# Patient Record
Sex: Female | Born: 1985
Health system: Southern US, Community
[De-identification: ages and names within clinical notes are randomized; demographics above are authoritative.]

## PROBLEM LIST (undated history)

## (undated) DIAGNOSIS — C801 Malignant (primary) neoplasm, unspecified: Secondary | ICD-10-CM

## (undated) DIAGNOSIS — M879 Osteonecrosis, unspecified: Secondary | ICD-10-CM

## (undated) DIAGNOSIS — I639 Cerebral infarction, unspecified: Secondary | ICD-10-CM

## (undated) DIAGNOSIS — I10 Essential (primary) hypertension: Secondary | ICD-10-CM

## (undated) DIAGNOSIS — D649 Anemia, unspecified: Secondary | ICD-10-CM

## (undated) DIAGNOSIS — R519 Headache, unspecified: Secondary | ICD-10-CM

## (undated) DIAGNOSIS — D573 Sickle-cell trait: Secondary | ICD-10-CM

## (undated) HISTORY — DX: Sickle-cell trait: D57.3

## (undated) HISTORY — DX: Cerebral infarction, unspecified: I63.9

## (undated) HISTORY — DX: Malignant (primary) neoplasm, unspecified: C80.1

## (undated) HISTORY — PX: NO PAST SURGERIES: SHX2092

---

## 2015-08-14 DIAGNOSIS — I639 Cerebral infarction, unspecified: Secondary | ICD-10-CM

## 2015-08-14 DIAGNOSIS — C58 Malignant neoplasm of placenta: Secondary | ICD-10-CM

## 2015-08-14 HISTORY — PX: LIVER BIOPSY: SHX301

## 2015-08-14 HISTORY — PX: PORTA CATH INSERTION: CATH118285

## 2015-08-14 HISTORY — DX: Malignant neoplasm of placenta: C58

## 2015-08-14 HISTORY — PX: LUNG BIOPSY: SHX232

## 2015-08-14 HISTORY — DX: Cerebral infarction, unspecified: I63.9

## 2016-08-13 HISTORY — PX: PORT-A-CATH REMOVAL: SHX5289

## 2019-08-14 NOTE — L&D Delivery Note (Signed)
OB/GYN Faculty Practice Delivery Note  Janice Stewart is a 34 y.o. G3P1011 s/p vaginal delivery at [redacted]w[redacted]d. She was admitted for post-dates IOL.   ROM: 27h 29m with clear fluid with terminal mec GBS Status: negative Maximum Maternal Temperature: 101.44F (s/p amp + gent prior to delivery)  Labor Progress: Pt was admitted for post-dates IOL. Cytotec was administered and pt was then transitioned to pitocin. AROM for clear fluid and pt was noted to have complete cervical dilation with uncomplicated delivery as noted below. Pt received ampicillin and gentamycin prior to delivery given clinically diagnosis of chorioamnionitis prior to delivery given maternal fever to 101.44F.  Delivery Date/Time: 2205 Delivery: Called to room and patient was complete and pushing. Head delivered LOA. No nuchal cord present. Shoulder and body delivered in usual fashion. Infant with spontaneous cry, placed on mother's abdomen, dried and stimulated. Cord clamped x 2 after 1-minute delay, and cut by FOB under my direct supervision. Cord blood drawn. Placenta delivered spontaneously with gentle cord traction. Fundus firm with massage and Pitocin. Labia, perineum, vagina, and cervix were inspected, notable for first degree laceration x1.   Placenta: intact, sent to path given pt's h/o choriocarcinoma Complications: none Lacerations: 1st degree perineal laceration s/p repair in standard fashion with 3-0 vicryl EBL: 75 ml Analgesia: epidural  Infant: female  APGARs 8 & 9  weight per medical record  Guillermina [place], MD OB/GYN Fellow, Faculty Practice

## 2020-01-25 ENCOUNTER — Encounter: Payer: Self-pay | Admitting: *Deleted

## 2020-01-25 ENCOUNTER — Other Ambulatory Visit (HOSPITAL_COMMUNITY)
Admission: RE | Admit: 2020-01-25 | Discharge: 2020-01-25 | Disposition: A | Discharge status: Home / Self Care | Payer: Self-pay | Source: Ambulatory Visit | Attending: Obstetrics and Gynecology | Admitting: Obstetrics and Gynecology

## 2020-01-25 ENCOUNTER — Ambulatory Visit (INDEPENDENT_AMBULATORY_CARE_PROVIDER_SITE_OTHER): Payer: Self-pay | Admitting: Obstetrics and Gynecology

## 2020-01-25 ENCOUNTER — Other Ambulatory Visit: Payer: Self-pay

## 2020-01-25 ENCOUNTER — Encounter: Payer: Self-pay | Admitting: Obstetrics and Gynecology

## 2020-01-25 DIAGNOSIS — Z3A16 16 weeks gestation of pregnancy: Secondary | ICD-10-CM

## 2020-01-25 DIAGNOSIS — O0289 Other abnormal products of conception: Secondary | ICD-10-CM | POA: Insufficient documentation

## 2020-01-25 DIAGNOSIS — O0990 Supervision of high risk pregnancy, unspecified, unspecified trimester: Secondary | ICD-10-CM | POA: Insufficient documentation

## 2020-01-25 DIAGNOSIS — Z8759 Personal history of other complications of pregnancy, childbirth and the puerperium: Secondary | ICD-10-CM

## 2020-01-25 DIAGNOSIS — C58 Malignant neoplasm of placenta: Secondary | ICD-10-CM | POA: Insufficient documentation

## 2020-01-25 DIAGNOSIS — Z8673 Personal history of transient ischemic attack (TIA), and cerebral infarction without residual deficits: Secondary | ICD-10-CM

## 2020-01-25 MED ORDER — PRENATAL PLUS 27-1 MG PO TABS: 1.0000 | ORAL_TABLET | Freq: Every day | ORAL | 11 refills | Status: DC

## 2020-01-25 MED ORDER — PRENATAL PLUS 27-1 MG PO TABS: 1.0000 | ORAL_TABLET | Freq: Every day | ORAL | 6 refills | Status: DC

## 2020-01-25 MED ORDER — ASPIRIN EC 81 MG PO TBEC: 81.0000 mg | DELAYED_RELEASE_TABLET | Freq: Every day | ORAL | 2 refills | Status: DC

## 2020-01-25 NOTE — Progress Notes (Signed)
Subjective:  Janice Stewart is a 34 y.o. G4P1011 at 107w1dbeing seen today for first OB visit. EDD by second trimester U/S. [place] TSVD in the past without problems.H/O choriocarcinoma and CVA both in 2017. See notation in problem list. She is currently monitored for the following issues for this high-risk pregnancy and has Supervision of high risk pregnancy, antepartum; History of molar pregnancy; and History of CVA (cerebrovascular accident) on their problem list.  Patient reports no complaints.  Contractions: Not present. Vag. Bleeding: None.  Movement: Present. Denies leaking of fluid.   The following portions of the patient's history were reviewed and updated as appropriate: allergies, current medications, past family history, past medical history, past social history, past surgical history and problem list. Problem list updated.  Objective:   Vitals:   01/25/20 1406 01/25/20 1415  BP: 110/71   Pulse: 85   Weight: 196 lb 9.6 oz (89.2 kg)   Height:  '5\' 9"'  (1.753 m)    Fetal Status: Fetal Heart Rate (bpm): 150   Movement: Present     General:  Alert, oriented and cooperative. Patient is in no acute distress.  Skin: Skin is warm and dry. No rash noted.   Cardiovascular: Normal heart rate noted  Respiratory: Normal respiratory effort, no problems with respiration noted  Abdomen: Soft, gravid, appropriate for gestational age. Pain/Pressure: Present     Pelvic:  Cervical exam performed        Extremities: Normal range of motion.  Edema: None  Mental Status: Normal mood and affect. Normal behavior. Normal judgment and thought content.   Urinalysis:      Assessment and Plan:  Pregnancy: G4P1011 at 144w1d1. Supervision of high risk pregnancy, antepartum Prenatal care. Labs and genetic testing reviewed with pt. - [facility] AMB BABYSCRIPTS SCHEDULE OPTIMIZATION - Culture, OB Urine - CBC/D/Plt+RPR+Rh+ABO+Rub Ab... - [place] OB DETAIL +14 WK; Future - Genetic  Screening - Cytology - PAP( [facility]) - prenatal vitamin w/FE, FA (PRENATAL 1 + 1) 27-1 MG TABS tablet; Take 1 tablet by mouth daily at 12 noon.  Dispense: 30 tablet; Refill: 11  2. History of molar pregnancy See notes in problem list - Comp Met (CMET) - TSH - Protein / creatinine ratio, urine  3. History of CVA (cerebrovascular accident)  - Comp Met (CMET) - TSH - aspirin EC 81 MG tablet; Take 1 tablet (81 mg total) by mouth daily. Take after 12 weeks for prevention of preeclampsia later in pregnancy  Dispense: 300 tablet; Refill: 2 - Protein / creatinine ratio, urine  Preterm labor symptoms and general obstetric precautions including but not limited to vaginal bleeding, contractions, leaking of fluid and fetal movement were reviewed in detail with the patient. Please refer to After Visit Summary for other counseling recommendations.  Return in about 4 weeks (around 02/22/2020) for face to face, MD only, OB visit.   ErChancy MilroyMD

## 2020-01-25 NOTE — Patient Instructions (Addendum)
AREA PEDIATRIC/FAMILY PRACTICE PHYSICIANS  [place]outheast [place] ([address]) . [facility] o Chambliss, MD; Eniola, MD; Hale, MD; Hensel, MD; McDiarmid, MD; McIntyer, MD; Para Cossey, MD; Walden, MD o [address] [place], [place] [address] o [phone] o Mon-Fri 8:30-12:30, 1:30-5:00 o Providers come to see babies at [facility] o Accepting Medicaid . [facility] o Limited providers who accept newborns: Koirala, MD; Morrow, MD; Wolters, MD o [address], [place], [place] [address] o [phone] o Mon-Fri 8:00-5:30 o Babies seen by providers at [facility] o Does NOT accept Medicaid o Please call early in hospitalization for appointment (limited availability)  . [facility] o Mulberry, MD o [address] [place], [place] [address] o [phone] o Mon, Tue, Thur, Fri 8:30-5:00, Wed 10:00-7:00 (closed 1-2pm) o Babies seen by [facility] providers o Accepting Medicaid . [facility] - Pediatrician o Rubin, MD o [address], [place], [place] [address] o [phone] o Mon-Fri 8:30-5:00, Sat 8:30-12:00 o Provider comes to see babies at [facility] o Accepting Medicaid o Must have been referred from current patients or contacted office prior to delivery . [facility] ([facility]) o Brown, MD; Chandler, MD; Ettefagh, MD; Grant, MD; Lester, MD; McCormick, MD; McQueen, MD; Prose, MD; Simha, MD; Stanley, MD; Stryffeler, NP; Tebben, NP o [address], [place], [place] [address] o [phone] o Mon, Tue, Thur, Fri 8:30-5:30, Wed 9:30-5:30, Sat 8:30-12:30 o Babies seen by [facility] providers o Accepting Medicaid o Only accepting infants of first-time parents or siblings of current patients o Hospital discharge coordinator will make follow-up appointment . Jack Amos o [address],  [place], [place]  [address] o [phone]   Fax - [phone] . [facility] o [address], [place], [place]  [address] o Phone - [phone]   Fax - [phone] . Shilpa Gosrani o [address], [place], [place]  [address] o [phone]  [place] ([address]) . [facility] o Bates, MD; [place], MD; Cooper, Cox, MD; MD; Davis, MD; Dovico, MD; Ettefaugh, MD; Little, MD; Lowe, MD; Keiffer, MD; Melvin, MD; Sumner, MD; Williams, MD o [address], [place], [place] [address] o [phone] o Mon-Fri 8:30-5:00 (extended evenings Mon-Thur as needed), Sat-Sun 10:00-1:00 o Providers come to see babies at [facility] o Accepting Medicaid for families of first-time babies and families with all children in the household age 3 and under. Must register with office prior to making appointment (M-F only). . [facility] o Henson, NP; Knapp, MD; Lalonde, MD; Tysinger, PA o [address] [place], [place] [address] o [phone] o Mon-Fri 8:00-5:00 o Babies seen by providers at [facility] o Does NOT accept Medicaid/Commercial Insurance Only . [facility] ([facility])  o Artis, MD; Barnes, MD; Bratton, MD; Coccaro, MD; Lockett Gardner, MD; Kramer, MD; Marshall, MD; Netherton, MD; Poleto, MD; Skinner, MD o [address] [place], [place] [address] o [phone] o Mon-Fri 8:30-5:30, Sat (Oct.-Mar.) 9:00-1:00 o Babies seen by providers at [facility] o Accepting Medicaid  [place] ([address]) . [facility] o Reid, MD; Warner, MD o [address], [place], [place] [address] o [phone] o Mon-Fri 8:30-5:00, Sat 8:30-12:00 o Providers come to see babies at [facility] o Does NOT accept Medicaid . [facility] o Becker, PA; Hagler, MD; Scifres, PA; Sun, MD; Swayne, MD o [address],  [place], [place] [address] o [phone] o Mon-Fri 8:00-5:00 o Babies seen by providers at [facility] o Does NOT accept Medicaid o Only accepting babies of parents who are patients o Please call early in hospitalization for appointment (limited availability) . [facility] o Clark, MD; Frye, MD; Kelleher, MD; Mack, NP; Miller, MD; O'Keller, MD; Patterson, NP; Pudlo, MD; Puzio, MD; Thomas, MD; Tucker, MD; Twiselton, MD o [address], [place], [place] [address] o [phone] o Mon-Fri 8:00-5:00, Sat 9:00-12:00 o Providers come to see babies at [facility] o Does NOT accept Medicaid  [place] ([address]) . [facility] o Limited providers accepting new patients: Brake, NP; Wharton, PA o [address], [place], [place] [address] o [phone] o Mon-Fri 8:00-5:00 o Babies seen by providers at [facility] o Does NOT accept Medicaid o Only accepting babies of parents who are patients o Please call early in hospitalization for appointment (limited availability) . [facility] o Gay, MD; Quinlan, MD o [address] [place], [place] [address] o [phone] (press 1 to schedule appointment) o Mon-Fri 8:00-5:00 o Providers come to see babies at [facility] o Does NOT accept Medicaid . [facility] o Mazer, MD o [address] [place], [place] [address] o [phone] o Mon-Fri 8:30-5:00 (lunch 12:30-1:00), extended hours by appointment only Wed 5:00-6:30 o Babies seen by [facility] providers o Accepting Medicaid . [facility] o Banks, MD; Jordan, MD; Koberlein, MD o [address], [place], [place] [address] o [phone] o Mon-Fri 8:00-5:00 o Babies seen by [facility] providers o Does NOT accept Medicaid .  [facility] o Parker, MD; Hunter, MD; Wallace, DO o [address] [place], [place]  [address] o [phone] o Mon-Fri 8:00-5:00 o Babies seen by [facility] providers o Does NOT accept Medicaid . [facility] o Brandon, PA; Brecken, PA; Christy, NP; Dees, MD; DeClaire, MD; DeWeese, MD; Hansen, NP; Mills, NP; Parrish, NP; Smoot, NP; Summer, MD; Vapne, MD o [address] [place], [place] [address] o [phone] o Mon-Fri 8:30-5:00, Sat 10:00-1:00 o Providers come to see babies at [facility] o Does NOT accept Medicaid o Free prenatal information session Tuesdays at 4:45pm . [facility] o Bouska, MD; Gordon, PA; Jeffery, PA; Weber, PA o [address] [place] [address] o [phone] o Mon-Fri 7:30-5:30 o Babies seen by [facility] providers . [facility] o [address], [place], [place]  [address] o [phone]   Fax - [phone]  [place] ([address] & [address]) . [facility] o Reese, MD o [address] [place], [place] [address] o [phone] o Mon-Thur 8:00-6:00 o Providers come to see babies at [facility] o Accepting Medicaid . [facility] o Anderson, NP; Badger, MD; Beal, PA; Spencer, PA o [address] [place], [place] [address] o [phone] o Mon-Thur 7:30-7:30, Fri 7:30-4:30 o Babies seen by [facility] providers o Accepting Medicaid . [facility] o Agbuya, MD; Klett, NP; Romgoolam, MD o [address], [place],  [address] o [phone] o Mon-Fri 8:30-5:00, Sat 8:30-12:00 o Providers come to see babies at [facility] o Accepting Medicaid o Must have "Meet & Greet" appointment at office prior to delivery . [facility] ) o McCord,   MD; Juleen China, MD; Clydene Laming, [place] [address], [place], [place] [address] o [phone] o Mon-Wed 8:00-6:00, Thur-Fri 8:00-5:00, Sat 9:00-12:00 o Providers come to  see babies at [facility] o Does NOT accept Medicaid o Only accepting siblings of current patients . [facility] [place], [place], [place], [place]  [address] o [phone]   Fax [phone] . [facility] [place] [address], [place], [place]  [address] o [phone]   Fax - Morton Gorman [phone] & [phone]) . Therapist, music at [place], DO; [place], [address] [place], [place] [address] o [phone] o Mon-Fri 7:00-5:00 o Babies seen by [facility] providers o Does NOT accept Medicaid . [place], MD; [place], [place]; [place], [place], [place], [place] [address] o [phone] o Mon-Fri 8:00-5:00 o Babies seen by [facility] providers o Accepting Medicaid . Lamont, MD; [place], [place]; [place], NP; [place], [place], [place], [place] [address] o [phone] o Mon-Fri 8:00-5:00 o Babies seen by providers at [facility] [place] [phone]) . [facility], [place] o [address] [place], [place] [address] o [phone] o Mon-Fri 8:00-5:00 o Babies seen by [facility] providers o Does NOT accept Medicaid o Limited availability, please call early in hospitalization to schedule follow-up . [facility] Leilani Merl, PA; Maisie Fus, MD; [place], MD; [place], [place]; Jeannine Kitten, MD; [place], [place] [facility] [address], [place], [place] [address] o [phone] o Mon-Fri 8:30-5:00, Sat 9:00-12:00 o Babies seen by providers at [facility] o Accepting Medicaid o Please register online then schedule online or call office o [email or web address] . [place] ([facility]  [place]) Kristian Covey, NP; Dwyane Dee, MD; Leonidas Romberg, PA o [address] [place], [place], [place] [address] o [phone] o Mon-Fri 8:00-5:00 o Babies seen by providers at [facility] o Accepting Medicaid . [place] ([facility]) Dairl Ponder, MD; Rayvon Char, NP; Melina [place], MD o [address] [place], [place], [place] [address] o [phone] o Mon-Fri 8:00-5:30, Sat&Sun by appointment (phones open at 8:30) o Babies seen by [facility] providers o Accepting Medicaid o Must be a first-time baby or sibling of current patient . [place], [address], [place], [place]  [address] o [phone]   Fax - [phone]  [place] [phone] & [phone]) . [place], [place]; [place], [place]; Benjamine Mola, MD; [place], [place]; [place] Lark, MD o [address] [place], [place] [address] o [phone] o Mon-Thur 8:00-7:00, Fri 8:00-5:00, Sat 8:00-12:00, Sun 9:00-12:00 o Babies seen by [facility] providers o Accepting Medicaid . [facility], MD; Ruthann Cancer, MD; [facility], MD o [address], [place], [place] [address] o [phone] o Mon-Thur 8:00-5:00 o Babies seen by providers at [facility] o Accepting Medicaid . [facility] [place], MD; Coe[place]Goins, MD; Amedeo Plenty, MD; Bobby Rumpf, MD; List, MD; Lavonia Drafts, MD; Ruthann Cancer, MD; Selinda Eon, MD; Audie Box, MD; Jim Like, MD; Christie Nottingham, MD; Hubbard Hartshorn, MD; [place] Nunnery, MD o [address] [place], [place]  [address] o [phone] o Mon-Fri 8:00-5:30, Sat (Oct.-Mar.) 9:00-1:00 o Babies seen by providers at [facility] o Accepting Medicaid o Must fill out new patient packet, available online at [email or web address] . [place] ([facility]) Barnabas Lister, NP; [place] Kingfisher, NP; Claiborne Billings, NP; Rolla Plate, MD;  [place], [place]; Carola Rhine, MD; Tyron Russell, MD; Delia Chimes, NP o [address], [place], [place] [address] o [phone] o Mon-Thur 8:00-5:30, Fri 8:00-5:00 o Babies seen by providers at [place] [phone]) . [place], [place]; [place], MD; Dennard Schaumann, MD; [place], [place] o [address] [place], [place] [address] o [phone] o Mon-Fri 8:00-5:00 o Babies seen by providers at [place] [phone]) . [place] [facility] [place], [place]; Olen Pel, MD; [place], [place], [place], [place] [address] o [phone] o Mon-Fri 8:00-5:00 o Babies seen by providers at [facility] o Does NOT accept Medicaid o Limited appointment availability, please call early in hospitalization  . Therapist, music at [place], [place]; [place], [address], [place], [place] [address] o [phone] o Mon-Fri 8:00-5:00 o Babies seen by [facility] providers o Does NOT accept Medicaid . [facility] Su Grand, MD; Guy Sandifer, MD; [place], [place]; [place], [place] [address], [place], [place] [address] o [phone] o Mon-Fri 8:00-5:00 o After hours clinic [facility] [place], [place] [address]) [phone] Mon-Fri 5:00-8:00, Sat 12:00-6:00, Sun 10:00-4:00 o Babies seen by [facility] providers o Accepting Medicaid . [place] [facility] o [address], [place], [place]  [address] o [phone]   Fax - [phone]  [place] [phone]) . Therapist, music at [facility], MD o [address] [place], [place], [place] [address] o [phone] o Mon-Fri 8:00-5:00 o Babies seen by [facility] providers o Does NOT accept Medicaid . [place] ([facility]) Bing Neighbors, MD o [address] [place], [place], [place]  [address] o [phone] o Mon-Thur 8:00-7:00, Fri 8:00-5:00, Sat 8:00-12:00 o Babies seen by providers at [facility] o Accepting Medicaid - but does not have vaccinations in office (must be received elsewhere) o Limited availability, please call early in hospitalization  [place] ([address]) . [place], MD o [address], [place] [address] o [phone]  Fax [phone]  Second Trimester of Pregnancy The second trimester is from week 14 through week 27 (months 4 through 6). The second trimester is often a time when you feel your best. Your body has adjusted to being pregnant, and you begin to feel better physically. Usually, morning sickness has lessened or quit completely, you may have more energy, and you may have an increase in appetite. The second trimester is also a time when the fetus is growing rapidly. At the end of the sixth month, the fetus is about 9 inches long and weighs about 1 pounds. You will likely begin to feel the baby move (quickening) between 16 and 20 weeks of pregnancy. Body changes during your second trimester Your body continues to go through many changes during your second trimester. The changes vary from woman to woman. Your weight will continue to increase. You will notice your lower abdomen bulging out. You may begin to get stretch marks on your  hips, abdomen, and breasts. You may develop headaches that can be relieved by medicines. The medicines should be approved by your health care provider. You may urinate more often because the fetus is pressing on your bladder. You may develop or continue to have heartburn as a result of your pregnancy. You may develop constipation because certain hormones are causing the muscles that push waste through your intestines to slow down. You may develop hemorrhoids or swollen, bulging veins (varicose veins). You may have back pain. This is caused by: Weight gain. Pregnancy  hormones that are relaxing the joints in your pelvis. A shift in weight and the muscles that support your balance. Your breasts will continue to grow and they will continue to become tender. Your gums may bleed and may be sensitive to brushing and flossing. Dark spots or blotches (chloasma, mask of pregnancy) may develop on your face. This will likely fade after the baby is born. A dark line from your belly button to the pubic area (linea nigra) may appear. This will likely fade after the baby is born. You may have changes in your hair. These can include thickening of your hair, rapid growth, and changes in texture. Some women also have hair loss during or after pregnancy, or hair that feels dry or thin. Your hair will most likely return to normal after your baby is born. What to expect at prenatal visits During a routine prenatal visit: You will be weighed to make sure you and the fetus are growing normally. Your blood pressure will be taken. Your abdomen will be measured to track your baby's growth. The fetal heartbeat will be listened to. Any test results from the previous visit will be discussed. Your health care provider may ask you: How you are feeling. If you are feeling the baby move. If you have had any abnormal symptoms, such as leaking fluid, bleeding, severe headaches, or abdominal cramping. If you are using any tobacco products, including cigarettes, chewing tobacco, and electronic cigarettes. If you have any questions. Other tests that may be performed during your second trimester include: Blood tests that check for: Low iron levels (anemia). High blood sugar that affects pregnant women (gestational diabetes) between 61 and 28 weeks. Rh antibodies. This is to check for a protein on red blood cells (Rh factor). Urine tests to check for infections, diabetes, or protein in the urine. An ultrasound to confirm the proper growth and development of the baby. An amniocentesis to  check for possible genetic problems. Fetal screens for spina bifida and Down syndrome. HIV (human immunodeficiency virus) testing. Routine prenatal testing includes screening for HIV, unless you choose not to have this test. Follow these instructions at home: Medicines Follow your health care provider's instructions regarding medicine use. Specific medicines may be either safe or unsafe to take during pregnancy. Take a prenatal vitamin that contains at least 600 micrograms (mcg) of folic acid. If you develop constipation, try taking a stool softener if your health care provider approves. Eating and drinking  Eat a balanced diet that includes fresh fruits and vegetables, whole grains, good sources of protein such as meat, eggs, or tofu, and low-fat dairy. Your health care provider will help you determine the amount of weight gain that is right for you. Avoid raw meat and uncooked cheese. These carry germs that can cause birth defects in the baby. If you have low calcium intake from food, talk to your health care provider about whether you should take a daily calcium supplement.  Limit foods that are high in fat and processed sugars, such as fried and sweet foods. To prevent constipation: Drink enough fluid to keep your urine clear or pale yellow. Eat foods that are high in fiber, such as fresh fruits and vegetables, whole grains, and beans. Activity Exercise only as directed by your health care provider. Most women can continue their usual exercise routine during pregnancy. Try to exercise for 30 minutes at least 5 days a week. Stop exercising if you experience uterine contractions. Avoid heavy lifting, wear low heel shoes, and practice good posture. A sexual relationship may be continued unless your health care provider directs you otherwise. Relieving pain and discomfort Wear a good support bra to prevent discomfort from breast tenderness. Take warm sitz baths to soothe any pain or discomfort  caused by hemorrhoids. Use hemorrhoid cream if your health care provider approves. Rest with your legs elevated if you have leg cramps or low back pain. If you develop varicose veins, wear support hose. Elevate your feet for 15 minutes, 3-4 times a day. Limit salt in your diet. Prenatal Care Write down your questions. Take them to your prenatal visits. Keep all your prenatal visits as told by your health care provider. This is important. Safety Wear your seat belt at all times when driving. Make a list of emergency phone numbers, including numbers for family, friends, the hospital, and police and fire departments. General instructions Ask your health care provider for a referral to a local prenatal education class. Begin classes no later than the beginning of month 6 of your pregnancy. Ask for help if you have counseling or nutritional needs during pregnancy. Your health care provider can offer advice or refer you to specialists for help with various needs. Do not use hot tubs, steam rooms, or saunas. Do not douche or use tampons or scented sanitary pads. Do not cross your legs for long periods of time. Avoid cat litter boxes and soil used by cats. These carry germs that can cause birth defects in the baby and possibly loss of the fetus by miscarriage or stillbirth. Avoid all smoking, herbs, alcohol, and unprescribed drugs. Chemicals in these products can affect the formation and growth of the baby. Do not use any products that contain nicotine or tobacco, such as cigarettes and e-cigarettes. If you need help quitting, ask your health care provider. Visit your dentist if you have not gone yet during your pregnancy. Use a soft toothbrush to brush your teeth and be gentle when you floss. Contact a health care provider if: You have dizziness. You have mild pelvic cramps, pelvic pressure, or nagging pain in the abdominal area. You have persistent nausea, vomiting, or diarrhea. You have a bad  smelling vaginal discharge. You have pain when you urinate. Get help right away if: You have a fever. You are leaking fluid from your vagina. You have spotting or bleeding from your vagina. You have severe abdominal cramping or pain. You have rapid weight gain or weight loss. You have shortness of breath with chest pain. You notice sudden or extreme swelling of your face, hands, ankles, feet, or legs. You have not felt your baby move in over an hour. You have severe headaches that do not go away when you take medicine. You have vision changes. Summary The second trimester is from week 14 through week 27 (months 4 through 6). It is also a time when the fetus is growing rapidly. Your body goes through many changes during pregnancy. The changes  vary from woman to woman. Avoid all smoking, herbs, alcohol, and unprescribed drugs. These chemicals affect the formation and growth your baby. Do not use any tobacco products, such as cigarettes, chewing tobacco, and e-cigarettes. If you need help quitting, ask your health care provider. Contact your health care provider if you have any questions. Keep all prenatal visits as told by your health care provider. This is important. This information is not intended to replace advice given to you by your health care provider. Make sure you discuss any questions you have with your health care provider. Document Revised: 11/21/2018 Document Reviewed: 09/04/2016 [employer] Patient Education  [employer].

## 2020-01-26 LAB — HCV INTERPRETATION

## 2020-01-26 LAB — CBC/D/PLT+RPR+RH+ABO+RUB AB...
Antibody Screen: NEGATIVE
Basophils Absolute: 0 10*3/uL (ref 0.0–0.2)
Basos: 0 %
EOS (ABSOLUTE): 0.1 10*3/uL (ref 0.0–0.4)
Eos: 1 %
HCV Ab: <0.1 s/co ratio (ref 0.0–0.9)
HIV Screen 4th Generation wRfx: NONREACTIVE
Hematocrit: 30.3 % — ABNORMAL LOW (ref 34.0–46.6)
Hemoglobin: 10.1 g/dL — ABNORMAL LOW (ref 11.1–15.9)
Hepatitis B Surface Ag: NEGATIVE
Immature Grans (Abs): 0.1 10*3/uL (ref 0.0–0.1)
Immature Granulocytes: 1 %
Lymphocytes Absolute: 1.5 10*3/uL (ref 0.7–3.1)
Lymphs: 18 %
MCH: 28.1 pg (ref 26.6–33.0)
MCHC: 33.3 g/dL (ref 31.5–35.7)
MCV: 84 fL (ref 79–97)
Monocytes Absolute: 0.5 10*3/uL (ref 0.1–0.9)
Monocytes: 7 %
Neutrophils Absolute: 6 10*3/uL (ref 1.4–7.0)
Neutrophils: 73 %
Platelets: 252 10*3/uL (ref 150–450)
RBC: 3.6 x10E6/uL — ABNORMAL LOW (ref 3.77–5.28)
RDW: 13.2 % (ref 11.7–15.4)
RPR Ser Ql: NONREACTIVE
Rh Factor: POSITIVE
Rubella Antibodies, IGG: 4.85 index (ref >0.99)
WBC: 8.2 10*3/uL (ref 3.4–10.8)

## 2020-01-26 LAB — COMPREHENSIVE METABOLIC PANEL
ALT: 10 IU/L (ref 0–32)
AST: 15 IU/L (ref 0–40)
Albumin/Globulin Ratio: 1.4 (ref 1.2–2.2)
Albumin: 4 g/dL (ref 3.8–4.8)
Alkaline Phosphatase: 58 IU/L (ref 48–121)
BUN/Creatinine Ratio: 7 — ABNORMAL LOW (ref 9–23)
BUN: 5 mg/dL — ABNORMAL LOW (ref 6–20)
Bilirubin Total: <0.2 mg/dL (ref 0.0–1.2)
CO2: 18 mmol/L — ABNORMAL LOW (ref 20–29)
Calcium: 9.2 mg/dL (ref 8.7–10.2)
Chloride: 103 mmol/L (ref 96–106)
Creatinine, Ser: 0.73 mg/dL (ref 0.57–1.00)
GFR calc Af Amer: 124 mL/min/{1.73_m2} (ref >59)
GFR calc non Af Amer: 108 mL/min/{1.73_m2} (ref >59)
Globulin, Total: 2.9 g/dL (ref 1.5–4.5)
Glucose: 81 mg/dL (ref 65–99)
Potassium: 4.2 mmol/L (ref 3.5–5.2)
Sodium: 136 mmol/L (ref 134–144)
Total Protein: 6.9 g/dL (ref 6.0–8.5)

## 2020-01-26 LAB — CYTOLOGY - PAP
Chlamydia: NEGATIVE
Comment: NEGATIVE
Comment: NEGATIVE
Comment: NORMAL
Diagnosis: NEGATIVE
High risk HPV: NEGATIVE
Neisseria Gonorrhea: NEGATIVE

## 2020-01-26 LAB — PROTEIN / CREATININE RATIO, URINE
Creatinine, Urine: 92.2 mg/dL
Protein, Ur: 8.8 mg/dL
Protein/Creat Ratio: 95 mg/g creat (ref 0–200)

## 2020-01-26 LAB — TSH: TSH: 0.452 u[IU]/mL (ref 0.450–4.500)

## 2020-01-27 LAB — CULTURE, OB URINE

## 2020-01-27 LAB — URINE CULTURE, OB REFLEX: Organism ID, Bacteria: NO GROWTH

## 2020-02-11 ENCOUNTER — Telehealth (INDEPENDENT_AMBULATORY_CARE_PROVIDER_SITE_OTHER): Payer: Self-pay | Admitting: Lactation Services

## 2020-02-11 ENCOUNTER — Encounter: Payer: Self-pay | Admitting: General Practice

## 2020-02-11 DIAGNOSIS — D573 Sickle-cell trait: Secondary | ICD-10-CM

## 2020-02-11 DIAGNOSIS — O99891 Other specified diseases and conditions complicating pregnancy: Secondary | ICD-10-CM

## 2020-02-11 DIAGNOSIS — O0990 Supervision of high risk pregnancy, unspecified, unspecified trimester: Secondary | ICD-10-CM

## 2020-02-11 NOTE — Telephone Encounter (Signed)
Called patient to let her know that her [facility] Results show that she is a carrier for Sickle Cell Disease. Patient reports she was aware and that her husband and daughter were tested in [place]. Mom is not aware of dad's results but reports her daughter is negative.   Gave mom the number to call [facility] at [phone] to set up a Genetic Counseling Session to discuss results. Patient voiced understanding.

## 2020-02-16 ENCOUNTER — Encounter: Payer: Self-pay | Admitting: General Practice

## 2020-02-22 ENCOUNTER — Encounter: Payer: Self-pay | Admitting: Obstetrics and Gynecology

## 2020-02-22 ENCOUNTER — Other Ambulatory Visit: Payer: Self-pay

## 2020-02-22 ENCOUNTER — Ambulatory Visit (INDEPENDENT_AMBULATORY_CARE_PROVIDER_SITE_OTHER): Payer: Self-pay | Admitting: Obstetrics and Gynecology

## 2020-02-22 VITALS — BP 115/66 | HR 72 | Wt 198.8 lb

## 2020-02-22 DIAGNOSIS — Z8673 Personal history of transient ischemic attack (TIA), and cerebral infarction without residual deficits: Secondary | ICD-10-CM

## 2020-02-22 DIAGNOSIS — O0992 Supervision of high risk pregnancy, unspecified, second trimester: Secondary | ICD-10-CM

## 2020-02-22 DIAGNOSIS — O0990 Supervision of high risk pregnancy, unspecified, unspecified trimester: Secondary | ICD-10-CM

## 2020-02-22 DIAGNOSIS — D573 Sickle-cell trait: Secondary | ICD-10-CM

## 2020-02-22 DIAGNOSIS — Z3A20 20 weeks gestation of pregnancy: Secondary | ICD-10-CM

## 2020-02-22 DIAGNOSIS — Z8759 Personal history of other complications of pregnancy, childbirth and the puerperium: Secondary | ICD-10-CM

## 2020-02-22 NOTE — Patient Instructions (Signed)

## 2020-02-22 NOTE — Progress Notes (Signed)
   PRENATAL VISIT NOTE  Subjective:  Janice Stewart is a 34 y.o. G4P1011 at [redacted]w[redacted]d being seen today for ongoing prenatal care.  She is currently monitored for the following issues for this high-risk pregnancy and has Supervision of high risk pregnancy, antepartum; History of molar pregnancy; History of CVA (cerebrovascular accident); and Sickle cell trait ([place]) on their problem list.  Patient doing well with no acute concerns today. She reports backache.  Contractions: Not present. Vag. Bleeding: None.  Movement: Present. Denies leaking of fluid.   The following portions of the patient's history were reviewed and updated as appropriate: allergies, current medications, past family history, past medical history, past social history, past surgical history and problem list. Problem list updated.  Objective:   Vitals:   02/22/20 0859  BP: 115/66  Pulse: 72  Weight: 198 lb 12.8 oz (90.2 kg)    Fetal Status: Fetal Heart Rate (bpm): 132   Movement: Present     General:  Alert, oriented and cooperative. Patient is in no acute distress.  Skin: Skin is warm and dry. No rash noted.   Cardiovascular: Normal heart rate noted  Respiratory: Normal respiratory effort, no problems with respiration noted  Abdomen: Soft, gravid, appropriate for gestational age.  Pain/Pressure: Present     Pelvic: Cervical exam deferred        Extremities: Normal range of motion.  Edema: None  Mental Status:  Normal mood and affect. Normal behavior. Normal judgment and thought content.   Assessment and Plan:  Pregnancy: G4P1011 at [redacted]w[redacted]d  1. Sickle cell trait ([place]) Per pt she has genetic consult pending  2. Supervision of high risk pregnancy, antepartum  - AMB referral to maternal fetal medicine  3. History of CVA (cerebrovascular accident) Pt is poor historian regarding  Her CVA.  She states it was before her chemotherapy.  She is not sure if it was related to clotting or hypertension.  Pt cannot remember  her treatment regimen as well. - AMB referral to maternal fetal medicine  4. History of molar pregnancy   Preterm labor symptoms and general obstetric precautions including but not limited to vaginal bleeding, contractions, leaking of fluid and fetal movement were reviewed in detail with the patient.  Please refer to After Visit Summary for other counseling recommendations.   Return in about 4 weeks (around 03/21/2020) for [facility], in person.   Lynnda Shields, MD

## 2020-02-26 ENCOUNTER — Ambulatory Visit: Payer: Self-pay | Attending: Obstetrics and Gynecology

## 2020-02-26 ENCOUNTER — Other Ambulatory Visit: Payer: Self-pay

## 2020-02-26 ENCOUNTER — Ambulatory Visit: Payer: Self-pay | Admitting: *Deleted

## 2020-02-26 VITALS — BP 126/67 | HR 87

## 2020-02-26 DIAGNOSIS — Z363 Encounter for antenatal screening for malformations: Secondary | ICD-10-CM

## 2020-02-26 DIAGNOSIS — Z862 Personal history of diseases of the blood and blood-forming organs and certain disorders involving the immune mechanism: Secondary | ICD-10-CM

## 2020-02-26 DIAGNOSIS — O0990 Supervision of high risk pregnancy, unspecified, unspecified trimester: Secondary | ICD-10-CM | POA: Insufficient documentation

## 2020-02-26 DIAGNOSIS — Z8759 Personal history of other complications of pregnancy, childbirth and the puerperium: Secondary | ICD-10-CM

## 2020-02-26 DIAGNOSIS — O09292 Supervision of pregnancy with other poor reproductive or obstetric history, second trimester: Secondary | ICD-10-CM

## 2020-02-26 DIAGNOSIS — Z3A20 20 weeks gestation of pregnancy: Secondary | ICD-10-CM

## 2020-03-24 ENCOUNTER — Ambulatory Visit (INDEPENDENT_AMBULATORY_CARE_PROVIDER_SITE_OTHER): Payer: Self-pay | Admitting: Family Medicine

## 2020-03-24 ENCOUNTER — Other Ambulatory Visit: Payer: Self-pay

## 2020-03-24 ENCOUNTER — Encounter: Payer: Self-pay | Admitting: Family Medicine

## 2020-03-24 VITALS — BP 124/66 | HR 84 | Wt 203.6 lb

## 2020-03-24 DIAGNOSIS — O0990 Supervision of high risk pregnancy, unspecified, unspecified trimester: Secondary | ICD-10-CM

## 2020-03-24 DIAGNOSIS — Z8673 Personal history of transient ischemic attack (TIA), and cerebral infarction without residual deficits: Secondary | ICD-10-CM

## 2020-03-24 NOTE — Patient Instructions (Signed)

## 2020-03-24 NOTE — Progress Notes (Signed)
   PRENATAL VISIT NOTE  Subjective:  Janice Stewart is a 34 y.o. G3P1011 at [redacted]w[redacted]d being seen today for ongoing prenatal care.  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  Patient reports no complaints.  Contractions: Not present. Vag. Bleeding: None.  Movement: Present. Denies leaking of fluid.   The following portions of the patient's history were reviewed and updated as appropriate: allergies, current medications, past family history, past medical history, past social history, past surgical history and problem list.   Objective:   Vitals:   03/24/20 0826  BP: 124/66  Pulse: 84  Weight: 203 lb 9.6 oz (92.4 kg)    Fetal Status: Fetal Heart Rate (bpm): 144 Fundal Height: 23 cm Movement: Present     General:  Alert, oriented and cooperative. Patient is in no acute distress.  Skin: Skin is warm and dry. No rash noted.   Cardiovascular: Normal heart rate noted  Respiratory: Normal respiratory effort, no problems with respiration noted  Abdomen: Soft, gravid, appropriate for gestational age.  Pain/Pressure: Present     Pelvic: Cervical exam deferred        Extremities: Normal range of motion.  Edema: None  Mental Status: Normal mood and affect. Normal behavior. Normal judgment and thought content.   Assessment and Plan:  Pregnancy: G3P1011 at [redacted]w[redacted]d 1. Supervision of high risk pregnancy, antepartum Continue prenatal care.   2. History of CVA (cerebrovascular accident) See problem list, treated with TPA Do not think she needs anti-coagulation as likely related to cancer diagnosis  Preterm labor symptoms and general obstetric precautions including but not limited to vaginal bleeding, contractions, leaking of fluid and fetal movement were reviewed in detail  with the patient. Please refer to After Visit Summary for other counseling recommendations.   Return in 4 weeks (on 04/21/2020) for [facility], in person, 28 wk labs.  No future appointments.  Donnamae Jude, MD

## 2020-04-12 ENCOUNTER — Encounter: Payer: Self-pay | Admitting: *Deleted

## 2020-04-21 ENCOUNTER — Other Ambulatory Visit: Payer: Self-pay

## 2020-04-21 ENCOUNTER — Ambulatory Visit (INDEPENDENT_AMBULATORY_CARE_PROVIDER_SITE_OTHER): Payer: Self-pay | Admitting: Obstetrics and Gynecology

## 2020-04-21 VITALS — BP 110/66 | HR 84 | Wt 214.0 lb

## 2020-04-21 DIAGNOSIS — Z8673 Personal history of transient ischemic attack (TIA), and cerebral infarction without residual deficits: Secondary | ICD-10-CM

## 2020-04-21 DIAGNOSIS — O0289 Other abnormal products of conception: Secondary | ICD-10-CM

## 2020-04-21 DIAGNOSIS — D573 Sickle-cell trait: Secondary | ICD-10-CM

## 2020-04-21 DIAGNOSIS — C58 Malignant neoplasm of placenta: Secondary | ICD-10-CM

## 2020-04-21 DIAGNOSIS — O0990 Supervision of high risk pregnancy, unspecified, unspecified trimester: Secondary | ICD-10-CM

## 2020-04-21 DIAGNOSIS — Z23 Encounter for immunization: Secondary | ICD-10-CM

## 2020-04-21 NOTE — Progress Notes (Signed)
   PRENATAL VISIT NOTE  Subjective:  Janice Stewart is a 34 y.o. G3P1011 at [redacted]w[redacted]d being seen today for ongoing prenatal care.  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  Patient doing well with no acute concerns today. She reports occasional leg cramps.  Contractions: Not present. Vag. Bleeding: None.  Movement: Present. Denies leaking of fluid.   The following portions of the patient's history were reviewed and updated as appropriate: allergies, current medications, past family history, past medical history, past social history, past surgical history and problem list. Problem list updated.  Objective:   Vitals:   04/21/20 0840  BP: 110/66  Pulse: 84  Weight: 214 lb (97.1 kg)    Fetal Status: Fetal Heart Rate (bpm): 140 Fundal Height: 28 cm Movement: Present     General:  Alert, oriented and cooperative. Patient is in no acute distress.  Skin: Skin is warm and dry. No rash noted.   Cardiovascular: Normal heart rate noted  Respiratory: Normal respiratory effort, no problems with respiration noted  Abdomen: Soft, gravid, appropriate for gestational age.  Pain/Pressure: Present     Pelvic: Cervical exam deferred        Extremities: Normal range of motion.  Edema: Trace  Mental Status:  Normal mood and affect. Normal behavior. Normal judgment and thought content.   Assessment and Plan:  Pregnancy: G3P1011 at [redacted]w[redacted]d  1. Supervision of high risk pregnancy, antepartum Pt received flu shot today  2. History of CVA (cerebrovascular accident) No anticoagulation per previous notes  3. Sickle cell trait ([place])   4. Gestational choriocarcinoma [facility]) Check hcg after delivery  Preterm labor symptoms and general obstetric precautions including but not limited to  vaginal bleeding, contractions, leaking of fluid and fetal movement were reviewed in detail with the patient.  Please refer to After Visit Summary for other counseling recommendations.   Return in about 2 weeks (around 05/05/2020) for virtual.   Lynnda Shields, MD

## 2020-04-21 NOTE — Addendum Note (Signed)
Addended by: Michel Harrow on: 04/21/2020 09:30 AM   Modules accepted: Orders

## 2020-04-21 NOTE — Patient Instructions (Signed)

## 2020-04-22 LAB — GLUCOSE TOLERANCE, 2 HOURS W/ 1HR
Glucose, 1 hour: 97 mg/dL (ref 65–179)
Glucose, 2 hour: 82 mg/dL (ref 65–152)
Glucose, Fasting: 73 mg/dL (ref 65–91)

## 2020-04-22 LAB — CBC
Hematocrit: 29.8 % — ABNORMAL LOW (ref 34.0–46.6)
Hemoglobin: 9.8 g/dL — ABNORMAL LOW (ref 11.1–15.9)
MCH: 28.9 pg (ref 26.6–33.0)
MCHC: 32.9 g/dL (ref 31.5–35.7)
MCV: 88 fL (ref 79–97)
Platelets: 223 x10E3/uL (ref 150–450)
RBC: 3.39 x10E6/uL — ABNORMAL LOW (ref 3.77–5.28)
RDW: 13.6 % (ref 11.7–15.4)
WBC: 11.3 x10E3/uL — ABNORMAL HIGH (ref 3.4–10.8)

## 2020-04-22 LAB — HIV ANTIBODY (ROUTINE TESTING W REFLEX): HIV Screen 4th Generation wRfx: NONREACTIVE

## 2020-04-22 LAB — RPR: RPR Ser Ql: NONREACTIVE

## 2020-04-25 ENCOUNTER — Telehealth: Payer: Self-pay | Admitting: *Deleted

## 2020-04-25 DIAGNOSIS — O26899 Other specified pregnancy related conditions, unspecified trimester: Secondary | ICD-10-CM

## 2020-04-25 DIAGNOSIS — O99019 Anemia complicating pregnancy, unspecified trimester: Secondary | ICD-10-CM

## 2020-04-25 MED ORDER — FERROUS SULFATE 325 (65 FE) MG PO TABS: 325.0000 mg | ORAL_TABLET | Freq: Every day | ORAL | 3 refills | Status: DC

## 2020-04-25 MED ORDER — FAMOTIDINE 20 MG PO TABS: 20.0000 mg | ORAL_TABLET | Freq: Two times a day (BID) | ORAL | 3 refills | Status: DC

## 2020-04-25 NOTE — Telephone Encounter (Addendum)
-----   Message from Griffin Basil, MD sent at 04/24/2020  8:26 PM EDT ----- 28 weeks labs WNL, start oral iron   9/13  1203  Called pt and informed her of low iron and need for daily iron supplement. Rx e-prescribed. Pt stated that she has been experiencing heartburn. I recommended TUMS and also will send Rx for Pepcid. She voiced understanding.

## 2020-04-26 ENCOUNTER — Encounter: Payer: Self-pay | Admitting: *Deleted

## 2020-05-05 ENCOUNTER — Other Ambulatory Visit: Payer: Self-pay

## 2020-05-05 ENCOUNTER — Telehealth: Payer: Self-pay | Admitting: *Deleted

## 2020-05-05 ENCOUNTER — Telehealth (INDEPENDENT_AMBULATORY_CARE_PROVIDER_SITE_OTHER): Payer: Self-pay | Admitting: Obstetrics and Gynecology

## 2020-05-05 VITALS — BP 118/61 | HR 75

## 2020-05-05 DIAGNOSIS — O0990 Supervision of high risk pregnancy, unspecified, unspecified trimester: Secondary | ICD-10-CM

## 2020-05-05 DIAGNOSIS — D573 Sickle-cell trait: Secondary | ICD-10-CM

## 2020-05-05 DIAGNOSIS — Z8673 Personal history of transient ischemic attack (TIA), and cerebral infarction without residual deficits: Secondary | ICD-10-CM

## 2020-05-05 NOTE — Telephone Encounter (Signed)
Pt left VM message stating that she had forgotten her video appt today. She would like to reschedule.

## 2020-05-05 NOTE — Progress Notes (Signed)
Called [phone]; VM left stating I am calling to check pt in for virtual appt and will call again. Callback number given.   Pt joined MyChart and provider was able to see pt via MyChart.

## 2020-05-05 NOTE — Patient Instructions (Signed)

## 2020-05-05 NOTE — Progress Notes (Signed)
   OBSTETRICS PRENATAL VIRTUAL VISIT ENCOUNTER NOTE  Provider location: [facility] [place] [facility]   I connected with [name] on 05/05/20 at  8:55 AM EDT by MyChart Video Encounter at home and verified that I am speaking with the correct person using two identifiers.   I discussed the limitations, risks, security and privacy concerns of performing an evaluation and management service virtually and the availability of in person appointments. I also discussed with the patient that there may be a patient responsible charge related to this service. The patient expressed understanding and agreed to proceed. Subjective:  Janice Stewart is a 34 y.o. G3P1011 at [redacted]w[redacted]d being seen today for ongoing prenatal care.  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  Patient reports mild pelvic pressure.  Contractions: Not present. Vag. Bleeding: None.  Movement: Present. Denies any leaking of fluid.   The following portions of the patient's history were reviewed and updated as appropriate: allergies, current medications, past family history, past medical history, past social history, past surgical history and problem list.   Objective:   Vitals:   05/05/20 0922  BP: 118/61  Pulse: 75    Fetal Status:     Movement: Present     General:  Alert, oriented and cooperative. Patient is in no acute distress.  Respiratory: Normal respiratory effort, no problems with respiration noted  Mental Status: Normal mood and affect. Normal behavior. Normal judgment and thought content.  Rest of physical exam deferred due to type of encounter  Imaging: No results found.  Assessment and Plan:  Pregnancy: G3P1011 at [redacted]w[redacted]d 1. Supervision of high risk  pregnancy, antepartum   2. History of CVA (cerebrovascular accident)   3. Sickle cell trait ([place])   Preterm labor symptoms and general obstetric precautions including but not limited to vaginal bleeding, contractions, leaking of fluid and fetal movement were reviewed in detail with the patient. I discussed the assessment and treatment plan with the patient. The patient was provided an opportunity to ask questions and all were answered. The patient agreed with the plan and demonstrated an understanding of the instructions. The patient was advised to call back or seek an in-person office evaluation/go to [facility] at [facility] for any urgent or concerning symptoms. Please refer to After Visit Summary for other counseling recommendations.   I provided 10 minutes of face-to-face time during this encounter.  Return in about 2 weeks (around 05/19/2020) for [facility], in person.  No future appointments.  Griffin Basil, MD Center for [facility], [place]

## 2020-05-20 ENCOUNTER — Ambulatory Visit (INDEPENDENT_AMBULATORY_CARE_PROVIDER_SITE_OTHER): Payer: Self-pay | Admitting: Obstetrics and Gynecology

## 2020-05-20 ENCOUNTER — Other Ambulatory Visit: Payer: Self-pay

## 2020-05-20 ENCOUNTER — Encounter: Payer: Self-pay | Admitting: Obstetrics and Gynecology

## 2020-05-20 VITALS — BP 123/77 | HR 80 | Wt 222.0 lb

## 2020-05-20 DIAGNOSIS — O0289 Other abnormal products of conception: Secondary | ICD-10-CM

## 2020-05-20 DIAGNOSIS — D573 Sickle-cell trait: Secondary | ICD-10-CM

## 2020-05-20 DIAGNOSIS — C58 Malignant neoplasm of placenta: Secondary | ICD-10-CM

## 2020-05-20 DIAGNOSIS — Z8673 Personal history of transient ischemic attack (TIA), and cerebral infarction without residual deficits: Secondary | ICD-10-CM

## 2020-05-20 DIAGNOSIS — O0990 Supervision of high risk pregnancy, unspecified, unspecified trimester: Secondary | ICD-10-CM

## 2020-05-20 NOTE — Progress Notes (Signed)
Subjective:  Janice Stewart is a 33 y.o. G3P1011 at [redacted]w[redacted]d being seen today for ongoing prenatal care.  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  Patient reports no complaints.  Contractions: Irritability. Vag. Bleeding: None.  Movement: Present. Denies leaking of fluid.   The following portions of the patient's history were reviewed and updated as appropriate: allergies, current medications, past family history, past medical history, past social history, past surgical history and problem list. Problem list updated.  Objective:   Vitals:   05/20/20 1015  BP: 123/77  Pulse: 80  Weight: 222 lb (100.7 kg)    Fetal Status: Fetal Heart Rate (bpm): 146   Movement: Present     General:  Alert, oriented and cooperative. Patient is in no acute distress.  Skin: Skin is warm and dry. No rash noted.   Cardiovascular: Normal heart rate noted  Respiratory: Normal respiratory effort, no problems with respiration noted  Abdomen: Soft, gravid, appropriate for gestational age. Pain/Pressure: Present     Pelvic:  Cervical exam deferred        Extremities: Normal range of motion.  Edema: None  Mental Status: Normal mood and affect. Normal behavior. Normal judgment and thought content.   Urinalysis:      Assessment and Plan:  Pregnancy: G3P1011 at [redacted]w[redacted]d  1. Supervision of high risk pregnancy, antepartum Stable   2. Gestational choriocarcinoma ([facility]) Stable BHCG PP  3. Sickle cell trait ([facility]) Stable  4. History of CVA (cerebrovascular accident) Secondary to # 2  Preterm labor symptoms and general obstetric precautions including but not limited to vaginal bleeding, contractions, leaking of fluid and fetal movement were reviewed in detail with the patient. Please  refer to After Visit Summary for other counseling recommendations.  Return in about 2 weeks (around 06/03/2020) for OB visit, virtual.   Chancy Milroy, MD

## 2020-05-20 NOTE — Patient Instructions (Signed)
Third Trimester of Pregnancy The third trimester is from week 28 through week 40 (months 7 through 9). The third trimester is a time when the unborn baby (fetus) is growing rapidly. At the end of the ninth month, the fetus is about 20 inches in length and weighs 6-10 pounds. Body changes during your third trimester Your body will continue to go through many changes during pregnancy. The changes vary from woman to woman. During the third trimester:  Your weight will continue to increase. You can expect to gain 25-35 pounds (11-16 kg) by the end of the pregnancy.  You may begin to get stretch marks on your hips, abdomen, and breasts.  You may urinate more often because the fetus is moving lower into your pelvis and pressing on your bladder.  You may develop or continue to have heartburn. This is caused by increased hormones that slow down muscles in the digestive tract.  You may develop or continue to have constipation because increased hormones slow digestion and cause the muscles that push waste through your intestines to relax.  You may develop hemorrhoids. These are swollen veins (varicose veins) in the rectum that can itch or be painful.  You may develop swollen, bulging veins (varicose veins) in your legs.  You may have increased body aches in the pelvis, back, or thighs. This is due to weight gain and increased hormones that are relaxing your joints.  You may have changes in your hair. These can include thickening of your hair, rapid growth, and changes in texture. Some women also have hair loss during or after pregnancy, or hair that feels dry or thin. Your hair will most likely return to normal after your baby is born.  Your breasts will continue to grow and they will continue to become tender. A yellow fluid (colostrum) may leak from your breasts. This is the first milk you are producing for your baby.  Your belly button may stick out.  You may notice more swelling in your hands,  face, or ankles.  You may have increased tingling or numbness in your hands, arms, and legs. The skin on your belly may also feel numb.  You may feel short of breath because of your expanding uterus.  You may have more problems sleeping. This can be caused by the size of your belly, increased need to urinate, and an increase in your body's metabolism.  You may notice the fetus "dropping," or moving lower in your abdomen (lightening).  You may have increased vaginal discharge.  You may notice your joints feel loose and you may have pain around your pelvic bone. What to expect at prenatal visits You will have prenatal exams every 2 weeks until week 36. Then you will have weekly prenatal exams. During a routine prenatal visit:  You will be weighed to make sure you and the baby are growing normally.  Your blood pressure will be taken.  Your abdomen will be measured to track your baby's growth.  The fetal heartbeat will be listened to.  Any test results from the previous visit will be discussed.  You may have a cervical check near your due date to see if your cervix has softened or thinned (effaced).  You will be tested for Group B streptococcus. This happens between 35 and 37 weeks. Your health care provider may ask you:  What your birth plan is.  How you are feeling.  If you are feeling the baby move.  If you have had any abnormal   symptoms, such as leaking fluid, bleeding, severe headaches, or abdominal cramping.  If you are using any tobacco products, including cigarettes, chewing tobacco, and electronic cigarettes.  If you have any questions. Other tests or screenings that may be performed during your third trimester include:  Blood tests that check for low iron levels (anemia).  Fetal testing to check the health, activity level, and growth of the fetus. Testing is done if you have certain medical conditions or if there are problems during the pregnancy.  Nonstress test  (NST). This test checks the health of your baby to make sure there are no signs of problems, such as the baby not getting enough oxygen. During this test, a belt is placed around your belly. The baby is made to move, and its heart rate is monitored during movement. What is false labor? False labor is a condition in which you feel small, irregular tightenings of the muscles in the womb (contractions) that usually go away with rest, changing position, or drinking water. These are called Braxton Hicks contractions. Contractions may last for hours, days, or even weeks before true labor sets in. If contractions come at regular intervals, become more frequent, increase in intensity, or become painful, you should see your health care provider. What are the signs of labor?  Abdominal cramps.  Regular contractions that start at 10 minutes apart and become stronger and more frequent with time.  Contractions that start on the top of the uterus and spread down to the lower abdomen and back.  Increased pelvic pressure and dull back pain.  A watery or bloody mucus discharge that comes from the vagina.  Leaking of amniotic fluid. This is also known as your "water breaking." It could be a slow trickle or a gush. Let your health care provider know if it has a color or strange odor. If you have any of these signs, call your health care provider right away, even if it is before your due date. Follow these instructions at home: Medicines  Follow your health care provider's instructions regarding medicine use. Specific medicines may be either safe or unsafe to take during pregnancy.  Take a prenatal vitamin that contains at least 600 micrograms (mcg) of folic acid.  If you develop constipation, try taking a stool softener if your health care provider approves. Eating and drinking   Eat a balanced diet that includes fresh fruits and vegetables, whole grains, good sources of protein such as meat, eggs, or tofu,  and low-fat dairy. Your health care provider will help you determine the amount of weight gain that is right for you.  Avoid raw meat and uncooked cheese. These carry germs that can cause birth defects in the baby.  If you have low calcium intake from food, talk to your health care provider about whether you should take a daily calcium supplement.  Eat four or five small meals rather than three large meals a day.  Limit foods that are high in fat and processed sugars, such as fried and sweet foods.  To prevent constipation: ? Drink enough fluid to keep your urine clear or pale yellow. ? Eat foods that are high in fiber, such as fresh fruits and vegetables, whole grains, and beans. Activity  Exercise only as directed by your health care provider. Most women can continue their usual exercise routine during pregnancy. Try to exercise for 30 minutes at least 5 days a week. Stop exercising if you experience uterine contractions.  Avoid heavy lifting.  Do   not exercise in extreme heat or humidity, or at high altitudes.  Wear low-heel, comfortable shoes.  Practice good posture.  You may continue to have sex unless your health care provider tells you otherwise. Relieving pain and discomfort  Take frequent breaks and rest with your legs elevated if you have leg cramps or low back pain.  Take warm sitz baths to soothe any pain or discomfort caused by hemorrhoids. Use hemorrhoid cream if your health care provider approves.  Wear a good support bra to prevent discomfort from breast tenderness.  If you develop varicose veins: ? Wear support pantyhose or compression stockings as told by your healthcare provider. ? Elevate your feet for 15 minutes, 3-4 times a day. Prenatal care  Write down your questions. Take them to your prenatal visits.  Keep all your prenatal visits as told by your health care provider. This is important. Safety  Wear your seat belt at all times when driving.  Make  a list of emergency phone numbers, including numbers for family, friends, the hospital, and police and fire departments. General instructions  Avoid cat litter boxes and soil used by cats. These carry germs that can cause birth defects in the baby. If you have a cat, ask someone to clean the litter box for you.  Do not travel far distances unless it is absolutely necessary and only with the approval of your health care provider.  Do not use hot tubs, steam rooms, or saunas.  Do not drink alcohol.  Do not use any products that contain nicotine or tobacco, such as cigarettes and e-cigarettes. If you need help quitting, ask your health care provider.  Do not use any medicinal herbs or unprescribed drugs. These chemicals affect the formation and growth of the baby.  Do not douche or use tampons or scented sanitary pads.  Do not cross your legs for long periods of time.  To prepare for the arrival of your baby: ? Take prenatal classes to understand, practice, and ask questions about labor and delivery. ? Make a trial run to the hospital. ? Visit the hospital and tour the maternity area. ? Arrange for maternity or paternity leave through employers. ? Arrange for family and friends to take care of pets while you are in the hospital. ? Purchase a rear-facing car seat and make sure you know how to install it in your car. ? Pack your hospital bag. ? Prepare the baby's nursery. Make sure to remove all pillows and stuffed animals from the baby's crib to prevent suffocation.  Visit your dentist if you have not gone during your pregnancy. Use a soft toothbrush to brush your teeth and be gentle when you floss. Contact a health care provider if:  You are unsure if you are in labor or if your water has broken.  You become dizzy.  You have mild pelvic cramps, pelvic pressure, or nagging pain in your abdominal area.  You have lower back pain.  You have persistent nausea, vomiting, or  diarrhea.  You have an unusual or bad smelling vaginal discharge.  You have pain when you urinate. Get help right away if:  Your water breaks before 37 weeks.  You have regular contractions less than 5 minutes apart before 37 weeks.  You have a fever.  You are leaking fluid from your vagina.  You have spotting or bleeding from your vagina.  You have severe abdominal pain or cramping.  You have rapid weight loss or weight gain.  You have   shortness of breath with chest pain.  You notice sudden or extreme swelling of your face, hands, ankles, feet, or legs.  Your baby makes fewer than 10 movements in 2 hours.  You have severe headaches that do not go away when you take medicine.  You have vision changes. Summary  The third trimester is from week 28 through week 40, months 7 through 9. The third trimester is a time when the unborn baby (fetus) is growing rapidly.  During the third trimester, your discomfort may increase as you and your baby continue to gain weight. You may have abdominal, leg, and back pain, sleeping problems, and an increased need to urinate.  During the third trimester your breasts will keep growing and they will continue to become tender. A yellow fluid (colostrum) may leak from your breasts. This is the first milk you are producing for your baby.  False labor is a condition in which you feel small, irregular tightenings of the muscles in the womb (contractions) that eventually go away. These are called Braxton Hicks contractions. Contractions may last for hours, days, or even weeks before true labor sets in.  Signs of labor can include: abdominal cramps; regular contractions that start at 10 minutes apart and become stronger and more frequent with time; watery or bloody mucus discharge that comes from the vagina; increased pelvic pressure and dull back pain; and leaking of amniotic fluid. This information is not intended to replace advice given to you by your  health care provider. Make sure you discuss any questions you have with your health care provider. Document Revised: 11/20/2018 Document Reviewed: 09/04/2016 Elsevier Patient Education  2020 Elsevier Inc.  

## 2020-06-02 ENCOUNTER — Telehealth (INDEPENDENT_AMBULATORY_CARE_PROVIDER_SITE_OTHER): Payer: Self-pay | Admitting: Student

## 2020-06-02 VITALS — BP 116/70 | HR 82

## 2020-06-02 DIAGNOSIS — O0990 Supervision of high risk pregnancy, unspecified, unspecified trimester: Secondary | ICD-10-CM

## 2020-06-02 DIAGNOSIS — Z3A34 34 weeks gestation of pregnancy: Secondary | ICD-10-CM

## 2020-06-02 DIAGNOSIS — O99013 Anemia complicating pregnancy, third trimester: Secondary | ICD-10-CM

## 2020-06-02 DIAGNOSIS — D573 Sickle-cell trait: Secondary | ICD-10-CM

## 2020-06-02 NOTE — Progress Notes (Signed)
I connected with  [name] on 06/02/20 at [place] by MyChart and verified that I am speaking with the correct person using two identifiers.   I discussed the limitations, risks, security and privacy concerns of performing an evaluation and management service by telephone and the availability of in person appointments. I also discussed with the patient that there may be a patient responsible charge related to this service. The patient expressed understanding and agreed to proceed.  [name], RN 06/02/2020  9:19 AM

## 2020-06-02 NOTE — Progress Notes (Signed)
Patient ID: Janice Stewart, female   DOB: 07-15-1986, 34 y.o.   MRN: [id number] I connected with Janice Stewart 06/02/20 at  9:15 AM EDT by: MyChart video and verified that I am speaking with the correct person using two identifiers.  Patient is located at home and provider is located at [facility].   The purpose of this virtual visit is to provide medical care while limiting exposure to the novel coronavirus. I discussed the limitations, risks, security and privacy concerns of performing an evaluation and management service by MyChart video and the availability of in person appointments. I also discussed with the patient that there may be a patient responsible charge related to this service. By engaging in this virtual visit, you consent to the provision of healthcare.  Additionally, you authorize for your insurance to be billed for the services provided during this visit.  The patient expressed understanding and agreed to proceed.  The following staff members participated in the virtual visit:  Louisa Second    PRENATAL VISIT NOTE  Subjective:  Janice Stewart is a 33 y.o. G3P1011 at [redacted]w[redacted]d  for phone visit for ongoing prenatal care.  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  Patient reports no complaints.  Contractions: Irritability. Vag. Bleeding: None.  Movement: Present. Denies leaking of fluid.   The following portions of the patient's history were reviewed and updated as appropriate: allergies, current medications, past family history, past medical history, past social history, past surgical history and problem list.   Objective:   Vitals:   06/02/20 0919  BP: 116/70  Pulse: 82   Self-Obtained  Fetal Status:     Movement: Present     Assessment and  Plan:  Pregnancy: G3P1011 at [redacted]w[redacted]d 1. Supervision of high risk pregnancy, antepartum -confirmed circumcision -confirmed Depo as contraceptive choice -anticipatory guidance given for GBS and GC CT next visit.   Preterm labor symptoms and general obstetric precautions including but not limited to vaginal bleeding, contractions, leaking of fluid and fetal movement were reviewed in detail with the patient.  Return in about 2 weeks (around 06/16/2020), or HROB in two weeks with KK (if space available).  No future appointments.   Time spent on virtual visit: 11 minutes  [place], CNM

## 2020-06-03 ENCOUNTER — Telehealth: Payer: Self-pay | Admitting: Obstetrics and Gynecology

## 2020-06-08 ENCOUNTER — Telehealth: Payer: Self-pay | Admitting: Nurse Practitioner

## 2020-06-15 ENCOUNTER — Ambulatory Visit (INDEPENDENT_AMBULATORY_CARE_PROVIDER_SITE_OTHER): Payer: Self-pay | Admitting: Advanced Practice Midwife

## 2020-06-15 ENCOUNTER — Other Ambulatory Visit: Payer: Self-pay

## 2020-06-15 ENCOUNTER — Other Ambulatory Visit (HOSPITAL_COMMUNITY)
Admission: RE | Admit: 2020-06-15 | Discharge: 2020-06-15 | Disposition: A | Discharge status: Home / Self Care | Payer: Self-pay | Source: Ambulatory Visit | Attending: Advanced Practice Midwife | Admitting: Advanced Practice Midwife

## 2020-06-15 ENCOUNTER — Encounter: Payer: Self-pay | Admitting: Advanced Practice Midwife

## 2020-06-15 VITALS — BP 125/79 | HR 80 | Wt 226.7 lb

## 2020-06-15 DIAGNOSIS — Z3A36 36 weeks gestation of pregnancy: Secondary | ICD-10-CM

## 2020-06-15 DIAGNOSIS — Z3480 Encounter for supervision of other normal pregnancy, unspecified trimester: Secondary | ICD-10-CM

## 2020-06-15 NOTE — Progress Notes (Signed)
Pain in joints in hands

## 2020-06-15 NOTE — Progress Notes (Signed)
   PRENATAL VISIT NOTE  Subjective:  Janice Stewart is a 34 y.o. G3P1011 at [redacted]w[redacted]d being seen today for ongoing prenatal care.  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  Patient reports no complaints.  Contractions: Irritability. Vag. Bleeding: None.  Movement: Present. Denies leaking of fluid.   The following portions of the patient's history were reviewed and updated as appropriate: allergies, current medications, past family history, past medical history, past social history, past surgical history and problem list.   Objective:   Vitals:   06/15/20 1031  BP: 125/79  Pulse: 80  Weight: 226 lb 11.2 oz (102.8 kg)    Fetal Status: Fetal Heart Rate (bpm): 136   Movement: Present     General:  Alert, oriented and cooperative. Patient is in no acute distress.  Skin: Skin is warm and dry. No rash noted.   Cardiovascular: Normal heart rate noted  Respiratory: Normal respiratory effort, no problems with respiration noted  Abdomen: Soft, gravid, appropriate for gestational age.  Pain/Pressure: Present     Pelvic: Cervical exam performed in the presence of a chaperone        Extremities: Normal range of motion.  Edema: Trace  Mental Status: Normal mood and affect. Normal behavior. Normal judgment and thought content.   Assessment and Plan:  Pregnancy: G3P1011 at [redacted]w[redacted]d 1. Supervision of other normal pregnancy, antepartum   2. [redacted] weeks gestation of pregnancy - Culture, beta strep (group b only) - Cervicovaginal ancillary only( [place])  Term labor symptoms and general obstetric precautions including but not limited to vaginal bleeding, contractions, leaking of fluid and fetal movement were reviewed in detail with the patient. Please refer to After Visit  Summary for other counseling recommendations.   Return in about 1 week (around 06/22/2020).  Future Appointments  Date Time Provider [place]  06/23/2020 10:35 AM Helaine Chess [facility]  06/30/2020 10:35 AM Cherre Blanc, MD [facility]  07/06/2020 10:35 AM Jorje Guild, NP [facility] DNP, CNM  06/15/20  11:25 AM

## 2020-06-16 ENCOUNTER — Telehealth: Payer: Self-pay

## 2020-06-16 LAB — CERVICOVAGINAL ANCILLARY ONLY
Chlamydia: NEGATIVE
Comment: NEGATIVE
Comment: NORMAL
Neisseria Gonorrhea: NEGATIVE

## 2020-06-19 LAB — CULTURE, BETA STREP (GROUP B ONLY): Strep Gp B Culture: NEGATIVE

## 2020-06-23 ENCOUNTER — Other Ambulatory Visit: Payer: Self-pay

## 2020-06-23 ENCOUNTER — Ambulatory Visit (INDEPENDENT_AMBULATORY_CARE_PROVIDER_SITE_OTHER): Payer: Self-pay | Admitting: Certified Nurse Midwife

## 2020-06-23 VITALS — BP 120/78 | HR 75 | Wt 226.0 lb

## 2020-06-23 DIAGNOSIS — Z3A37 37 weeks gestation of pregnancy: Secondary | ICD-10-CM

## 2020-06-23 DIAGNOSIS — O0990 Supervision of high risk pregnancy, unspecified, unspecified trimester: Secondary | ICD-10-CM

## 2020-06-23 NOTE — Progress Notes (Signed)
   PRENATAL VISIT NOTE  Subjective:  Janice Stewart is a 34 y.o. G3P1011 at [redacted]w[redacted]d being seen today for ongoing prenatal care.  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  Patient reports no complaints.  Contractions: Irritability. Vag. Bleeding: None.  Movement: Present. Denies leaking of fluid.   The following portions of the patient's history were reviewed and updated as appropriate: allergies, current medications, past family history, past medical history, past social history, past surgical history and problem list.   Objective:   Vitals:   06/23/20 1103  BP: 120/78  Pulse: 75  Weight: 226 lb (102.5 kg)    Fetal Status: Fetal Heart Rate (bpm): 134 Fundal Height: 37 cm Movement: Present     General:  Alert, oriented and cooperative. Patient is in no acute distress.  Skin: Skin is warm and dry. No rash noted.   Cardiovascular: Normal heart rate noted  Respiratory: Normal respiratory effort, no problems with respiration noted  Abdomen: Soft, gravid, appropriate for gestational age.  Pain/Pressure: Present     Pelvic: Cervical exam deferred        Extremities: Normal range of motion.  Edema: Trace  Mental Status: Normal mood and affect. Normal behavior. Normal judgment and thought content.   Assessment and Plan:  Pregnancy: G3P1011 at [redacted]w[redacted]d 1. Supervision of high risk pregnancy, antepartum - Pt doing well, having some mild edema in her lower extremities and starting to have some intermittent hand pain. - Encouraged use of maternity band, increased hydration and protein intake as well as avoidance of excess sugar. Dietary recall = increased intake of sugar including nightly chocolate ice cream. Suggested switching chocolate ice cream out for protein  Ensure shake. Pt agreed to plan.  2. 37 Weeks of Gestation - Anticipatory guidance given about end of pregnancy expectations, symptoms, and scheduling of IOL at her 39wk visit for 41wks.   Term labor symptoms and general obstetric precautions including but not limited to vaginal bleeding, contractions, leaking of fluid and fetal movement were reviewed in detail with the patient. Please refer to After Visit Summary for other counseling recommendations.   Future Appointments  Date Time Provider [place]  06/30/2020 10:35 AM Cherre Blanc, MD [facility]  07/06/2020 10:35 AM Jorje Guild, NP [facility]    Bluford Main, Student-Nurse Midwife  Attestation of Supervision of Student:  I confirm that I have verified the information documented in the nurse midwife student's note and that I have also personally performed the history, physical exam and all medical decision making activities.  I have verified that all services and findings are accurately documented in this student's note; and I agree with management and plan as outlined in the documentation. I have also made any necessary editorial changes.  Gabriel Carina, [place] for [employer], [employer] 06/23/2020 8:49 PM

## 2020-06-30 ENCOUNTER — Ambulatory Visit (INDEPENDENT_AMBULATORY_CARE_PROVIDER_SITE_OTHER): Payer: Self-pay | Admitting: Obstetrics & Gynecology

## 2020-06-30 ENCOUNTER — Encounter: Payer: Self-pay | Admitting: Obstetrics & Gynecology

## 2020-06-30 ENCOUNTER — Other Ambulatory Visit: Payer: Self-pay

## 2020-06-30 VITALS — BP 124/79 | HR 80 | Wt 226.6 lb

## 2020-06-30 DIAGNOSIS — O0990 Supervision of high risk pregnancy, unspecified, unspecified trimester: Secondary | ICD-10-CM

## 2020-06-30 DIAGNOSIS — Z8673 Personal history of transient ischemic attack (TIA), and cerebral infarction without residual deficits: Secondary | ICD-10-CM

## 2020-06-30 DIAGNOSIS — O0289 Other abnormal products of conception: Secondary | ICD-10-CM

## 2020-06-30 DIAGNOSIS — C58 Malignant neoplasm of placenta: Secondary | ICD-10-CM

## 2020-06-30 NOTE — Progress Notes (Signed)
  Patient ID: Janice Stewart, female   DOB: [birth year], 34 y.o.   MRN: [id number]    PRENATAL VISIT NOTE  Subjective:  Janice Stewart is a 34 y.o. G3P1011 at [redacted]w[redacted]d being seen today for ongoing prenatal care.  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  Patient reports no complaints.  Contractions: Irregular. Vag. Bleeding: None.  Movement: Present. Denies leaking of fluid.   The following portions of the patient's history were reviewed and updated as appropriate: allergies, current medications, past family history, past medical history, past social history, past surgical history and problem list.   Objective:   Vitals:   06/30/20 0948  BP: 124/79  Pulse: 80  Weight: 226 lb 9.6 oz (102.8 kg)    Fetal Status: Fetal Heart Rate (bpm): 130   Movement: Present     General:  Alert, oriented and cooperative. Patient is in no acute distress.  Skin: Skin is warm and dry. No rash noted.   Cardiovascular: Normal heart rate noted  Respiratory: Normal respiratory effort, no problems with respiration noted  Abdomen: Soft, gravid, appropriate for gestational age.  Pain/Pressure: Present     Pelvic: Cervical exam deferred        Extremities: Normal range of motion.  Edema: Trace  Mental Status: Normal mood and affect. Normal behavior. Normal judgment and thought content.   Assessment and Plan:  Pregnancy: G3P1011 at [redacted]w[redacted]d 1. Supervision of high risk pregnancy, antepartum   2. 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 History of CVA (cerebrovascular accident) Cont asa  Preterm labor symptoms and general obstetric precautions including but not limited to vaginal bleeding, contractions, leaking of fluid and  fetal movement were reviewed in detail with the patient. Please refer to After Visit Summary for other counseling recommendations.   No follow-ups on file.  Future Appointments  Date Time Provider [place]  06/30/2020 10:35 AM Cherre Blanc, MD [facility]  07/06/2020 10:35 AM Luvenia Redden, PA-C [facility]    Cherre Blanc, MD

## 2020-07-06 ENCOUNTER — Ambulatory Visit (INDEPENDENT_AMBULATORY_CARE_PROVIDER_SITE_OTHER): Payer: Self-pay | Admitting: Medical

## 2020-07-06 ENCOUNTER — Encounter: Payer: Self-pay | Admitting: Medical

## 2020-07-06 ENCOUNTER — Other Ambulatory Visit: Payer: Self-pay

## 2020-07-06 VITALS — BP 125/79 | Temp 77.0°F | Wt 226.5 lb

## 2020-07-06 DIAGNOSIS — Z3A39 39 weeks gestation of pregnancy: Secondary | ICD-10-CM

## 2020-07-06 DIAGNOSIS — O0990 Supervision of high risk pregnancy, unspecified, unspecified trimester: Secondary | ICD-10-CM

## 2020-07-06 DIAGNOSIS — C58 Malignant neoplasm of placenta: Secondary | ICD-10-CM

## 2020-07-06 DIAGNOSIS — Z8673 Personal history of transient ischemic attack (TIA), and cerebral infarction without residual deficits: Secondary | ICD-10-CM

## 2020-07-06 DIAGNOSIS — O0289 Other abnormal products of conception: Secondary | ICD-10-CM

## 2020-07-06 NOTE — Progress Notes (Signed)
   PRENATAL VISIT NOTE  Subjective:  Janice Stewart is a 34 y.o. G3P1011 at [redacted]w[redacted]d being seen today for ongoing prenatal care.  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  Patient reports occasional contractions.  Contractions: Irregular. Vag. Bleeding: None.  Movement: Present. Denies leaking of fluid.   The following portions of the patient's history were reviewed and updated as appropriate: allergies, current medications, past family history, past medical history, past social history, past surgical history and problem list.   Objective:   Vitals:   07/06/20 1115  BP: 125/79  Temp: (!) 77 F (25 C)  Weight: 226 lb 8 oz (102.7 kg)    Fetal Status: Fetal Heart Rate (bpm): 130   Movement: Present     General:  Alert, oriented and cooperative. Patient is in no acute distress.  Skin: Skin is warm and dry. No rash noted.   Cardiovascular: Normal heart rate noted  Respiratory: Normal respiratory effort, no problems with respiration noted  Abdomen: Soft, gravid, appropriate for gestational age.  Pain/Pressure: Present     Pelvic: Cervical exam deferred        Extremities: Normal range of motion.  Edema: Trace  Mental Status: Normal mood and affect. Normal behavior. Normal judgment and thought content.   Assessment and Plan:  Pregnancy: G3P1011 at [redacted]w[redacted]d 1. Supervision of high risk pregnancy, antepartum - Peds list given again and importance of choosing Peds discussed   2. 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 History of CVA (cerebrovascular accident)  4. [redacted] weeks gestation of pregnancy  Term labor symptoms and general obstetric precautions including but not limited to vaginal bleeding, contractions, leaking of  fluid and fetal movement were reviewed in detail with the patient. Please refer to After Visit Summary for other counseling recommendations.   Return in about 1 week (around 07/13/2020) for [facility] MD only, NST/BPP, In-Person.  Future Appointments  Date Time Provider [place]  07/13/2020  8:15 AM [facility] NST [facility]  07/13/2020  9:15 AM Clarnce Flock, MD [facility]    Kerry Hough, PA-C

## 2020-07-06 NOTE — Patient Instructions (Addendum)
Fetal Movement Counts Patient Name: ________________________________________________ Patient Due Date: ____________________ What is a fetal movement count?  A fetal movement count is the number of times that you feel your baby move during a certain amount of time. This may also be called a fetal kick count. A fetal movement count is recommended for every pregnant woman. You may be asked to start counting fetal movements as early as week 28 of your pregnancy. Pay attention to when your baby is most active. You may notice your baby's sleep and wake cycles. You may also notice things that make your baby move more. You should do a fetal movement count:  When your baby is normally most active.  At the same time each day. A good time to count movements is while you are resting, after having something to eat and drink. How do I count fetal movements? 1. Find a quiet, comfortable area. Sit, or lie down on your side. 2. Write down the date, the start time and stop time, and the number of movements that you felt between those two times. Take this information with you to your health care visits. 3. Write down your start time when you feel the first movement. 4. Count kicks, flutters, swishes, rolls, and jabs. You should feel at least 10 movements. 5. You may stop counting after you have felt 10 movements, or if you have been counting for 2 hours. Write down the stop time. 6. If you do not feel 10 movements in 2 hours, contact your health care provider for further instructions. Your health care provider may want to do additional tests to assess your baby's well-being. Contact a health care provider if:  You feel fewer than 10 movements in 2 hours.  Your baby is not moving like he or she usually does. Date: ____________ Start time: ____________ Stop time: ____________ Movements: ____________ Date: ____________ Start time: ____________ Stop time: ____________ Movements: ____________ Date: ____________  Start time: ____________ Stop time: ____________ Movements: ____________ Date: ____________ Start time: ____________ Stop time: ____________ Movements: ____________ Date: ____________ Start time: ____________ Stop time: ____________ Movements: ____________ Date: ____________ Start time: ____________ Stop time: ____________ Movements: ____________ Date: ____________ Start time: ____________ Stop time: ____________ Movements: ____________ Date: ____________ Start time: ____________ Stop time: ____________ Movements: ____________ Date: ____________ Start time: ____________ Stop time: ____________ Movements: ____________ This information is not intended to replace advice given to you by your health care provider. Make sure you discuss any questions you have with your health care provider. Document Revised: 03/19/2019 Document Reviewed: 03/19/2019 Elsevier Patient Education  2020 Elsevier Inc. [employer] of the uterus can occur throughout pregnancy, but they are not always a sign that you are in labor. You may have practice contractions called Braxton Hicks contractions. These false labor contractions are sometimes confused with true labor. What are [place] Circle contractions? Braxton Hicks contractions are tightening movements that occur in the muscles of the uterus before labor. Unlike true labor contractions, these contractions do not result in opening (dilation) and thinning of the cervix. Toward the end of pregnancy (32-34 weeks), Braxton Hicks contractions can happen more often and may become stronger. These contractions are sometimes difficult to tell apart from true labor because they can be very uncomfortable. You should not feel embarrassed if you go to the hospital with false labor. Sometimes, the only way to tell if you are in true labor is for your health care provider to look for changes in the cervix. The health care provider  will do a physical exam and may  monitor your contractions. If you are not in true labor, the exam should show that your cervix is not dilating and your water has not broken. If there are no other health problems associated with your pregnancy, it is completely safe for you to be sent home with false labor. You may continue to have Braxton Hicks contractions until you go into true labor. How to tell the difference between true labor and false labor True labor  Contractions last 30-70 seconds.  Contractions become very regular.  Discomfort is usually felt in the top of the uterus, and it spreads to the lower abdomen and low back.  Contractions do not go away with walking.  Contractions usually become more intense and increase in frequency.  The cervix dilates and gets thinner. False labor  Contractions are usually shorter and not as strong as true labor contractions.  Contractions are usually irregular.  Contractions are often felt in the front of the lower abdomen and in the groin.  Contractions may go away when you walk around or change positions while lying down.  Contractions get weaker and are shorter-lasting as time goes on.  The cervix usually does not dilate or become thin. Follow these instructions at home:   Take over-the-counter and prescription medicines only as told by your health care provider.  Keep up with your usual exercises and follow other instructions from your health care provider.  Eat and drink lightly if you think you are going into labor.  If Braxton Hicks contractions are making you uncomfortable: ? Change your position from lying down or resting to walking, or change from walking to resting. ? Sit and rest in a tub of warm water. ? Drink enough fluid to keep your urine pale yellow. Dehydration may cause these contractions. ? Do slow and deep breathing several times an hour.  Keep all follow-up prenatal visits as told by your health care provider. This is important. Contact a  health care provider if:  You have a fever.  You have continuous pain in your abdomen. Get help right away if:  Your contractions become stronger, more regular, and closer together.  You have fluid leaking or gushing from your vagina.  You pass blood-tinged mucus (bloody show).  You have bleeding from your vagina.  You have low back pain that you never had before.  You feel your babys head pushing down and causing pelvic pressure.  Your baby is not moving inside you as much as it used to. Summary  Contractions that occur before labor are called Braxton Hicks contractions, false labor, or practice contractions.  Braxton Hicks contractions are usually shorter, weaker, farther apart, and less regular than true labor contractions. True labor contractions usually become progressively stronger and regular, and they become more frequent.  Manage discomfort from [facility] contractions by changing position, resting in a warm bath, drinking plenty of water, or practicing deep breathing. This information is not intended to replace advice given to you by your health care provider. Make sure you discuss any questions you have with your health care provider. Document Revised: 07/12/2017 Document Reviewed: 12/13/2016 Elsevier Patient Education  [place] [id number])  [facility] Davy Pique, MD; Gwendlyn Deutscher, MD; Walker Kehr, MD; Andria Frames, MD; McDiarmid, MD; Dutch Quint, MD; Nori Riis, MD; Mingo Amber, [address] [place], [place] [address] o [phone] o Mon-Fri 8:30-12:30, 1:30-5:00 o Providers come to see babies  at [place] [facility] [place] providers who accept newborns: Dorthy Cooler, MD; Orland Mustard, MD; Stephanie Acre, MD o [address], [place], [place] [address] o [phone] o Mon-Fri 8:00-5:30 o Babies seen by providers at  [facility] o Does NOT accept Medicaid o Please call early in hospitalization for appointment (limited availability)   [place], MD o [address] [place], [place] [address] o [phone] o Mon, Tue, Thur, Fri 8:30-5:00, Wed 10:00-7:00 (closed 1-2pm) o Babies seen by [facility] providers o Accepting Medicaid  Os[place], MD o [place], [place], [place] [address] o [phone] o Mon-Fri 8:30-5:00, Sat 8:30-12:00 o Provider comes to see babies at [facility] o Accepting Medicaid o Must have been referred from current patients or contacted office prior to delivery  [facility] ([place] for [place]) Franne Forts, MD; Tamera Punt, MD; Doneen Poisson, MD; Fatima Sanger, MD; Wynetta Emery, MD; Jess Barters, MD; Tami Ribas, MD; Herbert Moors, MD; Derrell Lolling, MD; Dorothyann Peng, MD; Lucious Groves, NP; Baldo Ash, NP o [place], [place], [place] [address] o [phone] o Mon, Tue, Thur, Fri 8:30-5:30, Wed 9:30-5:30, Sat 8:30-12:30 o Babies seen by [facility] providers o Accepting Medicaid o Only accepting infants of first-time parents or siblings of current patients o Hospital discharge coordinator will make follow-up appointment  Baltazar Najjar o [address] [place], [place]  [address] o [phone]   Fax - [phone]  [facility] o [address], [place], [place]  [address] o Phone - [phone]   Fax - [place] o [address], [place], [place], [place]  [address] o [phone]  [place] [phone])  [facility] Reginal Lutes, MD; Jacklynn Ganong, MD; Torrie Mayers, MD; MD; Rosana Hoes, MD; Servando Salina, MD; Rose Fillers, MD; Rex Kras, MD; Corinna Capra, MD; Volney American, MD; Trilby Drummer, MD; Janann Colonel, MD; Jimmye Norman, Leonore Newton, [place], [place] [address] o [phone] o Mon-Fri 8:30-5:00 (extended evenings Mon-Thur as needed), Sat-Sun  10:00-1:00 o Providers come to see babies at [place] Medicaid for families of first-time babies and families with all children in the household age 93 and under. Must register with office prior to making appointment (M-F only).  [place], NP; Tomi Bamberger, MD; Redmond School, MD; [place], [address] [place], [place] [address] o [phone] o Mon-Fri 8:00-5:00 o Babies seen by providers at [facility] o Does NOT accept Medicaid/Commercial Insurance Only  [facility] at [place] [facility])  Marnee Guarneri, MD; Drema Dallas, MD; Montine Circle, MD; Vilma Prader, MD; Vanita Panda, MD; Alfonso Ramus, MD; Ruthann Cancer, MD; Roxanne Mins, MD; Rosalva Ferron, MD; Polly Cobia, MD o [address] [place], [place] [address] o [phone] o Mon-Fri 8:30-5:30, Sat (Oct.-Mar.) 9:00-1:00 o Babies seen by providers at [place] [phone])  [facility] Peers, MD; Suzan Slick, MD o [address], [place], [place] [address] o [phone] o Mon-Fri 8:30-5:00, Sat 8:30-12:00 o Providers come to see babies at [facility] o Does NOT accept Medicaid  [place] at [place], [place]; Mannie Stabile, MD; [place], [place]; Nancy Fetter, MD; Moreen Fowler, [place], [place],  [address] o [phone] o Mon-Fri 8:00-5:00 o Babies seen by providers at [facility] o Does NOT accept Medicaid o Only accepting babies of parents who are patients o Please call  early in hospitalization for appointment (limited availability)  [facility] Blanca Friend, MD; Sharlene Motts, MD; Rod Can, MD; Warner Mccreedy, NP; Sabra Heck, MD; Ermalinda Memos, MD; Sharlett Iles, NP; Aurther Loft, MD; Jerrye Beavers, MD; Marcello Moores, MD; Berline Lopes, MD; Charolette Forward, MD o [address], [place], [place] [address] o [phone] o Mon-Fri 8:00-5:00, Sat 9:00-12:00 o Providers come to see babies at [facility] o Does NOT accept  [facility] [phone])  [place] [facility] [place] providers accepting new patients: Dayna Ramus, NP; Rolland Porter, [place], [place], [place] [address] o [phone] o Mon-Fri 8:00-5:00 o Babies seen by providers at [facility] o Does NOT accept Medicaid o Only accepting babies of parents who are patients o Please call early in hospitalization for appointment (limited availability)  [facility] Oswaldo Conroy, MD; Sheran Lawless, [address] [place], [place] [address] o [phone] (press 1 to schedule appointment) o Mon-Fri 8:00-5:00 o Providers come to see babies at [facility] o Does NOT accept [facility] Jodi Mourning, MD o [address], [place], [place] [address] o [phone] o Mon-Fri 8:30-5:00 (lunch 12:30-1:00), extended hours by appointment only Wed 5:00-6:30 o Babies seen by [place] Medicaid  [place] at Evalyn Casco, MD; [place], MD; Ethlyn Gallery, MD o [place], [place], [place] [address] o [phone] o Mon-Fri 8:00-5:00 o Babies seen by [facility] providers o Does NOT accept Medicaid  [facility] at [place], MD; Yong Channel, MD; [place], [address] [place], [place] [address] o [phone] o Mon-Fri 8:00-5:00 o Babies seen by [facility] providers o Does NOT accept [facility]  [place], [place]; [place], [place]; [place], [place]; Albertina Parr, MD; Frederic Jericho, MD; Ronney Lion, MD; Carlos Levering, NP; Jerelene Redden, NP; Tomasita Crumble, NP; Ronelle Nigh, NP; Corinna Lines, MD; Karsten Ro, MD o [address] [place], [place] [address] o [phone] o Mon-Fri 8:30-5:00, Sat 10:00-1:00 o Providers come to see babies at [facility] o Does NOT accept Medicaid o Free prenatal information session Tuesdays at 4:45pm  [place], MD; [place], [place]; [place], [place]; Gale Journey, [address]., [place] [address] o [phone] o Mon-Fri 7:30-5:30 o Babies seen by [place] o [address], [place], [place], [place]  [address] o [phone]   Fax - [phone]  [place] [phone] & [phone])  [place] o [address] [place], [place] [address] o [phone] o Mon-Thur 8:00-6:00 o Providers come to see babies at [place], NP; Melford [place], MD; [place], [place]; [place], [address] [place], [place] [address] o [phone] o Mon-Thur 7:30-7:30, Fri 7:30-4:30 o Babies seen by [facility] providers o Accepting [facility] Nyra Jabs, MD; Cristino Martes, NP; Gertie Baron, MD o [place] [address], [place], [place] [address] o [phone] o Mon-Fri 8:30-5:00, Sat 8:30-12:00 o Providers come to see babies at [facility] o Accepting Medicaid o Must have Meet & Greet appointment at office prior to delivery  [place] ([place]) Jodene Nam, MD; Juleen China, MD; Clydene Laming, [place] [address], [place],  [address] o [phone] o Mon-Wed 8:00-6:00, Thur-Fri 8:00-5:00, Sat 9:00-12:00 o Providers come to see babies at [facility] o Does NOT accept Medicaid o Only accepting siblings of current patients  [place]  o [address] [place], [place], [place]  [address] o [phone]   Fax - [phone]  [place] at [facility] o [phone] [address], [place], [place]  [address] o [phone]   Fax - [place] [phone] & [phone])  Therapist, music at [place], [place]; [place], [address] [place], [place] [address] o [phone] o Mon-Fri 7:00-5:00 o Babies seen by [facility] providers o Does NOT  accept Medicaid  [place], MD; [place], [place]; [place], [place] [address], [place], [place] [address] o [phone] o Mon-Fri 8:00-5:00 o Babies seen by [facility] providers o Accepting Medicaid  [place], MD; [place], [place]; [place], NP; [place], [place] [address], [place], [place] [address] o [phone] o Mon-Fri 8:00-5:00 o Babies seen by providers at [place] [phone])  [facility] at [place], [place] o [address] [place], [place] [address] o [phone] o Mon-Fri 8:00-5:00 o Babies seen by [facility] providers o Does NOT accept Medicaid o Limited availability, please call early in hospitalization to schedule follow-up  [facility] Leilani Merl, [place]; Maisie Fus, MD; Charlesetta Garibaldi, MD; [place], [place]; Jeannine Kitten, MD; [place], [place] [facility] [address], [place], [place] [address] o [phone] o Mon-Fri 8:30-5:00, Sat 9:00-12:00 o Babies seen by providers at [place] Medicaid o Please register online then schedule online or call office o [email or web address] ([place] at [place]) Kristian Covey, NP; Dwyane Dee, MD; Leonidas Romberg, PA o [address], [place], [place] [address] o [phone] o Mon-Fri 8:00-5:00 o Babies seen by providers at [place] Medicaid  [place] ([facility] [employer]) Dairl Ponder, MD; Rayvon Char, NP; Melina Modena, MD o [address] [place], [place], [place] [address] o [phone] o Mon-Fri 8:00-5:30, Sat&Sun by appointment (phones open at 8:30) o Babies seen by [facility] providers o Accepting Medicaid o Must be a first-time baby or sibling of current patient  [place], [address], [place], [place]  [address] o [phone]   Fax - [phone]  [place] [phone] & [phone])  [place], [place]; [place], [place]; [place], MD; [place], [place]; Harrell Lark, MD o [address] [place], [place] [address] o [phone] o Mon-Thur 8:00-7:00, Fri 8:00-5:00, Sat 8:00-12:00, Sun 9:00-12:00 o Babies seen by [facility] providers o Accepting Medicaid  [facility] at [facility], MD; Ruthann Cancer, MD; [facility], MD o [address], [place], [place] [address] o [phone] o Mon-Thur 8:00-5:00 o Babies seen by providers at [place] Medicaid  [facility] [place] at [place], MD; Co[place], MD; Amedeo Plenty, MD; Bobby Rumpf, MD; [place], MD; Lavonia Drafts, MD; Ruthann Cancer, MD; Selinda Eon, MD; Audie Box, MD; Jim Like, MD; Christie Nottingham, MD; Hubbard Hartshorn, MD; Modena Nunnery, MD o [address] [place], [place] [address] o [phone] o Mon-Fri 8:00-5:30, Sat (Oct.-Mar.) 9:00-1:00 o Babies seen by providers at [facility] o Accepting Medicaid o Must fill out new patient packet, available online at [email or web address]  [place] ([facility]) Barnabas Lister, NP; Kenton Kingfisher, NP; Claiborne Billings, NP; Rolla Plate,  MD; Trilby Drummer, [place]; Carola Rhine, MD; Tyron Russell, MD; Delia Chimes, NP o [address], [place], [place] [address] o [phone] o Mon-Thur 8:00-5:30, Fri 8:00-5:00 o Babies seen by providers at [place] [phone])  [place], [place]; [place], MD; [place], MD; [place], [place] o [address] [place], [place] [address] o [phone] o Mon-Fri 8:00-5:00 o Babies seen by providers at [place] [phone])  [place] at [place], [place]; Olen Pel, MD; [place], [place], [place], [place]  [address] o [phone] o Mon-Fri 8:00-5:00 o Babies seen by providers at [facility] o Does NOT accept Medicaid o Limited appointment availability, please call early in hospitalization   [place] at [place], [place]; [place], [address] [place], [place] [address] o [phone] o Mon-Fri 8:00-5:00 o Babies seen by [facility] providers o Does NOT accept [facility] Su Grand, MD; Guy Sandifer, MD; [place], [place]; Ava, MD o [place] [address], [place], [place] [address] o [phone] o Mon-Fri 8:00-5:00 o After hours clinic [facility] [place], [place] [address]) [phone] Mon-Fri 5:00-8:00, Sat 12:00-6:00, Sun 10:00-4:00 o Babies seen by [facility] providers o Accepting Medicaid  [place] at [facility] o [address] [place], [place]  [address] o [phone]   Fax - [phone]  [place] [phone])  [place] at [place], MD o [address] [place] [address] [place], [place], [place] [address] o [phone] o Mon-Fri 8:00-5:00 o Babies seen by [facility] providers o Does NOT accept Medicaid  [place] ([place] at [place], MD o [address] [place], [place] [address] o [phone] o Mon-Thur 8:00-7:00, Fri 8:00-5:00, Sat 8:00-12:00 o Babies seen by providers at [facility] o Accepting Medicaid - but does not have vaccinations in office (must be received elsewhere) o Limited availability, please call early in hospitalization  [place] [phone])  [place], [place] [address], [place] [address] o [phone]  Fax [phone]

## 2020-07-09 ENCOUNTER — Other Ambulatory Visit: Payer: Self-pay | Admitting: Advanced Practice Midwife

## 2020-07-10 ENCOUNTER — Inpatient Hospital Stay (HOSPITAL_COMMUNITY): Admit: 2020-07-10 | Payer: Self-pay

## 2020-07-11 NOTE — Telephone Encounter (Signed)
Preadmission screen  

## 2020-07-12 ENCOUNTER — Encounter (HOSPITAL_COMMUNITY): Payer: Self-pay | Admitting: *Deleted

## 2020-07-12 ENCOUNTER — Other Ambulatory Visit: Payer: Self-pay | Admitting: Advanced Practice Midwife

## 2020-07-12 ENCOUNTER — Telehealth (HOSPITAL_COMMUNITY): Payer: Self-pay | Admitting: *Deleted

## 2020-07-12 NOTE — Telephone Encounter (Signed)
Preadmission screen  

## 2020-07-13 ENCOUNTER — Ambulatory Visit (INDEPENDENT_AMBULATORY_CARE_PROVIDER_SITE_OTHER): Payer: Medicaid Other | Admitting: Family Medicine

## 2020-07-13 ENCOUNTER — Other Ambulatory Visit: Payer: Self-pay

## 2020-07-13 ENCOUNTER — Encounter: Payer: Self-pay | Admitting: Family Medicine

## 2020-07-13 ENCOUNTER — Other Ambulatory Visit: Payer: Self-pay | Admitting: Advanced Practice Midwife

## 2020-07-13 ENCOUNTER — Ambulatory Visit (INDEPENDENT_AMBULATORY_CARE_PROVIDER_SITE_OTHER): Payer: Medicaid Other

## 2020-07-13 ENCOUNTER — Ambulatory Visit (INDEPENDENT_AMBULATORY_CARE_PROVIDER_SITE_OTHER): Payer: Medicaid Other | Admitting: *Deleted

## 2020-07-13 VITALS — BP 120/72 | HR 91 | Wt 231.0 lb

## 2020-07-13 DIAGNOSIS — Z8673 Personal history of transient ischemic attack (TIA), and cerebral infarction without residual deficits: Secondary | ICD-10-CM

## 2020-07-13 DIAGNOSIS — O480 Post-term pregnancy: Secondary | ICD-10-CM

## 2020-07-13 DIAGNOSIS — C58 Malignant neoplasm of placenta: Secondary | ICD-10-CM

## 2020-07-13 DIAGNOSIS — O0990 Supervision of high risk pregnancy, unspecified, unspecified trimester: Secondary | ICD-10-CM

## 2020-07-13 DIAGNOSIS — D573 Sickle-cell trait: Secondary | ICD-10-CM

## 2020-07-13 DIAGNOSIS — O0289 Other abnormal products of conception: Secondary | ICD-10-CM

## 2020-07-13 NOTE — Patient Instructions (Signed)
Contraception Choices Contraception, also called birth control, refers to methods or devices that prevent pregnancy. Hormonal methods Contraceptive implant  A contraceptive implant is a thin, plastic tube that contains a hormone. It is inserted into the upper part of the arm. It can remain in place for up to 3 years. Progestin-only injections Progestin-only injections are injections of progestin, a synthetic form of the hormone progesterone. They are given every 3 months by a health care provider. Birth control pills  Birth control pills are pills that contain hormones that prevent pregnancy. They must be taken once a day, preferably at the same time each day. Birth control patch  The birth control patch contains hormones that prevent pregnancy. It is placed on the skin and must be changed once a week for three weeks and removed on the fourth week. A prescription is needed to use this method of contraception. Vaginal ring  A vaginal ring contains hormones that prevent pregnancy. It is placed in the vagina for three weeks and removed on the fourth week. After that, the process is repeated with a new ring. A prescription is needed to use this method of contraception. Emergency contraceptive Emergency contraceptives prevent pregnancy after unprotected sex. They come in pill form and can be taken up to 5 days after sex. They work best the sooner they are taken after having sex. Most emergency contraceptives are available without a prescription. This method should not be used as your only form of birth control. Barrier methods Female condom  A female condom is a thin sheath that is worn over the penis during sex. Condoms keep sperm from going inside a woman's body. They can be used with a spermicide to increase their effectiveness. They should be disposed after a single use. Female condom  A female condom is a soft, loose-fitting sheath that is put into the vagina before sex. The condom keeps  sperm from going inside a woman's body. They should be disposed after a single use. Diaphragm  A diaphragm is a soft, dome-shaped barrier. It is inserted into the vagina before sex, along with a spermicide. The diaphragm blocks sperm from entering the uterus, and the spermicide kills sperm. A diaphragm should be left in the vagina for 6-8 hours after sex and removed within 24 hours. A diaphragm is prescribed and fitted by a health care provider. A diaphragm should be replaced every 1-2 years, after giving birth, after gaining more than 15 lb (6.8 kg), and after pelvic surgery. Cervical cap  A cervical cap is a round, soft latex or plastic cup that fits over the cervix. It is inserted into the vagina before sex, along with spermicide. It blocks sperm from entering the uterus. The cap should be left in place for 6-8 hours after sex and removed within 48 hours. A cervical cap must be prescribed and fitted by a health care provider. It should be replaced every 2 years. Sponge  A sponge is a soft, circular piece of polyurethane foam with spermicide on it. The sponge helps block sperm from entering the uterus, and the spermicide kills sperm. To use it, you make it wet and then insert it into the vagina. It should be inserted before sex, left in for at least 6 hours after sex, and removed and thrown away within 30 hours. Spermicides Spermicides are chemicals that kill or block sperm from entering the cervix and uterus. They can come as a cream, jelly, suppository, foam, or tablet. A spermicide should be inserted into  the vagina with an applicator at least 16-07 minutes before sex to allow time for it to work. The process must be repeated every time you have sex. Spermicides do not require a prescription. Intrauterine contraception Intrauterine device (IUD) An IUD is a T-shaped device that is put in a woman's uterus. There are two types:  Hormone IUD.This type contains progestin, a synthetic form of the  hormone progesterone. This type can stay in place for 3-5 years.  Copper IUD.This type is wrapped in copper wire. It can stay in place for 10 years.  Permanent methods of contraception Female tubal ligation In this method, a woman's fallopian tubes are sealed, tied, or blocked during surgery to prevent eggs from traveling to the uterus. Hysteroscopic sterilization In this method, a small, flexible insert is placed into each fallopian tube. The inserts cause scar tissue to form in the fallopian tubes and block them, so sperm cannot reach an egg. The procedure takes about 3 months to be effective. Another form of birth control must be used during those 3 months. Female sterilization This is a procedure to tie off the tubes that carry sperm (vasectomy). After the procedure, the man can still ejaculate fluid (semen). Natural planning methods Natural family planning In this method, a couple does not have sex on days when the woman could become pregnant. Calendar method This means keeping track of the length of each menstrual cycle, identifying the days when pregnancy can happen, and not having sex on those days. Ovulation method In this method, a couple avoids sex during ovulation. Symptothermal method This method involves not having sex during ovulation. The woman typically checks for ovulation by watching changes in her temperature and in the consistency of cervical mucus. Post-ovulation method In this method, a couple waits to have sex until after ovulation. Summary  Contraception, also called birth control, means methods or devices that prevent pregnancy.  Hormonal methods of contraception include implants, injections, pills, patches, vaginal rings, and emergency contraceptives.  Barrier methods of contraception can include female condoms, female condoms, diaphragms, cervical caps, sponges, and spermicides.  There are two types of IUDs (intrauterine devices). An IUD can be put in a woman's  uterus to prevent pregnancy for 3-5 years.  Permanent sterilization can be done through a procedure for males, females, or both.  Natural family planning methods involve not having sex on days when the woman could become pregnant. This information is not intended to replace advice given to you by your health care provider. Make sure you discuss any questions you have with your health care provider. Document Revised: 08/01/2017 Document Reviewed: 09/01/2016 Elsevier Patient Education  2020 [employer].   Breastfeeding  Choosing to breastfeed is one of the best decisions you can make for yourself and your baby. A change in hormones during pregnancy causes your breasts to make breast milk in your milk-producing glands. Hormones prevent breast milk from being released before your baby is born. They also prompt milk flow after birth. Once breastfeeding has begun, thoughts of your baby, as well as his or her sucking or crying, can stimulate the release of milk from your milk-producing glands. Benefits of breastfeeding Research shows that breastfeeding offers many health benefits for infants and mothers. It also offers a cost-free and convenient way to feed your baby. For your baby  Your first milk (colostrum) helps your baby's digestive system to function better.  Special cells in your milk (antibodies) help your baby to fight off infections.  Breastfed babies are  less likely to develop asthma, allergies, obesity, or type 2 diabetes. They are also at lower risk for sudden infant death syndrome (SIDS).  Nutrients in breast milk are better able to meet your baby's needs compared to infant formula.  Breast milk improves your baby's brain development. For you  Breastfeeding helps to create a very special bond between you and your baby.  Breastfeeding is convenient. Breast milk costs nothing and is always available at the correct temperature.  Breastfeeding helps to burn calories. It helps you  to lose the weight that you gained during pregnancy.  Breastfeeding makes your uterus return faster to its size before pregnancy. It also slows bleeding (lochia) after you give birth.  Breastfeeding helps to lower your risk of developing type 2 diabetes, osteoporosis, rheumatoid arthritis, cardiovascular disease, and breast, ovarian, uterine, and endometrial cancer later in life. Breastfeeding basics Starting breastfeeding  Find a comfortable place to sit or lie down, with your neck and back well-supported.  Place a pillow or a rolled-up blanket under your baby to bring him or her to the level of your breast (if you are seated). Nursing pillows are specially designed to help support your arms and your baby while you breastfeed.  Make sure that your baby's tummy (abdomen) is facing your abdomen.  Gently massage your breast. With your fingertips, massage from the outer edges of your breast inward toward the nipple. This encourages milk flow. If your milk flows slowly, you may need to continue this action during the feeding.  Support your breast with 4 fingers underneath and your thumb above your nipple (make the letter "C" with your hand). Make sure your fingers are well away from your nipple and your baby's mouth.  Stroke your baby's lips gently with your finger or nipple.  When your baby's mouth is open wide enough, quickly bring your baby to your breast, placing your entire nipple and as much of the areola as possible into your baby's mouth. The areola is the colored area around your nipple. ? More areola should be visible above your baby's upper lip than below the lower lip. ? Your baby's lips should be opened and extended outward (flanged) to ensure an adequate, comfortable latch. ? Your baby's tongue should be between his or her lower gum and your breast.  Make sure that your baby's mouth is correctly positioned around your nipple (latched). Your baby's lips should create a seal on your  breast and be turned out (everted).  It is common for your baby to suck about 2-3 minutes in order to start the flow of breast milk. Latching Teaching your baby how to latch onto your breast properly is very important. An improper latch can cause nipple pain, decreased milk supply, and poor weight gain in your baby. Also, if your baby is not latched onto your nipple properly, he or she may swallow some air during feeding. This can make your baby fussy. Burping your baby when you switch breasts during the feeding can help to get rid of the air. However, teaching your baby to latch on properly is still the best way to prevent fussiness from swallowing air while breastfeeding. Signs that your baby has successfully latched onto your nipple  Silent tugging or silent sucking, without causing you pain. Infant's lips should be extended outward (flanged).  Swallowing heard between every 3-4 sucks once your milk has started to flow (after your let-down milk reflex occurs).  Muscle movement above and in front of his or her  ears while sucking. Signs that your baby has not successfully latched onto your nipple  Sucking sounds or smacking sounds from your baby while breastfeeding.  Nipple pain. If you think your baby has not latched on correctly, slip your finger into the corner of your baby's mouth to break the suction and place it between your baby's gums. Attempt to start breastfeeding again. Signs of successful breastfeeding Signs from your baby  Your baby will gradually decrease the number of sucks or will completely stop sucking.  Your baby will fall asleep.  Your baby's body will relax.  Your baby will retain a small amount of milk in his or her mouth.  Your baby will let go of your breast by himself or herself. Signs from you  Breasts that have increased in firmness, weight, and size 1-3 hours after feeding.  Breasts that are softer immediately after breastfeeding.  Increased milk  volume, as well as a change in milk consistency and color by the fifth day of breastfeeding.  Nipples that are not sore, cracked, or bleeding. Signs that your baby is getting enough milk  Wetting at least 1-2 diapers during the first 24 hours after birth.  Wetting at least 5-6 diapers every 24 hours for the first week after birth. The urine should be clear or pale yellow by the age of 5 days.  Wetting 6-8 diapers every 24 hours as your baby continues to grow and develop.  At least 3 stools in a 24-hour period by the age of 5 days. The stool should be soft and yellow.  At least 3 stools in a 24-hour period by the age of 7 days. The stool should be seedy and yellow.  No loss of weight greater than 10% of birth weight during the first 3 days of life.  Average weight gain of 4-7 oz (113-198 g) per week after the age of 4 days.  Consistent daily weight gain by the age of 5 days, without weight loss after the age of 2 weeks. After a feeding, your baby may spit up a small amount of milk. This is normal. Breastfeeding frequency and duration Frequent feeding will help you make more milk and can prevent sore nipples and extremely full breasts (breast engorgement). Breastfeed when you feel the need to reduce the fullness of your breasts or when your baby shows signs of hunger. This is called "breastfeeding on demand." Signs that your baby is hungry include:  Increased alertness, activity, or restlessness.  Movement of the head from side to side.  Opening of the mouth when the corner of the mouth or cheek is stroked (rooting).  Increased sucking sounds, smacking lips, cooing, sighing, or squeaking.  Hand-to-mouth movements and sucking on fingers or hands.  Fussing or crying. Avoid introducing a pacifier to your baby in the first 4-6 weeks after your baby is born. After this time, you may choose to use a pacifier. Research has shown that pacifier use during the first year of a baby's life  decreases the risk of sudden infant death syndrome (SIDS). Allow your baby to feed on each breast as long as he or she wants. When your baby unlatches or falls asleep while feeding from the first breast, offer the second breast. Because newborns are often sleepy in the first few weeks of life, you may need to awaken your baby to get him or her to feed. Breastfeeding times will vary from baby to baby. However, the following rules can serve as a guide to  help you make sure that your baby is properly fed:  Newborns (babies 11 weeks of age or younger) may breastfeed every 1-3 hours.  Newborns should not go without breastfeeding for longer than 3 hours during the day or 5 hours during the night.  You should breastfeed your baby a minimum of 8 times in a 24-hour period. Breast milk pumping     Pumping and storing breast milk allows you to make sure that your baby is exclusively fed your breast milk, even at times when you are unable to breastfeed. This is especially important if you go back to work while you are still breastfeeding, or if you are not able to be present during feedings. Your lactation consultant can help you find a method of pumping that works best for you and give you guidelines about how long it is safe to store breast milk. Caring for your breasts while you breastfeed Nipples can become dry, cracked, and sore while breastfeeding. The following recommendations can help keep your breasts moisturized and healthy:  Avoid using soap on your nipples.  Wear a supportive bra designed especially for nursing. Avoid wearing underwire-style bras or extremely tight bras (sports bras).  Air-dry your nipples for 3-4 minutes after each feeding.  Use only cotton bra pads to absorb leaked breast milk. Leaking of breast milk between feedings is normal.  Use lanolin on your nipples after breastfeeding. Lanolin helps to maintain your skin's normal moisture barrier. Pure lanolin is not harmful (not  toxic) to your baby. You may also hand express a few drops of breast milk and gently massage that milk into your nipples and allow the milk to air-dry. In the first few weeks after giving birth, some women experience breast engorgement. Engorgement can make your breasts feel heavy, warm, and tender to the touch. Engorgement peaks within 3-5 days after you give birth. The following recommendations can help to ease engorgement:  Completely empty your breasts while breastfeeding or pumping. You may want to start by applying warm, moist heat (in the shower or with warm, water-soaked hand towels) just before feeding or pumping. This increases circulation and helps the milk flow. If your baby does not completely empty your breasts while breastfeeding, pump any extra milk after he or she is finished.  Apply ice packs to your breasts immediately after breastfeeding or pumping, unless this is too uncomfortable for you. To do this: ? Put ice in a plastic bag. ? Place a towel between your skin and the bag. ? Leave the ice on for 20 minutes, 2-3 times a day.  Make sure that your baby is latched on and positioned properly while breastfeeding. If engorgement persists after 48 hours of following these recommendations, contact your health care provider or a Science writer. Overall health care recommendations while breastfeeding  Eat 3 healthy meals and 3 snacks every day. Well-nourished mothers who are breastfeeding need an additional 450-500 calories a day. You can meet this requirement by increasing the amount of a balanced diet that you eat.  Drink enough water to keep your urine pale yellow or clear.  Rest often, relax, and continue to take your prenatal vitamins to prevent fatigue, stress, and low vitamin and mineral levels in your body (nutrient deficiencies).  Do not use any products that contain nicotine or tobacco, such as cigarettes and e-cigarettes. Your baby may be harmed by chemicals from  cigarettes that pass into breast milk and exposure to secondhand smoke. If you need help quitting, ask your  health care provider.  Avoid alcohol.  Do not use illegal drugs or marijuana.  Talk with your health care provider before taking any medicines. These include over-the-counter and prescription medicines as well as vitamins and herbal supplements. Some medicines that may be harmful to your baby can pass through breast milk.  It is possible to become pregnant while breastfeeding. If birth control is desired, ask your health care provider about options that will be safe while breastfeeding your baby. Where to find more information: [employer]: www.llli.org Contact a health care provider if:  You feel like you want to stop breastfeeding or have become frustrated with breastfeeding.  Your nipples are cracked or bleeding.  Your breasts are red, tender, or warm.  You have: ? Painful breasts or nipples. ? A swollen area on either breast. ? A fever or chills. ? Nausea or vomiting. ? Drainage other than breast milk from your nipples.  Your breasts do not become full before feedings by the fifth day after you give birth.  You feel sad and depressed.  Your baby is: ? Too sleepy to eat well. ? Having trouble sleeping. ? More than 13 week old and wetting fewer than 6 diapers in a 24-hour period. ? Not gaining weight by 2 days of age.  Your baby has fewer than 3 stools in a 24-hour period.  Your baby's skin or the white parts of his or her eyes become yellow. Get help right away if:  Your baby is overly tired (lethargic) and does not want to wake up and feed.  Your baby develops an unexplained fever. Summary  Breastfeeding offers many health benefits for infant and mothers.  Try to breastfeed your infant when he or she shows early signs of hunger.  Gently tickle or stroke your baby's lips with your finger or nipple to allow the baby to open his or her mouth.  Bring the baby to your breast. Make sure that much of the areola is in your baby's mouth. Offer one side and burp the baby before you offer the other side.  Talk with your health care provider or lactation consultant if you have questions or you face problems as you breastfeed. This information is not intended to replace advice given to you by your health care provider. Make sure you discuss any questions you have with your health care provider. Document Revised: 10/24/2017 Document Reviewed: 08/31/2016 Elsevier Patient Education  [place].

## 2020-07-13 NOTE — Progress Notes (Signed)
   Subjective:  Janice Stewart is a 34 y.o. G3P1011 at [redacted]w[redacted]d being seen today for ongoing prenatal care.  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  Patient reports no complaints.  Contractions: Irregular. Vag. Bleeding: None.  Movement: Present. Denies leaking of fluid.   The following portions of the patient's history were reviewed and updated as appropriate: allergies, current medications, past family history, past medical history, past social history, past surgical history and problem list. Problem list updated.  Objective:   Vitals:   07/13/20 0827  BP: 120/72  Pulse: 91  Weight: 231 lb (104.8 kg)    Fetal Status: Fetal Heart Rate (bpm): NST   Movement: Present     General:  Alert, oriented and cooperative. Patient is in no acute distress.  Skin: Skin is warm and dry. No rash noted.   Cardiovascular: Normal heart rate noted  Respiratory: Normal respiratory effort, no problems with respiration noted  Abdomen: Soft, gravid, appropriate for gestational age. Pain/Pressure: Present     Pelvic: Vag. Bleeding: None     Cervical exam deferred        Extremities: Normal range of motion.  Edema: Trace  Mental Status: Normal mood and affect. Normal behavior. Normal judgment and thought content.   Urinalysis:      Assessment and Plan:  Pregnancy: G3P1011 at [redacted]w[redacted]d  1. Supervision of high risk pregnancy, antepartum BP normal, NST reactive  2. Post term pregnancy, antepartum condition or complication Scheduled for IOL on 12/5, orders placed  3. 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 History of CVA (cerebrovascular accident) In setting of choriocarcinoma, received TPA, no  residual symptoms  5. Sickle cell trait ([place])   Term labor symptoms and general obstetric precautions including but not limited to vaginal bleeding, contractions, leaking of fluid and fetal movement were reviewed in detail with the patient. Please refer to After Visit Summary for other counseling recommendations.  Return in 6 weeks (on 08/24/2020) for PP check.   Clarnce Flock, MD

## 2020-07-13 NOTE — Progress Notes (Signed)

## 2020-07-13 NOTE — Progress Notes (Signed)
IOL scheduled on 12/5 in AM

## 2020-07-15 ENCOUNTER — Other Ambulatory Visit (HOSPITAL_COMMUNITY)
Admission: RE | Admit: 2020-07-15 | Discharge: 2020-07-15 | Disposition: A | Discharge status: Home / Self Care | Payer: Medicaid Other | Source: Ambulatory Visit | Attending: Family Medicine | Admitting: Family Medicine

## 2020-07-15 DIAGNOSIS — Z20822 Contact with and (suspected) exposure to covid-19: Secondary | ICD-10-CM | POA: Insufficient documentation

## 2020-07-15 DIAGNOSIS — Z01812 Encounter for preprocedural laboratory examination: Secondary | ICD-10-CM | POA: Diagnosis present

## 2020-07-15 LAB — SARS CORONAVIRUS 2 (TAT 6-24 HRS): SARS Coronavirus 2: NEGATIVE

## 2020-07-17 ENCOUNTER — Inpatient Hospital Stay (HOSPITAL_COMMUNITY): Payer: Medicaid Other | Admitting: Anesthesiology

## 2020-07-17 ENCOUNTER — Inpatient Hospital Stay (HOSPITAL_COMMUNITY)
Admission: AD | Admit: 2020-07-17 | Discharge: 2020-07-20 | DRG: 805 | Disposition: A | Discharge status: Home / Self Care | Payer: Medicaid Other | Attending: Obstetrics and Gynecology | Admitting: Obstetrics and Gynecology

## 2020-07-17 ENCOUNTER — Inpatient Hospital Stay (HOSPITAL_COMMUNITY): Payer: Medicaid Other

## 2020-07-17 ENCOUNTER — Encounter (HOSPITAL_COMMUNITY): Payer: Self-pay | Admitting: Family Medicine

## 2020-07-17 ENCOUNTER — Other Ambulatory Visit: Payer: Self-pay

## 2020-07-17 DIAGNOSIS — O9903 Anemia complicating the puerperium: Secondary | ICD-10-CM | POA: Diagnosis not present

## 2020-07-17 DIAGNOSIS — O99214 Obesity complicating childbirth: Secondary | ICD-10-CM | POA: Diagnosis present

## 2020-07-17 DIAGNOSIS — C58 Malignant neoplasm of placenta: Secondary | ICD-10-CM | POA: Diagnosis present

## 2020-07-17 DIAGNOSIS — Z8544 Personal history of malignant neoplasm of other female genital organs: Secondary | ICD-10-CM

## 2020-07-17 DIAGNOSIS — D573 Sickle-cell trait: Secondary | ICD-10-CM | POA: Diagnosis present

## 2020-07-17 DIAGNOSIS — Z8673 Personal history of transient ischemic attack (TIA), and cerebral infarction without residual deficits: Secondary | ICD-10-CM | POA: Diagnosis not present

## 2020-07-17 DIAGNOSIS — E669 Obesity, unspecified: Secondary | ICD-10-CM | POA: Diagnosis present

## 2020-07-17 DIAGNOSIS — Z3A41 41 weeks gestation of pregnancy: Secondary | ICD-10-CM

## 2020-07-17 DIAGNOSIS — R339 Retention of urine, unspecified: Secondary | ICD-10-CM | POA: Diagnosis not present

## 2020-07-17 DIAGNOSIS — O99893 Other specified diseases and conditions complicating puerperium: Secondary | ICD-10-CM | POA: Diagnosis not present

## 2020-07-17 DIAGNOSIS — O411290 Chorioamnionitis, unspecified trimester, not applicable or unspecified: Secondary | ICD-10-CM

## 2020-07-17 DIAGNOSIS — O411230 Chorioamnionitis, third trimester, not applicable or unspecified: Secondary | ICD-10-CM | POA: Diagnosis present

## 2020-07-17 DIAGNOSIS — O9902 Anemia complicating childbirth: Secondary | ICD-10-CM | POA: Diagnosis present

## 2020-07-17 DIAGNOSIS — O480 Post-term pregnancy: Principal | ICD-10-CM | POA: Diagnosis present

## 2020-07-17 DIAGNOSIS — R338 Other retention of urine: Secondary | ICD-10-CM | POA: Diagnosis not present

## 2020-07-17 DIAGNOSIS — Z8759 Personal history of other complications of pregnancy, childbirth and the puerperium: Secondary | ICD-10-CM | POA: Diagnosis not present

## 2020-07-17 DIAGNOSIS — O0990 Supervision of high risk pregnancy, unspecified, unspecified trimester: Secondary | ICD-10-CM

## 2020-07-17 LAB — CBC
HCT: 33.2 % — ABNORMAL LOW (ref 36.0–46.0)
Hemoglobin: 11.1 g/dL — ABNORMAL LOW (ref 12.0–15.0)
MCH: 28.8 pg (ref 26.0–34.0)
MCHC: 33.4 g/dL (ref 30.0–36.0)
MCV: 86 fL (ref 80.0–100.0)
Platelets: 233 10*3/uL (ref 150–400)
RBC: 3.86 MIL/uL — ABNORMAL LOW (ref 3.87–5.11)
RDW: 14.1 % (ref 11.5–15.5)
WBC: 10.2 10*3/uL (ref 4.0–10.5)
nRBC: 0 % (ref 0.0–0.2)

## 2020-07-17 MED ORDER — LACTATED RINGERS IV SOLN
500.0000 mL | INTRAVENOUS | Status: DC | PRN
  Administered 2020-07-17: 1000 mL via INTRAVENOUS

## 2020-07-17 MED ORDER — EPHEDRINE 5 MG/ML INJ: 10.0000 mg | INTRAVENOUS | Status: DC | PRN

## 2020-07-17 MED ORDER — LIDOCAINE HCL (PF) 1 % IJ SOLN
INTRAMUSCULAR | Status: DC | PRN
  Administered 2020-07-17: 2 mL via EPIDURAL
  Administered 2020-07-17: 10 mL via EPIDURAL

## 2020-07-17 MED ORDER — PHENYLEPHRINE 40 MCG/ML (10ML) SYRINGE FOR IV PUSH (FOR BLOOD PRESSURE SUPPORT): 80.0000 ug | PREFILLED_SYRINGE | INTRAVENOUS | Status: DC | PRN

## 2020-07-17 MED ORDER — TERBUTALINE SULFATE 1 MG/ML IJ SOLN: 0.2500 mg | Freq: Once | INTRAMUSCULAR | Status: DC | PRN

## 2020-07-17 MED ORDER — LACTATED RINGERS IV SOLN: 500.0000 mL | Freq: Once | INTRAVENOUS | Status: DC

## 2020-07-17 MED ORDER — ACETAMINOPHEN 325 MG PO TABS
650.0000 mg | ORAL_TABLET | ORAL | Status: DC | PRN
  Administered 2020-07-18 (×2): 650 mg via ORAL
  Filled 2020-07-17 (×2): qty 2

## 2020-07-17 MED ORDER — OXYTOCIN BOLUS FROM INFUSION
333.0000 mL | Freq: Once | INTRAVENOUS | Status: AC
  Administered 2020-07-18: 333 mL via INTRAVENOUS

## 2020-07-17 MED ORDER — SODIUM CHLORIDE (PF) 0.9 % IJ SOLN
INTRAMUSCULAR | Status: DC | PRN
  Administered 2020-07-17: 12 mL/h via EPIDURAL

## 2020-07-17 MED ORDER — LIDOCAINE HCL (PF) 1 % IJ SOLN: 30.0000 mL | INTRAMUSCULAR | Status: DC | PRN

## 2020-07-17 MED ORDER — LACTATED RINGERS IV SOLN: INTRAVENOUS | Status: DC

## 2020-07-17 MED ORDER — SOD CITRATE-CITRIC ACID 500-334 MG/5ML PO SOLN: 30.0000 mL | ORAL | Status: DC | PRN

## 2020-07-17 MED ORDER — FENTANYL CITRATE (PF) 100 MCG/2ML IJ SOLN
50.0000 ug | INTRAMUSCULAR | Status: DC | PRN
  Administered 2020-07-18 – 2020-07-19 (×2): 100 ug via INTRAVENOUS
  Filled 2020-07-17 (×2): qty 2

## 2020-07-17 MED ORDER — OXYTOCIN-SODIUM CHLORIDE 30-0.9 UT/500ML-% IV SOLN
2.5000 [IU]/h | INTRAVENOUS | Status: DC
  Filled 2020-07-17: qty 500

## 2020-07-17 MED ORDER — DIPHENHYDRAMINE HCL 50 MG/ML IJ SOLN: 12.5000 mg | INTRAMUSCULAR | Status: DC | PRN

## 2020-07-17 MED ORDER — MISOPROSTOL 50MCG HALF TABLET
50.0000 ug | ORAL_TABLET | ORAL | Status: DC | PRN
  Administered 2020-07-17: 50 ug via ORAL
  Filled 2020-07-17: qty 1

## 2020-07-17 MED ORDER — FENTANYL-BUPIVACAINE-NACL 0.5-0.125-0.9 MG/250ML-% EP SOLN
12.0000 mL/h | EPIDURAL | Status: DC | PRN
  Administered 2020-07-18: 12 mL/h via EPIDURAL
  Filled 2020-07-17 (×2): qty 250

## 2020-07-17 MED ORDER — ONDANSETRON HCL 4 MG/2ML IJ SOLN
4.0000 mg | Freq: Four times a day (QID) | INTRAMUSCULAR | Status: DC | PRN
  Administered 2020-07-18: 4 mg via INTRAVENOUS
  Filled 2020-07-17: qty 2

## 2020-07-17 MED ORDER — OXYTOCIN-SODIUM CHLORIDE 30-0.9 UT/500ML-% IV SOLN
1.0000 m[IU]/min | INTRAVENOUS | Status: DC
  Administered 2020-07-17: 2 m[IU]/min via INTRAVENOUS
  Filled 2020-07-17: qty 500

## 2020-07-17 NOTE — Progress Notes (Signed)
Janice Stewart is a 34 y.o. G3P1011 at [redacted]w[redacted]d admitted for induction of labor due to Post dates. Due date 07/10/2020.  Subjective:  Patient just got epidural. Resting comfortably now. Pitocin hasn't been started at this time.   Objective: BP 122/77   Pulse 71   Temp 97.9 F (36.6 C) (Oral)   Resp 19  No intake/output data recorded. No intake/output data recorded.  FHT:  FHR: 125 bpm, variability: moderate,  accelerations:  Present,  decelerations:  Absent UC:   irregular, every 2-6 minutes SVE:   Dilation: 5 Effacement (%): 80 Station: -1 Exam by:: Norman Herrlich CNM  AROM for clear fluid Foley catheter placed   Labs: Lab Results  Component Value Date   WBC 10.2 07/17/2020   HGB 11.1 (L) 07/17/2020   HCT 33.2 (L) 07/17/2020   MCV 86.0 07/17/2020   PLT 233 07/17/2020    Assessment / Plan: IOL due to post dates. Has had one dose of cytotec. Now AROM and will start pitocin   Labor: Progressing normally Preeclampsia:  NA Fetal Wellbeing:  Category I Pain Control:  Epidural I/D:  n/a Anticipated MOD:  NSVD   Marcille Buffy DNP, CNM  07/17/20  6:35 PM

## 2020-07-17 NOTE — H&P (Signed)
Janice Stewart is a 34 y.o. female presenting for IOL 2/2 post-dates. OB History    Gravida  3   Para  1   Term  1   Preterm      AB  1   Living  1     SAB  1   TAB      Ectopic      Multiple      Live Births  1          Past Medical History:  Diagnosis Date  . Cancer ([place])   . Stroke [facility])    Past Surgical History:  Procedure Laterality Date  . NO PAST SURGERIES     Family History: family history includes Cancer in her father; Hypertension in her father; Varicose Veins in her mother. Social History:  reports that she has never smoked. She has never used smokeless tobacco. She reports previous alcohol use. She reports that she does not use drugs.   Patient Active Problem List   Diagnosis Date Noted  . Post-dates pregnancy 07/17/2020  . Sickle cell trait ([place]) 02/22/2020  . Supervision of high risk pregnancy, antepartum 01/25/2020  . 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 History of CVA (cerebrovascular accident) 01/25/2020    Nursing Staff Provider  Office Location [place] Dating  11 wk u/s  Language  English Anatomy US  02/26/20, normal  Flu Vaccine  04/21/20 Genetic Screen  NIPS: LR     TDaP vaccine  04/21/20  Hgb A1C or  GTT Early  Third trimester  Glucose, Fasting 65 - 91 mg/dL 73   Glucose, 1 hour 65 - 179 mg/dL 97   Glucose, 2 hour 65 - 152 mg/dL 82      Rhogam  n/a   LAB RESULTS     Blood Type O/Positive/-- (06/14 1550) O positive  Feeding Plan Breast Antibody Negative (06/14 1550)negative  Contraception Depo Rubella 4.85 (06/14 1550)immune  Circumcision Yes RPR Non Reactive (06/14 1550) negative  Pediatrician  List Given HBsAg Negative (06/14 1550) negative  Support Person Edre(FOB) HCVAb  negative  Prenatal Classes  offered HIV Non Reactive (06/14 1550)  negative   BTL Consent NA GBS  Negative   VBAC Consent NA Pap  normal 01/25/2020    Hgb Electro   [place] Trait  BP Cuff Has own CF negative    SMA negative     Waterbirth  [ ]  Class [ ]  Consent [ ]  CNM visit    Induction  [ ]  Orders Entered [ ] Foley Y/N      Maternal Diabetes: No Genetic Screening: Normal Maternal Ultrasounds/Referrals: Normal Fetal Ultrasounds or other Referrals:  None Maternal Substance Abuse:  No Significant Maternal Medications:  None Significant Maternal Lab Results:  Group B Strep negative Other Comments:  Dx with Choriocarcinoma in 2017 in [place]. No Hx Molar preg per MFM.   Review of Systems  All other systems reviewed and are negative.  Maternal Medical History:  Contractions: Frequency: irregular.   Perceived severity is moderate.    Fetal activity: Perceived fetal activity is normal.   Last perceived fetal movement was within the past hour.    Prenatal complications: no prenatal complications Prenatal Complications - Diabetes: none.    Dilation: 2 Effacement (%): 50 Station: -2 Exam by:: Norman Herrlich CNM Blood pressure 129/81, pulse 71, temperature 98 F (36.7 C), temperature source Oral, resp. rate 16. Maternal Exam:  Uterine Assessment: Contraction strength is moderate.  Contraction frequency is irregular.  Abdomen: Patient reports no abdominal tenderness. Fetal presentation: vertex  Introitus: Normal vulva. Normal vagina.  Pelvis: adequate for delivery.   Cervix: Cervix evaluated by digital exam.     Fetal Exam Fetal Monitor Review: Mode: ultrasound.   Baseline rate: 130.  Variability: moderate (6-25 bpm).   Pattern: accelerations present and no decelerations.    Fetal State Assessment: Category I - tracings are normal.     Physical Exam Vitals and nursing note reviewed. Exam conducted with a chaperone present.  Constitutional:      General: She is not in acute distress. HENT:     Head: Normocephalic.  Cardiovascular:     Rate and Rhythm: Normal rate.  Pulmonary:     Effort: Pulmonary effort is normal.  Abdominal:     Tenderness: There is no abdominal tenderness.  Genitourinary:     General: Normal vulva.  Skin:    General: Skin is warm and dry.  Neurological:     Mental Status: She is alert and oriented to person, place, and time.  Psychiatric:        Mood and Affect: Mood normal.        Behavior: Behavior normal.     Prenatal labs: ABO, Rh: O positive  Antibody: Negative  Rubella: 4.85 (06/14 1550) RPR: Non Reactive (09/09 0933)  HBsAg: Negative (06/14 1550)  HIV: Non Reactive (09/09 0933)  GBS: Negative/-- (11/03 1111)   Assessment/Plan: 34 y.o. G3P1011 at [redacted]w[redacted]d  - Admit to labor and delivery for IOL 2/2 post-dates - Cervix still thick, will start with cytotec at this time - Hx of choriocarcinoma in 2017 plan to send placenta to pathology - GBS negative   Marcille Buffy DNP, CNM  07/17/20  1:01 PM

## 2020-07-17 NOTE — Anesthesia Procedure Notes (Signed)
Epidural Patient location during procedure: OB Start time: 07/17/2020 5:52 PM End time: 07/17/2020 6:03 PM  Staffing Anesthesiologist: Pervis Hocking, DO Performed: anesthesiologist   Preanesthetic Checklist Completed: patient identified, IV checked, risks and benefits discussed, monitors and equipment checked, pre-op evaluation and timeout performed  Epidural Patient position: sitting Prep: DuraPrep and site prepped and draped Patient monitoring: continuous pulse ox, blood pressure, heart rate and cardiac monitor Approach: midline Location: L3-L4 Injection technique: LOR air  Needle:  Needle type: Tuohy  Needle gauge: 17 G Needle length: 9 cm Needle insertion depth: 7.5 cm Catheter type: closed end flexible Catheter size: 19 Gauge Catheter at skin depth: 13 cm Test dose: negative  Assessment Sensory level: T8 Events: blood not aspirated, injection not painful, no injection resistance, no paresthesia and negative IV test  Additional Notes Patient identified. Risks/Benefits/Options discussed with patient including but not limited to bleeding, infection, nerve damage, paralysis, failed block, incomplete pain control, headache, blood pressure changes, nausea, vomiting, reactions to medication both or allergic, itching and postpartum back pain. Confirmed with bedside nurse the patient's most recent platelet count. Confirmed with patient that they are not currently taking any anticoagulation, have any bleeding history or any family history of bleeding disorders. Patient expressed understanding and wished to proceed. All questions were answered. Sterile technique was used throughout the entire procedure. Please see nursing notes for vital signs. Test dose was given through epidural catheter and negative prior to continuing to dose epidural or start infusion. Warning signs of high block given to the patient including shortness of breath, tingling/numbness in hands, complete motor  block, or any concerning symptoms with instructions to call for help. Patient was given instructions on fall risk and not to get out of bed. All questions and concerns addressed with instructions to call with any issues or inadequate analgesia.  Reason for block:procedure for pain

## 2020-07-17 NOTE — Progress Notes (Signed)
Labor Progress Note Janice Stewart is a 34 y.o. G3P1011 at [redacted]w[redacted]d presented for post dates IOL.  S: Patient doing well, has no concerns at this time. Denies vision changes and headache. Denies abdominal or vaginal pressure. Husband at bedside.   O:  BP 130/88   Pulse 86   Temp 98.2 F (36.8 C) (Oral)   Resp 16  EFM: 130 bpm/moderate variability/acclelerations without decelerations  Contractions q2-5 min   CVE: Dilation: 5 Effacement (%): 80 Cervical Position: Middle Station: -2 Presentation: Vertex Exam by:: Melissa Hopkins RN   A&P: 34 y.o. [id number] [redacted]w[redacted]d presenting for post dates IOL.  #Labor: Progressing well. Cytotec x1. AROM with clear fluid and pitocin started at 1830.  #Pain: epidural  #FWB: category 1 #GBS negative #hx of choriocarcinoma (2017): placenta to be sent to pathology following delivery  Janice Grussing, DO 10:28 PM

## 2020-07-17 NOTE — Anesthesia Preprocedure Evaluation (Signed)
Anesthesia Evaluation  Patient identified by MRN, date of birth, ID band Patient awake    Reviewed: Allergy & Precautions, Patient's Chart, lab work & pertinent test results  Airway Mallampati: II  TM Distance: >3 FB Neck ROM: Full    Dental no notable dental hx.    Pulmonary neg pulmonary ROS,    Pulmonary exam normal breath sounds clear to auscultation       Cardiovascular negative cardio ROS Normal cardiovascular exam Rhythm:Regular Rate:Normal     Neuro/Psych Takes ASA, last dose 12/4 CVA, No Residual Symptoms negative psych ROS   GI/Hepatic Neg liver ROS, GERD  Medicated and Controlled,  Endo/Other  Obesity BMI 34  Renal/GU negative Renal ROS  negative genitourinary   Musculoskeletal negative musculoskeletal ROS (+)   Abdominal   Peds negative pediatric ROS (+)  Hematology  (+) Blood dyscrasia, Sickle cell trait and anemia , hct 33.2, plt 233   Anesthesia Other Findings   Reproductive/Obstetrics (+) Pregnancy G3P1  Hx gestational choriocarcinoma 2017, no current issues                            Anesthesia Physical Anesthesia Plan  ASA: III and emergent  Anesthesia Plan: Epidural   Post-op Pain Management:    Induction:   PONV Risk Score and Plan: 2  Airway Management Planned: Natural Airway  Additional Equipment: None  Intra-op Plan:   Post-operative Plan:   Informed Consent: I have reviewed the patients History and Physical, chart, labs and discussed the procedure including the risks, benefits and alternatives for the proposed anesthesia with the patient or authorized representative who has indicated his/her understanding and acceptance.       Plan Discussed with:   Anesthesia Plan Comments:         Anesthesia Quick Evaluation

## 2020-07-18 ENCOUNTER — Encounter: Payer: Self-pay | Admitting: *Deleted

## 2020-07-18 DIAGNOSIS — Z3A41 41 weeks gestation of pregnancy: Secondary | ICD-10-CM

## 2020-07-18 DIAGNOSIS — O480 Post-term pregnancy: Secondary | ICD-10-CM

## 2020-07-18 LAB — RPR: RPR Ser Ql: NONREACTIVE

## 2020-07-18 MED ORDER — METHYLERGONOVINE MALEATE 0.2 MG/ML IJ SOLN: 0.2000 mg | Freq: Once | INTRAMUSCULAR | Status: DC

## 2020-07-18 MED ORDER — GENTAMICIN SULFATE 40 MG/ML IJ SOLN
5.0000 mg/kg | Freq: Once | INTRAVENOUS | Status: AC
  Administered 2020-07-18: 410 mg via INTRAVENOUS
  Filled 2020-07-18: qty 10.25

## 2020-07-18 MED ORDER — SODIUM CHLORIDE 0.9 % IV SOLN
2.0000 g | Freq: Four times a day (QID) | INTRAVENOUS | Status: DC
  Administered 2020-07-18: 2 g via INTRAVENOUS
  Filled 2020-07-18: qty 2000

## 2020-07-18 MED ORDER — ACETAMINOPHEN 500 MG PO TABS
1000.0000 mg | ORAL_TABLET | Freq: Four times a day (QID) | ORAL | Status: DC | PRN
  Administered 2020-07-18: 1000 mg via ORAL
  Filled 2020-07-18: qty 2

## 2020-07-18 MED ORDER — METHYLERGONOVINE MALEATE 0.2 MG/ML IJ SOLN
INTRAMUSCULAR | Status: AC
  Administered 2020-07-18: 0.2 mg
  Filled 2020-07-18: qty 1

## 2020-07-18 MED ORDER — MISOPROSTOL 200 MCG PO TABS
ORAL_TABLET | ORAL | Status: AC
  Filled 2020-07-18: qty 5

## 2020-07-18 MED ORDER — MISOPROSTOL 200 MCG PO TABS
1000.0000 ug | ORAL_TABLET | Freq: Once | ORAL | Status: AC
  Administered 2020-07-18: 1000 ug via RECTAL

## 2020-07-18 NOTE — Progress Notes (Addendum)
Labor Progress Note Janice Stewart is a 34 y.o. G3P1011 at [redacted]w[redacted]d presented for post dates IOL.  S: Patient doing well, has no concerns at this time.   O:  BP 103/72   Pulse (!) 148   Temp 98.2 F (36.8 C) (Oral)   Resp 18  EFM: 125 bpm/moderate variability/acclelerations without decelerations  Contractions q3-6 min   CVE: Dilation: 6.5 Effacement (%): 80 Cervical Position: Middle Station: -2 Presentation: Vertex Exam by:: [facility] RN & Dr Larae Grooms    A&P: 34 y.o. [id number] [redacted]w[redacted]d presenting for post dates IOL.  #Labor: Progressing well. Cytotec x1. AROM with clear fluid at 1830. Pitocin started and will titrate as appropriate. #Pain: epidural  #FWB: category 1 #GBS negative #hx of choriocarcinoma (2017): placenta to be sent to pathology following delivery  Pammie Chirino, DO 1:51 AM

## 2020-07-18 NOTE — Progress Notes (Signed)
Labor Progress Note Janice Stewart is a 34 y.o. G3P1011 at [redacted]w[redacted]d presented for post dates IOL.   S: Patient doing well, still denies vaginal pressure. No concerns reported.   O:  BP 122/77   Pulse 78   Temp 99 F (37.2 C) (Axillary)   Resp 15   Ht 5\' 9"  (1.753 m)   Wt 104.8 kg   SpO2 100%   BMI 34.12 kg/m  EFM: 135 bpm/moderate variability/acclelerations without decelerations  Contractions q4-5 min   CVE: Dilation: 6.5 Effacement (%): 80 Cervical Position: Middle Station: -2 Presentation: Vertex Exam by:: Dr Thompson Grayer  A&P: 34 y.o. [id number] [redacted]w[redacted]d presenting for post dates IOL.   #Labor:  IUPC in place. Will continue to titrate pitocin until contractions are adequate.  #Pain: epidural  #FWB: category 1 #GBS negative #hx of choriocarcinoma (2017): placenta to be sent to pathology following delivery  Janice Barter, MD 12:24 PM

## 2020-07-18 NOTE — Progress Notes (Signed)
Labor Progress Note Akemi Delacruz Ander Gaster is a 34 y.o. G3P1011 at [redacted]w[redacted]d presented for post dates IOL.  S: Patient doing well, still denies vaginal pressure. No concerns reported.   O:  BP 123/75   Pulse (!) 107   Temp 99.1 F (37.3 C) (Oral)   Resp 18  EFM: 145 bpm/moderate variability/acclelerations without decelerations  Contractions q2-6 min   CVE: Dilation: 6.5 Effacement (%): 80 Cervical Position: Middle Station: -2 Presentation: Vertex Exam by:: Melissa Hopkins RN   A&P: 34 y.o. G3P1011 [redacted]w[redacted]d presenting for post dates IOL.  #Labor: Cytotec x1. AROM with clear fluid at 1830. Pitocin started at 7125, currently on 22 milli-units/hr. Will take a break from pitocin and restart in 4 hours. At the time of restart, consider IUPC placement. Recheck in 4 hours. #Pain: epidural  #FWB: category 1 #GBS negative #hx of choriocarcinoma (2017): placenta to be sent to pathology following delivery  Charie Pinkus, DO 5:40 AM

## 2020-07-18 NOTE — Progress Notes (Addendum)
Called to L&D room by nurse given concern of several large clots. Fundus firm but slightly deviated to maternal right. Rectal cytotec and methergine administered. Deferred TXA given h/o CVA. Sweep of lower uterine segment expressed several large clots. EBL updated to 550 ml (given additional 483ml of clots). Plan to recheck bleeding in 1 hour prior to transferring pt to postpartum unit.  Randa Ngo, MD OB Fellow, Faculty Practice 07/18/2020 11:54 PM

## 2020-07-18 NOTE — Progress Notes (Signed)
Labor Progress Note Janice Stewart is a 34 y.o. G3P1011 at [redacted]w[redacted]d presented for post dates IOL.   S: Patient feeling more pressure and consistent contractions. Otherwise no complaints.    O:  BP 128/79   Pulse (!) 112   Temp (!) 100.5 F (38.1 C) (Axillary)   Resp 16   Ht 5\' 9"  (1.753 m)   Wt 104.8 kg   SpO2 100%   BMI 34.12 kg/m  EFM: 150 bpm/moderate variability/acclelerations without decelerations  Contractions q4-5 min   CVE: Dilation: 9 Effacement (%): 90 Cervical Position: Middle Station: 0 Presentation: Vertex Exam by:: Dr Jaclynn Guarneri (resident)  A&P: 34 y.o. G3P1011 [redacted]w[redacted]d presenting for post dates IOL.    #Labor:  Patient with Temp of 101.8 Started amp/gent for triple I, will send placenta to pathology. Will continue to titrate pitocin until complete #Pain: epidural  #FWB: category 1 #GBS negative #hx of choriocarcinoma (2017): placenta to be sent to pathology following delivery  Layla Barter, MD 6:20 PM

## 2020-07-18 NOTE — Progress Notes (Signed)
Labor Progress Note Janice Stewart is a 34 y.o. G3P1011 at [redacted]w[redacted]d presented for post dates IOL.   S: Patient doing well, still denies vaginal pressure. No concerns reported.   O:  BP 113/74   Pulse 87   Temp 98.9 F (37.2 C) (Axillary)   Resp 15   Ht 5\' 9"  (1.753 m)   Wt 104.8 kg   BMI 34.12 kg/m  EFM: 130 bpm/moderate variability/acclelerations without decelerations  Contractions q4-5 min   CVE: Dilation: 6.5 Effacement (%): 80 Cervical Position: Middle Station: -2 Presentation: Vertex Exam by:: Dr Thompson Grayer  A&P: 34 y.o. [id number] [redacted]w[redacted]d presenting for post dates IOL.   #Labor: Pitocin holiday from [phone]. IUPC in place. Will restart pitocin and titrate to complete.  #Pain: epidural  #FWB: category 1 #GBS negative #hx of choriocarcinoma (2017): placenta to be sent to pathology following delivery  Layla Barter, MD 10:16 AM

## 2020-07-18 NOTE — Progress Notes (Signed)
Labor Progress Note Janice Stewart is a 34 y.o. G3P1011 at [redacted]w[redacted]d presented for post dates IOL.  S: Patient doing well, has no concerns at this time. Husband at bedside. Denies vaginal pressure.   O:  BP 130/76   Pulse 84   Temp 98.2 F (36.8 C) (Oral)   Resp 18  EFM: 130 bpm/moderate variability/acclelerations without decelerations  Variable contraction pattern  CVE: Dilation: 6.5 Effacement (%): 80 Cervical Position: Middle Station: -2 Presentation: Vertex Exam by:: [facility] RN & Dr Larae Grooms    A&P: 35 y.o. [id number] [redacted]w[redacted]d presenting for post dates IOL.  #Labor: Progressing well. Cytotec x1. AROM with clear fluid at 1830. Patient on pitocin with titration as appropriate.  #Pain: epidural  #FWB: category 1 #GBS negative #hx of choriocarcinoma (2017): placenta to be sent to pathology following delivery  Nohlan Burdin, DO 3:25 AM

## 2020-07-19 ENCOUNTER — Encounter (HOSPITAL_COMMUNITY): Payer: Self-pay | Admitting: Obstetrics and Gynecology

## 2020-07-19 DIAGNOSIS — R338 Other retention of urine: Secondary | ICD-10-CM

## 2020-07-19 DIAGNOSIS — O99893 Other specified diseases and conditions complicating puerperium: Secondary | ICD-10-CM

## 2020-07-19 DIAGNOSIS — O411290 Chorioamnionitis, unspecified trimester, not applicable or unspecified: Secondary | ICD-10-CM

## 2020-07-19 DIAGNOSIS — Z8759 Personal history of other complications of pregnancy, childbirth and the puerperium: Secondary | ICD-10-CM

## 2020-07-19 MED ORDER — DIBUCAINE (PERIANAL) 1 % EX OINT: 1.0000 "application " | TOPICAL_OINTMENT | CUTANEOUS | Status: DC | PRN

## 2020-07-19 MED ORDER — TETANUS-DIPHTH-ACELL PERTUSSIS 5-2.5-18.5 LF-MCG/0.5 IM SUSY: 0.5000 mL | PREFILLED_SYRINGE | Freq: Once | INTRAMUSCULAR | Status: DC

## 2020-07-19 MED ORDER — ACETAMINOPHEN 325 MG PO TABS
650.0000 mg | ORAL_TABLET | Freq: Four times a day (QID) | ORAL | Status: DC
  Administered 2020-07-19 – 2020-07-20 (×7): 650 mg via ORAL
  Filled 2020-07-19 (×7): qty 2

## 2020-07-19 MED ORDER — ONDANSETRON HCL 4 MG/2ML IJ SOLN: 4.0000 mg | INTRAMUSCULAR | Status: DC | PRN

## 2020-07-19 MED ORDER — BENZOCAINE-MENTHOL 20-0.5 % EX AERO
1.0000 "application " | INHALATION_SPRAY | CUTANEOUS | Status: DC | PRN
  Administered 2020-07-20: 1 via TOPICAL
  Filled 2020-07-19: qty 56

## 2020-07-19 MED ORDER — WITCH HAZEL-GLYCERIN EX PADS: 1.0000 "application " | MEDICATED_PAD | CUTANEOUS | Status: DC | PRN

## 2020-07-19 MED ORDER — ONDANSETRON HCL 4 MG PO TABS: 4.0000 mg | ORAL_TABLET | ORAL | Status: DC | PRN

## 2020-07-19 MED ORDER — SENNOSIDES-DOCUSATE SODIUM 8.6-50 MG PO TABS
2.0000 | ORAL_TABLET | ORAL | Status: DC
  Administered 2020-07-19 – 2020-07-20 (×2): 2 via ORAL
  Filled 2020-07-19 (×2): qty 2

## 2020-07-19 MED ORDER — COCONUT OIL OIL
1.0000 "application " | TOPICAL_OIL | Status: DC | PRN
  Administered 2020-07-20: 1 via TOPICAL

## 2020-07-19 MED ORDER — DIPHENHYDRAMINE HCL 25 MG PO CAPS: 25.0000 mg | ORAL_CAPSULE | Freq: Four times a day (QID) | ORAL | Status: DC | PRN

## 2020-07-19 MED ORDER — SIMETHICONE 80 MG PO CHEW: 80.0000 mg | CHEWABLE_TABLET | ORAL | Status: DC | PRN

## 2020-07-19 MED ORDER — IBUPROFEN 600 MG PO TABS
600.0000 mg | ORAL_TABLET | Freq: Four times a day (QID) | ORAL | Status: DC
  Administered 2020-07-19 – 2020-07-20 (×7): 600 mg via ORAL
  Filled 2020-07-19 (×7): qty 1

## 2020-07-19 MED ORDER — OXYCODONE HCL 5 MG PO TABS
5.0000 mg | ORAL_TABLET | Freq: Once | ORAL | Status: AC
  Administered 2020-07-19: 5 mg via ORAL
  Filled 2020-07-19: qty 1

## 2020-07-19 MED ORDER — MEDROXYPROGESTERONE ACETATE 150 MG/ML IM SUSP
150.0000 mg | INTRAMUSCULAR | Status: AC | PRN
  Administered 2020-07-20: 150 mg via INTRAMUSCULAR
  Filled 2020-07-19: qty 1

## 2020-07-19 MED ORDER — PRENATAL MULTIVITAMIN CH
1.0000 | ORAL_TABLET | Freq: Every day | ORAL | Status: DC
  Administered 2020-07-19 – 2020-07-20 (×2): 1 via ORAL
  Filled 2020-07-19 (×2): qty 1

## 2020-07-19 NOTE — Anesthesia Postprocedure Evaluation (Signed)
Anesthesia Post Note  Patient: Janice Stewart trainer  Procedure(s) Performed: AN AD [place]     Patient location during evaluation: Mother Baby Anesthesia Type: Epidural Level of consciousness: awake, awake and alert and oriented Pain management: pain level controlled Vital Signs Assessment: post-procedure vital signs reviewed and stable Respiratory status: spontaneous breathing and respiratory function stable Cardiovascular status: blood pressure returned to baseline Postop Assessment: no headache, epidural receding, patient able to bend at knees, adequate PO intake, no backache, no apparent nausea or vomiting and able to ambulate Anesthetic complications: no   No complications documented.  Last Vitals:  Vitals:   07/19/20 0323 07/19/20 1119  BP: 124/84 138/69  Pulse: 93 77  Resp: 16 17  Temp: 36.8 C 37 C  SpO2: 98% 99%    Last Pain:  Vitals:   07/19/20 1121  TempSrc:   PainSc: 3    Pain Goal: Patients Stated Pain Goal: 0 (07/18/20 1411)                 Bufford Spikes

## 2020-07-19 NOTE — Discharge Summary (Addendum)
Postpartum Discharge Summary    Patient Name: Janice Stewart DOB: 1986-04-03 MRN: [id number]  Date of admission: 07/17/2020 Delivery date:07/18/2020  Delivering provider: Randa Ngo  Date of discharge: 07/20/2020  Admitting diagnosis: Post-dates pregnancy [O48.0] Intrauterine pregnancy: [redacted]w[redacted]d    Secondary diagnosis:  Principal Problem:   Vaginal delivery Active Problems:   Supervision of high risk pregnancy, antepartum   [place]ational choriocarcinoma ([place] in [place]   History of CVA (cerebrovascular accident)   Sickle cell trait ([place]   Post-dates pregnancy   Chorioamnionitis  Additional problems: as noted above  Discharge diagnosis: Vaginal delivery                                           Post partum procedures:n/a Augmentation: AROM, Pitocin and Cytotec Complications: Chorioamnionitis (antibiotics administered prior to delivery)  Hospital course: Induction of Labor With Vaginal Delivery   34 y.o. yo G3P1011 at 493w2das admitted to the hospital 07/17/2020 for induction of labor.  Indication for induction: Postdates.  Patient had an uncomplicated labor course as follows: Membrane Rupture Time/Date: 6:30 PM ,07/17/2020   Delivery Method:Vaginal, Spontaneous  Episiotomy: None  Lacerations:  1st degree  Details of delivery can be found in separate delivery note.  Patient had a routine postpartum course. Given history of choriocarcinoma, placenta was sent to pathology at time of delivery. Patient is discharged home 07/20/20. Pt will need follow-up beta-hcg level in 6 weeks s/p delivery.  Newborn Data: Birth date:07/18/2020  Birth time:10:05 PM  Gender:Female  Living status:Living  Apgars:8 ,9  Weight:4230 g   Magnesium Sulfate received: No BMZ received: No Rhophylac:N/A MMR:N/A T-DaP:Given prenatally Flu: Yes Transfusion:No  Physical exam  Vitals:   07/19/20 1119 07/19/20 1700 07/19/20  2134 07/20/20 0626  BP: 138/69 119/84 116/72 118/74  Pulse: 77 84 63 67  Resp: '17 17 20 18  ' Temp: 98.6 F (37 C) 98.8 F (37.1 C) (!) 97.4 F (36.3 C) 97.6 F (36.4 C)  TempSrc: Oral  Oral Oral  SpO2: 99% 99% 99%   Weight:      Height:       General: alert and no distress Lochia: appropriate Uterine Fundus: firm Incision: N/A DVT Evaluation: No evidence of DVT seen on physical exam. Labs: Lab Results  Component Value Date   WBC 10.2 07/17/2020   HGB 11.1 (L) 07/17/2020   HCT 33.2 (L) 07/17/2020   MCV 86.0 07/17/2020   PLT 233 07/17/2020   CMP Latest Ref Rng & Units 01/25/2020  Glucose 65 - 99 mg/dL 81  BUN 6 - 20 mg/dL 5(L)  Creatinine 0.57 - 1.00 mg/dL 0.73  Sodium 134 - 144 mmol/L 136  Potassium 3.5 - 5.2 mmol/L 4.2  Chloride 96 - 106 mmol/L 103  CO2 20 - 29 mmol/L 18(L)  Calcium 8.7 - 10.2 mg/dL 9.2  Total Protein 6.0 - 8.5 g/dL 6.9  Total Bilirubin 0.0 - 1.2 mg/dL <0.2  Alkaline Phos 48 - 121 IU/L 58  AST 0 - 40 IU/L 15  ALT 0 - 32 IU/L 10   Edinburgh Score: No flowsheet data found.   After visit meds:  Allergies as of 07/20/2020   No Known Allergies     Medication List    STOP taking these medications   aspirin EC 81 MG tablet     TAKE these medications   amLODipine 5 MG tablet Commonly known as:  NORVASC Take 1 tablet (5 mg total) by mouth daily.   famotidine 20 MG tablet Commonly known as: Pepcid Take 1 tablet (20 mg total) by mouth 2 (two) times daily.   ferrous sulfate 325 (65 FE) MG tablet Take 1 tablet (325 mg total) by mouth daily with breakfast.   prenatal vitamin w/FE, FA 27-1 MG Tabs tablet Take 1 tablet by mouth daily at 12 noon.      Discharge home in stable condition Infant Feeding: Breast Infant Disposition:home with mother Discharge instruction: per After Visit Summary and Postpartum booklet. Activity: Advance as tolerated. Pelvic rest for 6 weeks.  Diet: routine diet Future Appointments: Future Appointments  Date  Time Provider [place]  08/23/2020  9:15 AM Clarnce Flock, MD [facility] [place]  08/30/2020  8:20 AM [facility]   Follow up Visit: Message sent to [facility] on 07/19/20.  Please schedule this patient in 1 week for BP check Please schedule this patient for a In person postpartum visit in 4 weeks with the following provider: MD. Additional Postpartum F/U:need for repeat beta-hcg at 6 weeks postpartum given h/o choriocarcinoma (placenta sent to pathology at time of delivery) High risk pregnancy complicated by: h/o CVA and choriocarcinoma (2017), +antibody screen (not clinically significant per lab), chorioamnionitis in labor (amp+gent priort o delivery) Delivery mode:  Vaginal, Spontaneous  Anticipated Birth Control:  Depo  07/20/2020 Layla Barter, MD

## 2020-07-19 NOTE — Lactation Note (Signed)
This note was copied from a baby's chart. Lactation Consultation Note  Patient Name: Janice Stewart [id number] Date: 07/19/2020 Reason for consult: Initial assessment   P1, Baby 21 hours old.  Observed latch in cradle hold.  Assisted w/ hand expression. Feed on demand with cues.  Goal 8-12+ times per day after first 24 hrs.  Place baby STS if not cueing.  Mom made aware of O/P services, breastfeeding support groups, community resources, and our phone # for post-discharge questions.   Maternal Data Has patient been taught Hand Expression?: Yes Does the patient have breastfeeding experience prior to this delivery?: Yes  Feeding Feeding Type: Breast Fed  LATCH Score Latch: Grasps breast easily, tongue down, lips flanged, rhythmical sucking.  Audible Swallowing: A few with stimulation  Type of Nipple: Everted at rest and after stimulation  Comfort (Breast/Nipple): Soft / non-tender  Hold (Positioning): Assistance needed to correctly position infant at breast and maintain latch.  LATCH Score: 8  Interventions Interventions: Breast feeding basics reviewed;Assisted with latch;Skin to skin;Hand express;Support pillows;Hand pump  Lactation Tools Discussed/Used Pump Review: Setup, frequency, and cleaning;Milk Storage   Consult Status Consult Status: Follow-up Date: 07/20/20 Follow-up type: In-patient    Vivianne Master [facility] [place] 07/19/2020, 7:55 AM

## 2020-07-19 NOTE — Anesthesia Postprocedure Evaluation (Signed)
Anesthesia Post Note  Patient: Janice Stewart  Procedure(s) Performed: AN AD [place]     Patient location during evaluation: Mother Baby Anesthesia Type: Epidural Level of consciousness: awake and alert and oriented Pain management: satisfactory to patient Vital Signs Assessment: post-procedure vital signs reviewed and stable Respiratory status: spontaneous breathing and nonlabored ventilation Cardiovascular status: stable Postop Assessment: no headache, no backache, no signs of nausea or vomiting, adequate PO intake, patient able to bend at knees and able to ambulate (patient up walking) Anesthetic complications: no   No complications documented.  Last Vitals:  Vitals:   07/19/20 0208 07/19/20 0323  BP: 129/89 124/84  Pulse: 90 93  Resp: 16 16  Temp: 37.6 C 36.8 C  SpO2: 98% 98%    Last Pain:  Vitals:   07/19/20 0730  TempSrc:   PainSc: 0-No pain   Pain Goal: Patients Stated Pain Goal: 0 (07/18/20 1411)                 Willa Rough

## 2020-07-19 NOTE — Progress Notes (Signed)
   07/18/20 2346 07/18/20 2350 07/19/20 0020  Lochia  Color Rubra Rubra Rubra  Amount Moderate Moderate Small  Odor None None None  Clots Many Few None  Estimated Blood Loss [phone]  --   potential doubling of EBL. Unsure if error in charting?

## 2020-07-19 NOTE — Discharge Instructions (Signed)

## 2020-07-19 NOTE — Progress Notes (Signed)
POSTPARTUM PROGRESS NOTE  Subjective: Janice Stewart is a 34 y.o. [id number] s/p vaginal delivery at [redacted]w[redacted]d.  She reports she doing well. No acute events overnight. She denies any problems with ambulating or po intake. Denies nausea or vomiting. She has passed flatus. Pain is moderately controlled.  Lochia is mild. Foley catheter is still.  Objective: Blood pressure 124/84, pulse 93, temperature 98.2 F (36.8 C), temperature source Oral, resp. rate 16, height 5\' 9"  (1.753 m), weight 104.8 kg, SpO2 98 %, unknown if currently breastfeeding.  Physical Exam:  General: alert, cooperative and no distress Chest: no respiratory distress Abdomen: soft, mildly tender Uterine Fundus: firm and at level of umbilicus Extremities: No calf swelling or tenderness  no LE edema  Recent Labs    07/17/20 1145  HGB 11.1*  HCT 33.2*    Assessment/Plan: Janice Stewart is a 34 y.o. [id number] s/p vaginal delivery at [redacted]w[redacted]d for post-dates IOL.  Routine Postpartum Care: Doing well, pain well-controlled.  -- Continue routine care, lactation support  -- Contraception: depo (ordered with plan to administer prior to discharge per pt request) -- Feeding: breast -- Chorioamnionitis: s/p amp+gent prior to delivery. Last fever 100.61F at 1801 on 12/6. 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 Pt will need beta-hcg level at 6 weeks postpartum. -- Urinary Retention s/p Delivery: foley removed shortly prior to delivery. I&O cath performed s/p delivery with 300 ml out. Given deviated uterus with significant clots expressed s/p lower uterine sweep, foley catheter was replaced (immediately out 1000 ml of urine) with plan to maintain until 1200 on 12/7.  Dispo: Plan for discharge PPD#2.  Randa Ngo, MD OB Fellow, [facility] 07/19/2020 4:48 AM

## 2020-07-20 ENCOUNTER — Other Ambulatory Visit (HOSPITAL_COMMUNITY): Payer: Self-pay | Admitting: Obstetrics and Gynecology

## 2020-07-20 DIAGNOSIS — D573 Sickle-cell trait: Secondary | ICD-10-CM

## 2020-07-20 DIAGNOSIS — Z8759 Personal history of other complications of pregnancy, childbirth and the puerperium: Secondary | ICD-10-CM | POA: Diagnosis not present

## 2020-07-20 DIAGNOSIS — O9903 Anemia complicating the puerperium: Secondary | ICD-10-CM | POA: Diagnosis not present

## 2020-07-20 DIAGNOSIS — Z8673 Personal history of transient ischemic attack (TIA), and cerebral infarction without residual deficits: Secondary | ICD-10-CM

## 2020-07-20 LAB — SURGICAL PATHOLOGY

## 2020-07-20 MED ORDER — AMLODIPINE BESYLATE 5 MG PO TABS
5.0000 mg | ORAL_TABLET | Freq: Every day | ORAL | Status: DC
  Administered 2020-07-20: 5 mg via ORAL
  Filled 2020-07-20: qty 1

## 2020-07-20 MED ORDER — AMLODIPINE BESYLATE 5 MG PO TABS: 5.0000 mg | ORAL_TABLET | Freq: Every day | ORAL | 11 refills | Status: DC

## 2020-07-20 MED FILL — AMLODIPINE BESYLATE 5 MG TA: 5 | 30 days supply | Qty: 30 | Fill #0

## 2020-07-20 NOTE — Lactation Note (Signed)
This note was copied from a baby's chart. Lactation Consultation Note  Patient Name: Janice Stewart [id number] Date: 07/20/2020 Reason for consult: Follow-up assessment  Mom called out for assist with using the hand pump. Mom had been using the anterior lever, but was waiting to see milk before using the posterior lever. I explained that Mom can do the anterior portion for 1.5 minutes, but if she hasn't seen colostrum, by that point, she can start using the posterior lever.  While watching Mom pump, it seemed that the size 24 flange may be too small. I obtained a size 27 flange, but it became apparent that the size 24 was the better size for the time being.   I got coconut oil for Mom & showed her how to lubricate the size 24 flange for pumping.   Matthias Hughs [facility] 07/20/2020, 2:02 PM

## 2020-07-20 NOTE — Lactation Note (Signed)
This note was copied from a baby's chart. Lactation Consultation Note  Patient Name: Boy Thereasa Silas Flood [id number] Date: 07/20/2020 Reason for consult: Follow-up assessment  Follow-up visit to 63 hours old infant, per mother's request. Mother states infant had a circumcision and she would like suggestions to position infant. Undressed infant and set up support pillows for football position to left breast. Demonstrated alignment. Demonstrated hand expression technique, unable to express colostrum. Mother verbalizes discomfort and pain with HE. Noted compression stripe across left nipple. Infant seems sleepy and uninterested to breastfeed. Placed infant skin to skin.   Mother has a hand pump. Reviewed how to use it and care of parts.   Plan:   1-Breastfeeding on demand, ensuring a deep, comfortable latch.  2-Undressing infant and place skin to skin when ready to breastfeed 3-Keep infant awake during breastfeeding session: massaging breast, infant's hand/shoulder/feet 4-Monitor voids and stools as signs good intake.  5-Contact LC as needed for feeds/support/concerns/questions  Maternal Data Formula Feeding for Exclusion: No Has patient been taught Hand Expression?: Yes Does the patient have breastfeeding experience prior to this delivery?: Yes  Feeding Feeding Type: Breast Fed  LATCH Score Latch: Too sleepy or reluctant, no latch achieved, no sucking elicited.  Audible Swallowing: None  Type of Nipple: Everted at rest and after stimulation  Comfort (Breast/Nipple): Filling, red/small blisters or bruises, mild/mod discomfort  Hold (Positioning): Assistance needed to correctly position infant at breast and maintain latch.  LATCH Score: 4  Interventions Interventions: Breast feeding basics reviewed;Assisted with latch;Skin to skin;Breast massage;Hand express;Adjust position;Support pillows;Hand pump  Lactation Tools Discussed/Used Tools: Pump Breast pump type:  Manual   Consult Status Consult Status: Follow-up Date: 07/21/20 Follow-up type: In-patient    Rose Hegner A Higuera Ancidey 07/20/2020, 11:53 AM

## 2020-07-21 LAB — TYPE AND SCREEN
ABO/RH(D): O POS
Antibody Screen: POSITIVE
Unit division: 0
Unit division: 0

## 2020-07-21 LAB — BPAM RBC
Blood Product Expiration Date: 202201082359
Blood Product Expiration Date: 202201092359
Unit Type and Rh: 5100
Unit Type and Rh: 5100

## 2020-07-23 ENCOUNTER — Inpatient Hospital Stay (HOSPITAL_COMMUNITY)
Admission: AD | Admit: 2020-07-23 | Discharge: 2020-07-23 | Disposition: A | Discharge status: Home / Self Care | Payer: Medicaid Other | Attending: Obstetrics and Gynecology | Admitting: Obstetrics and Gynecology

## 2020-07-23 ENCOUNTER — Other Ambulatory Visit: Payer: Self-pay

## 2020-07-23 ENCOUNTER — Encounter (HOSPITAL_COMMUNITY): Payer: Self-pay | Admitting: Obstetrics and Gynecology

## 2020-07-23 DIAGNOSIS — O9089 Other complications of the puerperium, not elsewhere classified: Secondary | ICD-10-CM | POA: Insufficient documentation

## 2020-07-23 DIAGNOSIS — M542 Cervicalgia: Secondary | ICD-10-CM | POA: Insufficient documentation

## 2020-07-23 DIAGNOSIS — R519 Headache, unspecified: Secondary | ICD-10-CM | POA: Insufficient documentation

## 2020-07-23 DIAGNOSIS — M549 Dorsalgia, unspecified: Secondary | ICD-10-CM | POA: Diagnosis present

## 2020-07-23 DIAGNOSIS — N6459 Other signs and symptoms in breast: Secondary | ICD-10-CM | POA: Diagnosis not present

## 2020-07-23 DIAGNOSIS — O99893 Other specified diseases and conditions complicating puerperium: Secondary | ICD-10-CM

## 2020-07-23 DIAGNOSIS — O9279 Other disorders of lactation: Secondary | ICD-10-CM

## 2020-07-23 HISTORY — DX: Essential (primary) hypertension: I10

## 2020-07-23 LAB — URINALYSIS, ROUTINE W REFLEX MICROSCOPIC
Bilirubin Urine: NEGATIVE
Glucose, UA: NEGATIVE mg/dL
Ketones, ur: NEGATIVE mg/dL
Nitrite: NEGATIVE
Protein, ur: NEGATIVE mg/dL
Specific Gravity, Urine: 1.006 (ref 1.005–1.030)
pH: 7 (ref 5.0–8.0)

## 2020-07-23 LAB — COMPREHENSIVE METABOLIC PANEL
ALT: 56 U/L — ABNORMAL HIGH (ref 0–44)
AST: 38 U/L (ref 15–41)
Albumin: 2.9 g/dL — ABNORMAL LOW (ref 3.5–5.0)
Alkaline Phosphatase: 237 U/L — ABNORMAL HIGH (ref 38–126)
Anion gap: 9 (ref 5–15)
BUN: 9 mg/dL (ref 6–20)
CO2: 22 mmol/L (ref 22–32)
Calcium: 9.2 mg/dL (ref 8.9–10.3)
Chloride: 108 mmol/L (ref 98–111)
Creatinine, Ser: 1.02 mg/dL — ABNORMAL HIGH (ref 0.44–1.00)
GFR, Estimated: >60 mL/min (ref >60)
Glucose, Bld: 74 mg/dL (ref 70–99)
Potassium: 5 mmol/L (ref 3.5–5.1)
Sodium: 139 mmol/L (ref 135–145)
Total Bilirubin: 0.2 mg/dL — ABNORMAL LOW (ref 0.3–1.2)
Total Protein: 6.7 g/dL (ref 6.5–8.1)

## 2020-07-23 LAB — CBC WITH DIFFERENTIAL/PLATELET
Abs Immature Granulocytes: 0.58 10*3/uL — ABNORMAL HIGH (ref 0.00–0.07)
Basophils Absolute: 0.1 10*3/uL (ref 0.0–0.1)
Basophils Relative: 1 %
Eosinophils Absolute: 0.2 10*3/uL (ref 0.0–0.5)
Eosinophils Relative: 2 %
HCT: 28.4 % — ABNORMAL LOW (ref 36.0–46.0)
Hemoglobin: 10.2 g/dL — ABNORMAL LOW (ref 12.0–15.0)
Immature Granulocytes: 5 %
Lymphocytes Relative: 18 %
Lymphs Abs: 2.1 10*3/uL (ref 0.7–4.0)
MCH: 30 pg (ref 26.0–34.0)
MCHC: 35.9 g/dL (ref 30.0–36.0)
MCV: 83.5 fL (ref 80.0–100.0)
Monocytes Absolute: 1.2 10*3/uL — ABNORMAL HIGH (ref 0.1–1.0)
Monocytes Relative: 10 %
Neutro Abs: 7.6 10*3/uL (ref 1.7–7.7)
Neutrophils Relative %: 64 %
Platelets: 334 10*3/uL (ref 150–400)
RBC: 3.4 MIL/uL — ABNORMAL LOW (ref 3.87–5.11)
RDW: 14.3 % (ref 11.5–15.5)
WBC: 11.8 10*3/uL — ABNORMAL HIGH (ref 4.0–10.5)
nRBC: 0.2 % (ref 0.0–0.2)

## 2020-07-23 MED ORDER — AMLODIPINE BESYLATE 5 MG PO TABS
5.0000 mg | ORAL_TABLET | Freq: Every day | ORAL | Status: DC
  Administered 2020-07-23: 17:00:00 5 mg via ORAL
  Filled 2020-07-23: qty 1

## 2020-07-23 MED ORDER — COCONUT OIL OIL
1.0000 "application " | TOPICAL_OIL | Status: DC | PRN
  Filled 2020-07-23: qty 120

## 2020-07-23 MED ORDER — IBUPROFEN 800 MG PO TABS
800.0000 mg | ORAL_TABLET | Freq: Once | ORAL | Status: AC
  Administered 2020-07-23: 15:00:00 800 mg via ORAL
  Filled 2020-07-23: qty 1

## 2020-07-23 MED ORDER — OXYCODONE-ACETAMINOPHEN 5-325 MG PO TABS
1.0000 | ORAL_TABLET | Freq: Once | ORAL | Status: AC
  Administered 2020-07-23: 15:00:00 1 via ORAL
  Filled 2020-07-23: qty 1

## 2020-07-23 MED ORDER — IBUPROFEN 600 MG PO TABS: 600.0000 mg | ORAL_TABLET | Freq: Four times a day (QID) | ORAL | 3 refills | Status: DC | PRN

## 2020-07-23 NOTE — MAU Provider Note (Signed)
Chief Complaint:  Back Pain and Headache   Event Date/Time   First Provider Initiated Contact with Patient 07/23/20 1411     HPI: Janice Stewart Dust is a 34 y.o. [phone] on PPD#4 who presents to maternity admissions reporting headache that radiates down her neck and back, breast pain/swelling, increased leg swelling (that has decreased since this morning), and increased vaginal bleeding over the past two days. She was breastfeeding but stopped due to nipple damage/pain, desires to provide milk either by pumping or improved latch. Pt sent home on Norvasc 5mg  daily, which she has been taking except for this morning. Pt had taken 325mg  of Tylenol and 400mg  of ibuprofen at 7am without relief. Denies nausea/vomiting or fever.  Pregnancy Course: Post-dates IOL with vaginal delivery, chorioamnionitis and   Past Medical History:  Diagnosis Date  . Cancer ([place])   . Hypertension   . Stroke ([place])    OB History  Gravida Para Term Preterm AB Living  4 2 2   1 2   SAB IAB Ectopic Multiple Live Births  1     0 2    # Outcome Date GA Lbr Len/2nd Weight Sex Delivery Anes PTL Lv  4 Current           3 Term 07/18/20 [redacted]w[redacted]d 05:25 / 02:29 9 lb 5.2 oz (4.23 kg) M Vag-Spont EPI  LIV     Birth Comments: none  2 Term 21 [redacted]w[redacted]d    Vag-Spont  N   1 SAB            Past Surgical History:  Procedure Laterality Date  . LIVER BIOPSY  2017  . LUNG BIOPSY  2017   Family History  Problem Relation Age of Onset  . Varicose Veins Mother   . Hypertension Father   . Cancer Father        prostate   Social History   Tobacco Use  . Smoking status: Never Smoker  . Smokeless tobacco: Never Used  Vaping Use  . Vaping Use: Never used  Substance Use Topics  . Alcohol use: Not Currently  . Drug use: Never   No Known Allergies No medications prior to admission.   I have reviewed patient's Past Medical Hx, Surgical Hx, Family Hx, Social Hx, medications and allergies.   ROS:  Review of Systems   Constitutional: Negative for fever.  HENT: Negative for congestion and sore throat.   Eyes: Negative for visual disturbance.  Respiratory: Negative for cough and shortness of breath.   Cardiovascular: Positive for leg swelling.  Genitourinary: Positive for vaginal bleeding.  Musculoskeletal: Positive for back pain and neck pain.  Neurological: Positive for headaches.  All other systems reviewed and are negative.  Physical Exam   No data found. Constitutional: Well-developed, well-nourished female in no acute distress.  Cardiovascular: normal rate & rhythm, no murmur Respiratory: normal effort, lung sounds clear throughout GI: Abd soft, non-tender, gravid appropriate for gestational age. Pos BS x 4 MS: Extremities nontender, no edema, normal ROM Neurologic: Alert and oriented x 4.  GU: no CVA tenderness Pelvic exam deferred - bleeding more appropriate upon arrival to MAU Breasts engorged, warm to touch but no redness or streaks noted. Both nipples have scabs from poor latch.  Labs: No results found for this or any previous visit (from the past 24 hour(s)).  Imaging:  No results found.  MAU Course: Orders Placed This Encounter  Procedures  . Urinalysis, Routine w reflex microscopic Urine, Clean Catch  .  CBC with Differential/Platelet  . Comprehensive metabolic panel  . Discharge patient   Meds ordered this encounter  Medications  . oxyCODONE-acetaminophen (PERCOCET/ROXICET) 5-325 MG per tablet 1 tablet  . ibuprofen (ADVIL) tablet 800 mg  . DISCONTD: coconut oil  . DISCONTD: amLODipine (NORVASC) tablet 5 mg  . ibuprofen (ADVIL) 600 MG tablet    Sig: Take 1 tablet (600 mg total) by mouth every 6 (six) hours as needed.    Dispense:  60 tablet    Refill:  3    Order Specific Question:   Supervising Provider    Answer:   Merrily Pew   MDM: Advil + percocet given with good relief of headache and back pain, neck still hurt but relieved with an ice pack and  stretching.  Laid pt flat, applied ice packs to breasts for 46min, then helped her massage/hand express and use DEBP. Explained inflammatory mastitis and how it can affect bleeding/recovery. Reviewed latching techniques to prevent nipple pain and stressed importance of regular emptying of breasts. Reviewed positioning to prevent neck/back pain. Encouraged her to rest, pump/nurse, and eat/hydrate.  Blood pressure elevated just prior to discharge, gave oral Norvasc and instructed pt to restart daily meds in the morning. Pt amenable to plan.  Assessment: 1. Postpartum headache   2. Neck pain   3. Engorgement of breasts, postpartum    Plan: Discharge home in stable condition with return precautions.  Follow up BP check in 3 days + 4wk follow up in office    Allergies as of 07/23/2020   No Known Allergies     Medication List    TAKE these medications   amLODipine 5 MG tablet Commonly known as: NORVASC Take 1 tablet (5 mg total) by mouth daily.   ferrous sulfate 325 (65 FE) MG tablet Take 1 tablet (325 mg total) by mouth daily with breakfast.   ibuprofen 600 MG tablet Commonly known as: ADVIL Take 1 tablet (600 mg total) by mouth every 6 (six) hours as needed.   prenatal vitamin w/FE, FA 27-1 MG Tabs tablet Take 1 tablet by mouth daily at 12 noon.     ASK your doctor about these medications   famotidine 20 MG tablet Commonly known as: Pepcid Take 1 tablet (20 mg total) by mouth 2 (two) times daily.      Gaylan Gerold, CNM, MSN, [facility] 07/25/20 4:44 AM

## 2020-07-23 NOTE — MAU Note (Signed)
Pt reports to mau with c/o headache,constant pain in her entire back and worsening swelling in her feet.  Pt also reports painful swollen breasts after stopping breastfeeding for the past 2 days due to nipple soreness.  Pt reports her bleeding has also increased over the last few days.  Pt states she took 2 tylenols and 1 motrin at 0700 this morning.

## 2020-07-23 NOTE — Discharge Instructions (Signed)
Breast Pumping Tips There may be times when you cannot feed your baby from your breast, such as when you are at work or on a trip. Breast pumping allows you to remove milk from your breast in order to store for later use. There are three ways to pump. You can use:  Your hand to massage and squeeze your breast (hand expression).  A handheld manual pump.  An electric pump. When you first start to pump, you may not get much milk, but after a few days your breasts should start to make more. Pumping can help stimulate your milk supply after your baby is born. It can also help maintain your milk supply when you are away from your baby. When should I pump? You can start pumping soon after your baby is born. Here are some tips on when to pump:  When with your baby: ? Pump after breastfeeding. ? Pump from the free breast while you breastfeed.  When away from your baby: ? Pump every 2-3 hours for about 15 minutes. ? Pump both breasts at the same time if you can.  If your baby gets formula feeding, pump around the time your baby gets that feeding.  If you drank alcohol, wait 2 hours before pumping.  If you are having a procedure with anesthesia, talk to your health care provider about when you should pump before and after. How do I prepare to pump? Take steps to relax. This makes it easier to stimulate your let-down reflex, which is what makes breast milk flow. To help:  Smell one of your infant's blankets or an item of clothing.  Look at a picture or video of your infant.  Sit in a quiet, private space.  Massage your breast and nipple.  Place a warm cloth on your breast. The cloth should be a little wet.  Play relaxing music.  Picture your milk flowing. What are some tips? General tips for pumping breast milk   Always wash your hands before pumping.  If you are not getting very much milk or pumping is uncomfortable, make adjustments to your pump or try using different type of  pumps.  Drink enough fluid to keep your urine clear or pale yellow.  Wear clothing that opens in the front or allows easy access to your breasts.  Pump breast milk directly into clean bottles or other storage containers.  Do not use any products that contain nicotine or tobacco, such as cigarettes and e-cigarettes. These can lower your milk supply and harm your infant. If you need help quitting, ask your health care provider. Tips for storing breast milk   Store breast milk in a clean, BPA-free container, such as glass or plastic bottles or milk storage bags.  Store breast milk in 2-4 ounce batches to reduce waste.  Swirl the breast milk in the container to mix any cream that floats to the top. Do not shake it.  Label all stored milk with the date you pumped it.  The amount of time you can keep breast milk depends on where it is stored: ? Room temperature: 6-8 hours, if the milk is clean. It is best if used within 4 hours. ? Cooler with ice packs: 24 hours. ? Refrigerator: 5-8 days, if the milk is clean. It is best if used within 3 days. ? Freezer: 9-12 months, if the milk is clean and stored away from the freezer door. It is best if used within 6 months.  When using a refrigerator  or freezer, put the milk in the back to keep it as cold as possible.  Thaw frozen milk using warm water. Do not use the microwave. Tips for choosing a breast pump The right pump for you will depend on your comfort and how often you will be away from your baby. When choosing a pump, consider the following:  Manual breast pumps do not need electricity to work. They are usually cheaper than electric pumps, but they can be harder to use. They may be a good choice if you are occasionally away from your baby.  Electric breast pumps are usually more expensive than manual pumps, but they can be easier for some women to use. They can also collect more milk than manual pumps. This makes them a good choice for women  who work in an office or need to be away from their baby for longer periods of time.  The suction cup (flange) should be the right size. If it is the wrong size, it may cause pain and nipple damage.  Before buying a pump, find out whether your insurance covers the cost of a breast pump. Tips for maintaining a breast pump  Check your pump's manual for cleaning tips.  Clean the pump after each use. To do this: ? Wipe down the electrical unit. Use a dry, soft cloth or clean paper towel. Do not put the electrical unit in water or cleaning products. ? Wash the plastic pump parts with soap and warm water or in the dishwasher, if the parts are dishwasher safe. You do not need to clean the tubing unless it comes in contact with breast milk. Let the parts air dry. Avoid drying them with a cloth or towel. ? When the pump parts are clean and dry, put the pump back together. Then store the pump.  If there is water in the tubing when it comes time to pump, attach the tubing to the pump and turn on the pump. Run the pump until the tube is dry.  Avoid touching the inside of pump parts that come in contact with breast milk. Summary  Pumping can help stimulate your milk supply after your baby is born. It can also help maintain your milk supply when you are away from your baby.  When you are away from your infant for several hours, pump for about 15 minutes every 2-3 hours. Pump both breasts at the same time, if you can.  Your health care provider or lactation consultant can help you decide which breast pump is right for you. The right pump for you depends on your comfort, work schedule, and how often you may be away from your baby. This information is not intended to replace advice given to you by your health care provider. Make sure you discuss any questions you have with your health care provider. Document Revised: 11/19/2018 Document Reviewed: 09/03/2016 Elsevier Patient Education  2020 [employer].

## 2020-08-02 ENCOUNTER — Ambulatory Visit: Payer: Self-pay | Attending: Internal Medicine

## 2020-08-02 DIAGNOSIS — Z23 Encounter for immunization: Secondary | ICD-10-CM

## 2020-08-02 NOTE — Progress Notes (Signed)
   Covid-19 Vaccination Clinic  Name:  Janice Stewart    MRN: [id number] DOB: 1985-10-09  08/02/2020  [name] was observed post Covid-19 immunization for 15 minutes without incident. She was provided with Vaccine Information Sheet and instruction to access the V-Safe system.   [name] was instructed to call 911 with any severe reactions post vaccine: Marland Kitchen Difficulty breathing  . Swelling of face and throat  . A fast heartbeat  . A bad rash all over body  . Dizziness and weakness   Immunizations Administered    Name Date Dose VIS Date Route   JANSSEN COVID-19 VACCINE 08/02/2020  5:15 PM 0.5 mL 06/01/2020 Intramuscular   Manufacturer: [employer]   Lot: [id number]   [place]: [phone]

## 2020-08-23 ENCOUNTER — Other Ambulatory Visit: Payer: Self-pay

## 2020-08-23 ENCOUNTER — Encounter: Payer: Self-pay | Admitting: Family Medicine

## 2020-08-23 ENCOUNTER — Ambulatory Visit (INDEPENDENT_AMBULATORY_CARE_PROVIDER_SITE_OTHER): Payer: Self-pay | Admitting: Family Medicine

## 2020-08-23 VITALS — BP 129/82 | HR 67 | Ht 69.0 in | Wt 201.0 lb

## 2020-08-23 DIAGNOSIS — C58 Malignant neoplasm of placenta: Secondary | ICD-10-CM

## 2020-08-23 DIAGNOSIS — Z8759 Personal history of other complications of pregnancy, childbirth and the puerperium: Secondary | ICD-10-CM

## 2020-08-23 DIAGNOSIS — Z8544 Personal history of malignant neoplasm of other female genital organs: Secondary | ICD-10-CM

## 2020-08-23 DIAGNOSIS — O0289 Other abnormal products of conception: Secondary | ICD-10-CM

## 2020-08-23 NOTE — Progress Notes (Signed)
[place] Partum Visit Note  Janice Stewart is a 35 y.o. [phone] female who presents for a postpartum visit. She is 5 weeks postpartum following a normal spontaneous vaginal delivery.  I have fully reviewed the prenatal and intrapartum course. The delivery was at 41.1 gestational weeks.  Anesthesia: epidural. Postpartum course has been uneventful. Baby is doing well. Baby is feeding by formula. Bleeding staining only. Bowel function is normal. Bladder function is normal. Patient is sexually active. Contraception method is Depo-Provera injections. Postpartum depression screening: negative.   The pregnancy intention screening data noted above was reviewed. Potential methods of contraception were discussed. The patient elected to proceed with Hormonal Injection.    Edinburgh Postnatal Depression Scale - 08/23/20 0945      Edinburgh Postnatal Depression Scale:  In the Past 7 Days   I have been able to laugh and see the funny side of things. 0    I have looked forward with enjoyment to things. 0    I have blamed myself unnecessarily when things went wrong. 0    I have been anxious or worried for no good reason. 0    I have felt scared or panicky for no good reason. 0    Things have been getting on top of me. 0    I have been so unhappy that I have had difficulty sleeping. 0    I have felt sad or miserable. 0    I have been so unhappy that I have been crying. 0    The thought of harming myself has occurred to me. 0    Edinburgh Postnatal Depression Scale Total 0            The following portions of the patient's history were reviewed and updated as appropriate: allergies, current medications, past family history, past medical history, past social history, past surgical history and problem list.  Review of Systems Pertinent items noted in HPI and remainder of comprehensive ROS otherwise negative.    Objective:  BP 129/82   Pulse 67   Ht 5\' 9"  (1.753 m)   Wt 201 lb (91.2 kg)    Breastfeeding Unknown   BMI 29.68 kg/m    General:  alert, cooperative and appears stated age   Breasts:  inspection negative, no nipple discharge or bleeding, no masses or nodularity palpable and deferred  Lungs: comfortable on room air  Heart:  deferred  Abdomen: soft, non-tender; bowel sounds normal; no masses,  no organomegaly        Assessment:    Normal postpartum exam. Pap smear not done at today's visit.   Plan:   Essential components of care per ACOG recommendations:  1.  Mood and well being: Patient with negative depression screening today. Reviewed local resources for support.  - Patient does not use tobacco.  - hx of drug use? No    2. Infant care and feeding:  -Patient currently breastmilk feeding? No  -Social determinants of health (SDOH) reviewed in EPIC. No concerns  3. Sexuality, contraception and birth spacing - Patient does not know want a pregnancy in the next year.  Desired family size is possibly 3 children.  - Reviewed forms of contraception in tiered fashion. Patient desired Depo-Provera today.   - Discussed birth spacing of 18 months  4. Sleep and fatigue -Encouraged family/partner/community support of 4 hrs of uninterrupted sleep to help with mood and fatigue  5. Physical Recovery  - Discussed patients delivery and complications -  Patient had a 1st degree laceration, perineal healing reviewed. Patient expressed understanding - Patient has urinary incontinence? No - Patient is safe to resume physical and sexual activity  6.  Health Maintenance - Last pap smear done 01/25/2020 and was normal with negative HPV.  7. Chronic Disease - check beta HCG today for hx of choriocarcinoma, pathology showed chorio and funisitis without evidence of malignancy - discharged on amlodipine though not mentioned in d/c summary. Will stop today, virtual BP visit in 2 weeks, can d/c completely if normal at that time.   Clarnce Flock, MD [facility] [place], [place]

## 2020-08-24 LAB — BETA HCG QUANT (REF LAB): hCG Quant: 1 m[IU]/mL

## 2020-08-29 ENCOUNTER — Encounter: Payer: Self-pay | Admitting: *Deleted

## 2020-08-29 NOTE — Progress Notes (Signed)
Tried to contact patient via phone, left voicemail and sent a My Chart message.  Patient is scheduled for lab appointment tomorrow 08/30/20 at 8:20 am.  Office opening at 10 am due to inclement weather.  Moved patient's appointment to 10 am.  Asked patient to call the office after 10 am tomorrow if she can't keep the appointment so we can reschedule to a different day/time.

## 2020-08-30 ENCOUNTER — Other Ambulatory Visit: Payer: Self-pay

## 2020-08-30 DIAGNOSIS — Z8759 Personal history of other complications of pregnancy, childbirth and the puerperium: Secondary | ICD-10-CM

## 2020-08-30 NOTE — Progress Notes (Unsigned)
bet

## 2020-08-31 LAB — BETA HCG QUANT (REF LAB): hCG Quant: <1 m[IU]/mL

## 2020-09-06 ENCOUNTER — Telehealth (INDEPENDENT_AMBULATORY_CARE_PROVIDER_SITE_OTHER): Payer: Self-pay

## 2020-09-06 VITALS — BP 136/80 | HR 62

## 2020-09-06 DIAGNOSIS — Z0130 Encounter for examination of blood pressure without abnormal findings: Secondary | ICD-10-CM

## 2020-09-06 NOTE — Progress Notes (Signed)
I connected with  [place] on 09/06/20 at 10:00 AM EST by telephone and verified that I am speaking with the correct person using two identifiers.   I discussed the limitations, risks, security and privacy concerns of performing an evaluation and management service by telephone and the availability of in person appointments. I also discussed with the patient that there may be a patient responsible charge related to this service. The patient expressed understanding and agreed to proceed.  Pt has virtual visit scheduled for BP check s/p d'c of Norvasc medication.  Pt denies any headache and visual disturbances.  Pt reports BP 136/80.  Per chart review, provider documented that if BP is WNL pt can continue to not take BP medication.  Pt informed that she can continue to stop taking the medication and to continue to monitor for signs of elevated BP.  Pt verbalized understanding with no further questions.    Verdell Carmine, RN 09/06/2020  10:19 AM

## 2020-09-06 NOTE — Progress Notes (Signed)
Chart reviewed for nurse visit. Agree with plan of care.   Clarnce Flock, MD 09/06/20 3:53 PM

## 2020-09-26 ENCOUNTER — Telehealth: Payer: Self-pay | Admitting: Family Medicine

## 2020-09-26 NOTE — Telephone Encounter (Signed)
-----   Message from Samuel Germany, RN sent at 09/26/2020  4:49 PM EST ----- Regarding: bleeding Patient sent mychart message re: still bleeding / spotting 8 weeks out vaginal delivery - hx choriocarcinoma. Your note from pp bhcg says no further unless AUB- so should we have her come in for bhcg ? Pleae read note If yes, send to clinical pool. I am off next day or two Linda,RN

## 2020-09-26 NOTE — Telephone Encounter (Signed)
See prior messages from patient [email or web address].  Please call her back and ask her to come in for an evaluation and a repeat bHCG. Likely this is normal bleeding but given her history we'll be cautious and evaluate her.

## 2020-09-27 NOTE — Telephone Encounter (Signed)
Called pt and informed her of recommendation by Dr. Dione Plover to have office appointment in order to evaluate her bleeding. She voiced understanding and agreed. She will be notified of appointment information via [place].

## 2020-10-04 ENCOUNTER — Ambulatory Visit: Payer: Self-pay | Admitting: Family Medicine

## 2020-10-05 ENCOUNTER — Encounter: Payer: Self-pay | Admitting: Family Medicine

## 2020-10-05 ENCOUNTER — Ambulatory Visit (INDEPENDENT_AMBULATORY_CARE_PROVIDER_SITE_OTHER): Payer: No Typology Code available for payment source | Admitting: Family Medicine

## 2020-10-05 ENCOUNTER — Other Ambulatory Visit: Payer: Self-pay

## 2020-10-05 DIAGNOSIS — N939 Abnormal uterine and vaginal bleeding, unspecified: Secondary | ICD-10-CM | POA: Diagnosis not present

## 2020-10-05 DIAGNOSIS — C58 Malignant neoplasm of placenta: Secondary | ICD-10-CM

## 2020-10-05 DIAGNOSIS — I10 Essential (primary) hypertension: Secondary | ICD-10-CM

## 2020-10-05 DIAGNOSIS — G44229 Chronic tension-type headache, not intractable: Secondary | ICD-10-CM

## 2020-10-05 DIAGNOSIS — O0289 Other abnormal products of conception: Secondary | ICD-10-CM

## 2020-10-05 LAB — BETA HCG QUANT (REF LAB): hCG Quant: <1 m[IU]/mL

## 2020-10-05 MED ORDER — MEDROXYPROGESTERONE ACETATE 150 MG/ML IM SUSP
150.0000 mg | Freq: Once | INTRAMUSCULAR | Status: AC
  Administered 2020-10-05: 150 mg via INTRAMUSCULAR

## 2020-10-05 MED ORDER — CYCLOBENZAPRINE HCL 10 MG PO TABS: 10.0000 mg | ORAL_TABLET | Freq: Three times a day (TID) | ORAL | 1 refills | Status: DC | PRN

## 2020-10-05 MED ORDER — AMLODIPINE BESYLATE 5 MG PO TABS: 5.0000 mg | ORAL_TABLET | Freq: Every day | ORAL | 11 refills | Status: DC

## 2020-10-05 NOTE — Addendum Note (Signed)
Addended by: Michel Harrow on: 10/05/2020 01:17 PM   Modules accepted: Orders

## 2020-10-05 NOTE — Progress Notes (Signed)
GYNECOLOGY OFFICE VISIT NOTE  History:   Janice Stewart is a 35 y.o. [phone] here today for follow up of abnormal uterine bleeding.  Patient has hx of choriocarcinoma s/p chemo in 2017, also c/b a CVA w no residual deficits Delivery on 07/18/2020 largely unremarkable, did have chorioamnionitis Seen for unremarkable PP visit on 08/23/2020, hcg at that time was negative Has been on Depo since delivery  Today reports she has had ongoing bleeding since delivery Has never completely gone away No clots  Also having almost daily headaches Across the top of her head No vision changes, chest pain, SOB, LE edema Also having low back pain Both improved with ibuprofen and tylenol  BP elevated here Has not been checking at home   Past Medical History:  Diagnosis Date   Cancer ([place])    Hypertension    Stroke [facility])     Past Surgical History:  Procedure Laterality Date   LIVER BIOPSY  2017   LUNG BIOPSY  2017    The following portions of the patient's history were reviewed and updated as appropriate: allergies, current medications, past family history, past medical history, past social history, past surgical history and problem list.   Health Maintenance:  Normal pap and negative HRHPV: 01/25/2020.  Normal mammogram: n/a.   Review of Systems:  Pertinent items noted in HPI and remainder of comprehensive ROS otherwise negative.  Physical Exam:  BP (!) 139/101    Pulse 60    Wt 204 lb 4.8 oz (92.7 kg)    BMI 30.17 kg/m  CONSTITUTIONAL: Well-developed, well-nourished female in no acute distress.  HEENT:  Normocephalic, atraumatic. External right and left ear normal. No scleral icterus.  NECK: Normal range of motion, supple, no masses noted on observation SKIN: No rash noted. Not diaphoretic. No erythema. No pallor. MUSCULOSKELETAL: Normal range of motion. No edema noted. NEUROLOGIC: Alert and oriented to person, place, and time. Normal muscle tone coordination.   PSYCHIATRIC: Normal mood and affect. Normal behavior. Normal judgment and thought content. RESPIRATORY: Effort normal, no problems with respiration noted ABDOMEN: No masses noted. No other overt distention noted.   PELVIC: Normal appearing external genitalia; normal appearing vaginal mucosa and cervix.  Small amount of blood in vault.  Normal uterine size, retroverted, no other palpable masses, no uterine or adnexal tenderness.  Labs and Imaging No results found for this or any previous visit (from the past 168 hour(s)). No results found.    Assessment and Plan:   Problem List Items Addressed This Visit      Other   Gestational choriocarcinoma ([place]) in 2017   Relevant Orders   Beta hCG quant (ref lab)   Ambulatory referral to Gynecologic Oncology   Vaginal delivery - Primary   Relevant Medications   Multiple Vitamins-Minerals (HAIR SKIN AND NAILS FORMULA PO)    Other Visit Diagnoses    Essential hypertension       Relevant Medications   amLODipine (NORVASC) 5 MG tablet   Chronic tension-type headache, not intractable       Relevant Medications   cyclobenzaprine (FLEXERIL) 10 MG tablet   amLODipine (NORVASC) 5 MG tablet      #AUB Likely secondary to depo but considering hx of choriocarcinoma will check beta HCG. Exam reassuring with no enlargement of uterus, no other likely sources of bleeding identified. Would like to switch to alternative method if this continues, but definitely not IUD. Patient also desires to establish care with Gyn Onc, referral placed.  #  Essential HTN Patient had been on norvasc after delivery but stopped due to normal BP check 2 weeks after stopping med. Today BP's significantly elevated, resume amlodipine and urged patient to find a PCP to manage this issue.   #Headaches/Backahces Tension headache and MSK pain, trial flexeril   Return if symptoms worsen or fail to improve.    Total face-to-face time with patient: 25 minutes.  Over 50% of encounter  was spent on counseling and coordination of care.   Clarnce Flock, MD/[facility] for [employer], [place]

## 2020-10-06 ENCOUNTER — Telehealth: Payer: Self-pay | Admitting: *Deleted

## 2020-10-06 NOTE — Telephone Encounter (Signed)
Received message from Ruston @ [place] regarding a referral placed yesterday. She stated that they are in need of pt's records from [place] - including path report and chemo notes. She can be reached @ [phone].

## 2020-10-10 ENCOUNTER — Telehealth: Payer: Self-pay | Admitting: *Deleted

## 2020-10-10 NOTE — Telephone Encounter (Signed)
Called Dr Dione Plover office on 2/24 and left a message requesting records from the 2017 cancer diagnosis from [place].  Ivin Booty from Dr Dione Plover office called back and stated they would reach out to the patient for the records, or have her go into the office to sign a release. Also explained that per our doctors the patient is 5 year out from the diagnosis and can be seen by a regular gyn. Ivin Booty to give Dr Dione Plover the message.

## 2020-10-10 NOTE — Telephone Encounter (Signed)
Called and spoke with Sharyn Lull at Gyn/Onc. She reports they needs records form [place] from 2017. She is not able to schedule an appointment without the records. The GYN/ONC doctors indicated that since patient is 5 years out, she does not need to follow up with GYN/ONC. Will notify Dr. Dione Plover to let him know of recommendation.

## 2020-10-11 ENCOUNTER — Telehealth: Payer: Self-pay

## 2020-10-11 NOTE — Telephone Encounter (Signed)
We can ask the patient if she would like to establish care, if not we can cancel the referral.

## 2020-10-11 NOTE — Telephone Encounter (Signed)
Told Janice Stewart that our office needs to see her records from her diagnosis in 2017.  These records are in [place]. Pt states that she has a copy of her [place] records.  Told her to Call Dr Allen Derry office and tell them she has copies and she can bring them to his office to copy and then they can send to gyn oncology for review. Gave Dr. Allen Derry office number of [phone]. Pt verbalized understanding.

## 2020-10-12 NOTE — Telephone Encounter (Signed)
Called patient, she did not answer. LM for her to call the office at her convenience.   She needs to be informed that GYN/ONC indicates patient does not need to be followed by them and she can have her [facility].   Per chart review, Gyn/Onc is requesting that records be sent to them for review and patient is to bring to the office to make copies to send.

## 2020-10-12 NOTE — Telephone Encounter (Signed)
Called patient and she reports she asked for referral to Gyn/Onc and they are deciding on whether to follow her since it has been 5 years.   Patient would like follow up care in this office. She needs follow up between May 11-25 for Depo injection.   Reviewed with patient that if she can bring her records by the office to be copied to be sent to Gyn/Onc that would be great instead of waiting until her next Depo injection appointment. Patient voiced understanding.

## 2020-11-10 ENCOUNTER — Telehealth: Payer: Self-pay | Admitting: Lactation Services

## 2020-11-10 ENCOUNTER — Telehealth: Payer: Self-pay | Admitting: *Deleted

## 2020-11-10 NOTE — Telephone Encounter (Signed)
Secure chat send to Dr Dione Plover nurse regarding the patient's records from 2017. Patient still has not delivered records to Dr Dione Plover office or signed a medical records release form. Message sent to Dr Dione Plover nurse that "our doctors reviewed the records again without the 2017 records, since it has been more than 5 years from diagnosis, patient can see a regular gyn."

## 2020-11-10 NOTE — Telephone Encounter (Signed)
Called patient and she reports she has not brought records to the office. She reports she can come by next week. Reviewed with patient that we are needing the records where she was being treated for the cancer during pregnancy. Reviewed patient can also come to office to sign ROI so we can contact them to request records. Reviewed the records are needed to determine if she needs to be followed by Oncology with pregnancy. Patient voiced understanding.

## 2020-11-15 ENCOUNTER — Other Ambulatory Visit: Payer: Self-pay

## 2020-11-15 ENCOUNTER — Encounter: Payer: Self-pay | Admitting: Family

## 2020-11-15 ENCOUNTER — Ambulatory Visit (INDEPENDENT_AMBULATORY_CARE_PROVIDER_SITE_OTHER): Payer: No Typology Code available for payment source | Admitting: Family

## 2020-11-15 VITALS — BP 135/85 | HR 88 | Ht 69.8 in | Wt 205.4 lb

## 2020-11-15 DIAGNOSIS — M545 Low back pain, unspecified: Secondary | ICD-10-CM | POA: Diagnosis not present

## 2020-11-15 DIAGNOSIS — G8929 Other chronic pain: Secondary | ICD-10-CM

## 2020-11-15 DIAGNOSIS — Z7689 Persons encountering health services in other specified circumstances: Secondary | ICD-10-CM

## 2020-11-15 DIAGNOSIS — Z8673 Personal history of transient ischemic attack (TIA), and cerebral infarction without residual deficits: Secondary | ICD-10-CM | POA: Diagnosis not present

## 2020-11-15 DIAGNOSIS — I10 Essential (primary) hypertension: Secondary | ICD-10-CM

## 2020-11-15 MED ORDER — AMLODIPINE BESYLATE 10 MG PO TABS: 10.0000 mg | ORAL_TABLET | Freq: Every day | ORAL | 0 refills | Status: DC

## 2020-11-15 NOTE — Progress Notes (Signed)
Establish care BP issues since delivery of child in Dec,  Pain in lower left side of back  Pain above anus

## 2020-11-15 NOTE — Progress Notes (Signed)
Subjective:    Janice Stewart - 35 y.o. female MRN [id number]  Date of birth: 01-14-1986  HPI  [place] is to establish care. Patient has a PMH significant for history of cerebrovascular accident, and sickle cell trait.    Current issues and/or concerns: 1. HYPERTENSION FOLLOW-UP: [facility] 10/05/[facility] per MD note: Patient had been on norvasc after delivery but stopped due to normal BP check 2 weeks after stopping med. Today BP's significantly elevated, resume amlodipine and urged patient to find a PCP to manage this issue.   11/15/2020: Currently taking: see medication list Med Adherence: [x]  Yes    []  No Medication side effects: []  Yes    [x]  No Adherence with salt restriction (low-salt diet): []  Yes    [x]  No Exercise: Yes []  No [x]  Home Monitoring?: [x]  Yes    []  No  Monitoring Frequency: [x]  Yes    []  No Home BP results range: [x]  Yes, 130's-140's/90's Smoking []  Yes [x]  No SOB? []  Yes    [x]  No Chest Pain?: []  Yes    [x]  No Leg swelling?: []  Yes    [x]  No Headaches?: [x]  Yes, Advil, Tylenol, Motrin, Excedrin  Dizziness? []  Yes    [x]  No Comments: Recent delivery December 2021. She is not breastfeeding. History of stroke and choriocarcinoma/spreading to lungs in 2013. She did not follow-up with Neurology after stroke reporting she did not want to at that time.   2. BACK PAIN: Location: lower back mostly left side Onset: after delivery in December 2021 Radiation: near anus, denies constipation, feels like a punching pain, denies blood in the stool/tarry stools Worse with: sitting or sleeping Better with: standing Trauma: no Popping/clicking sounds with movement/bending: no Muscle spasms: no, was prescribed Flexeril and not taking because it does not help   Comments:  ROS per HPI   Health Maintenance:  Health Maintenance Due  Topic Date Due  . COVID-19 Vaccine (2 - Booster for  Janssen series) 09/27/2020     Past Medical History: Patient Active Problem List   Diagnosis Date Noted  . Vaginal delivery 07/19/2020  . 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 Post-dates pregnancy 07/17/2020  . Sickle cell trait ([place]) 02/22/2020  . Supervision of high risk pregnancy, antepartum 01/25/2020  . Gestational choriocarcinoma ([place]) in 2017 01/25/2020  . History of CVA (cerebrovascular accident) 01/25/2020    Social History   reports that she has never smoked. She has never used smokeless tobacco. She reports previous alcohol use. She reports that she does not use drugs.   Family History  family history includes Cancer in her father; Hypertension in her father; Varicose Veins in her mother.   Medications: reviewed and updated   Objective:   Physical Exam BP 135/85 (BP Location: Left Arm, Patient Position: Sitting)   Pulse 88   Ht 5' 9.8" (1.773 m)   Wt 205 lb 6.4 oz (93.2 kg)   SpO2 97%   Breastfeeding No   BMI 29.64 kg/m  Physical Exam Eyes:     Extraocular Movements: Extraocular movements intact.     Conjunctiva/sclera: Conjunctivae normal.     Pupils: Pupils are equal, round, and reactive to light.  Cardiovascular:     Rate and Rhythm: Normal rate and regular rhythm.     Pulses: Normal pulses.     Heart sounds: Normal heart sounds.  Pulmonary:     Effort: Pulmonary effort is normal.  Breath sounds: Normal breath sounds.  Musculoskeletal:     Cervical back: Normal range of motion and neck supple.  Neurological:     General: No focal deficit present.     Mental Status: She is alert and oriented to person, place, and time.  Psychiatric:        Mood and Affect: Mood normal.        Behavior: Behavior normal.      Assessment & Plan:  1. Encounter to establish care: - Patient presents today to establish care.  - Return for annual physical examination, labs, and health maintenance. Arrive fasting meaning having had no food and/or nothing to drink for at  least 8 hours prior to appointment.  Please take scheduled medications as normal.  2. Essential hypertension: - Home blood pressure readings are higher than goal, goal < 130/80. - Increase Amlodipine from 5 mg daily to 10 mg daily.  - Counseled on blood pressure goal of less than 130/80, low-sodium, DASH diet, medication compliance, 150 minutes of moderate intensity exercise per week as tolerated. Discussed medication compliance, adverse effects. - Follow-up with primary provider in 4 weeks or sooner if needed.  - amLODipine (NORVASC) 10 MG tablet; Take 1 tablet (10 mg total) by mouth daily.  Dispense: 30 tablet; Refill: 0  3. Chronic left-sided low back pain, unspecified whether sciatica present: - Referral to Orthopedic Surgery for further evaluation and management. - Ambulatory referral to Orthopedic Surgery  4. History of CVA (cerebrovascular accident): - Stroke occurred 2013. Did not follow-up with Neurology per patient preference.   Patient was given clear instructions to go to Emergency Department or return to medical center if symptoms don't improve, worsen, or new problems develop.The patient verbalized understanding.  I discussed the assessment and treatment plan with the patient. The patient was provided an opportunity to ask questions and all were answered. The patient agreed with the plan and demonstrated an understanding of the instructions.   The patient was advised to call back or seek an in-person evaluation if the symptoms worsen or if the condition fails to improve as anticipated.    Durene Fruits, NP 11/15/2020, 10:36 AM Primary Care at [facility]

## 2020-11-15 NOTE — Patient Instructions (Signed)

## 2020-11-23 ENCOUNTER — Ambulatory Visit: Payer: Self-pay

## 2020-11-23 ENCOUNTER — Ambulatory Visit (INDEPENDENT_AMBULATORY_CARE_PROVIDER_SITE_OTHER): Payer: No Typology Code available for payment source | Admitting: Family Medicine

## 2020-11-23 ENCOUNTER — Other Ambulatory Visit: Payer: Self-pay

## 2020-11-23 DIAGNOSIS — G8929 Other chronic pain: Secondary | ICD-10-CM | POA: Diagnosis not present

## 2020-11-23 DIAGNOSIS — M545 Low back pain, unspecified: Secondary | ICD-10-CM

## 2020-11-23 DIAGNOSIS — M533 Sacrococcygeal disorders, not elsewhere classified: Secondary | ICD-10-CM | POA: Diagnosis not present

## 2020-11-23 MED ORDER — BACLOFEN 10 MG PO TABS
5.0000 mg | ORAL_TABLET | Freq: Three times a day (TID) | ORAL | 3 refills | Status: DC | PRN
Start: 2020-11-23 — End: 2021-01-16

## 2020-11-23 MED ORDER — MELOXICAM 15 MG PO TABS
7.5000 mg | ORAL_TABLET | Freq: Every day | ORAL | 6 refills | Status: DC | PRN
Start: 2020-11-23 — End: 2021-04-19

## 2020-11-23 NOTE — Progress Notes (Signed)
Office Visit Note   Patient: Pilar Westergaard           Date of Birth: 1986/04/03           MRN: [id number] Visit Date: 11/23/2020 Requested by: Camillia Herter, NP [address] [place],  [place] [address] PCP: Camillia Herter, NP  Subjective: Chief Complaint  Patient presents with  . Lower Back - Pain    Pain in the left lower back and in the tailbone area. Started after childbirth 4 months ago (vaginal birth). Initially, she thought the pain just above the anal area was due to childbirth, but it has not gotten any better. Pain is "most of the time." No pain radiating down the leg.     HPI: She is here with pelvis area pain.  4 months ago she delivered her second baby.  Vaginal delivery with an epidural.  During the process she developed significant pain in her left posterior hip and in the tailbone area.  It has not quit hurting since then.  It does not hurt to use the bathroom, but it hurts when she sits or when she twists.  She never had problems like this during her first pregnancy.  She has otherwise been in good health.               ROS:   All other systems were reviewed and are negative.  Objective: Vital Signs: There were no vitals taken for this visit.  Physical Exam:  General:  Alert and oriented, in no acute distress. Pulm:  Breathing unlabored. Psy:  Normal mood, congruent affect  Low back: No lumbar tenderness.  She is very tender over the left SI joint and in the sacral/coccygeal area.  Straight leg raise is negative, lower extremity strength and reflexes are normal.  Imaging: XR Sacrum/Coccyx  Result Date: 11/23/2020 X-rays of the sacrum/coccyx reveal anatomic alignment, no obvious stress fracture, no sign of neoplasm.   Assessment & Plan: 1.  Left sacroiliac dysfunction and sacrum/coccyx pain status post childbirth, cannot rule out stress fracture. -We will try physical therapy at [facility].  Baclofen and meloxicam as needed.  If she fails to  improve, then MRI of the pelvis.     Procedures: No procedures performed        PMFS History: Patient Active Problem List   Diagnosis Date Noted  . Vaginal delivery 07/19/2020  . 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 Post-dates pregnancy 07/17/2020  . Sickle cell trait ([place]) 02/22/2020  . Supervision of high risk pregnancy, antepartum 01/25/2020  . Gestational choriocarcinoma ([place]) in 2017 01/25/2020  . History of CVA (cerebrovascular accident) 01/25/2020   Past Medical History:  Diagnosis Date  . Cancer ([place])   . Hypertension   . Stroke [facility])     Family History  Problem Relation Age of Onset  . Varicose Veins Mother   . Hypertension Father   . Cancer Father        prostate    Past Surgical History:  Procedure Laterality Date  . LIVER BIOPSY  2017  . LUNG BIOPSY  2017   Social History   Occupational History  . Not on file  Tobacco Use  . Smoking status: Never Smoker  . Smokeless tobacco: Never Used  Vaping Use  . Vaping Use: Never used  Substance and Sexual Activity  . Alcohol use: Not Currently  . Drug use: Never  . Sexual activity: Yes    Birth control/protection: Injection  Comment: DEPO-PROVERA

## 2020-12-18 NOTE — Progress Notes (Signed)
Patient ID: Janice Stewart, female    DOB: Apr 18, 1986  MRN: [id number]  CC: Annual Physical Exam  Subjective: Janice Stewart is a 35 y.o. female who presents for annual physical exam. Her concerns today include:   1. HYPERTENSION FOLLOW-UP: 11/15/2020: - Home blood pressure readings are higher than goal, goal < 130/80. - Increase Amlodipine from 5 mg daily to 10 mg daily.  - Follow-up with primary provider in 4 weeks or sooner if needed.   12/19/2020: Currently taking: see medication list Have you taken your blood pressure medication today: []  Yes [x]  No  Med Adherence: [x]  No, reports she quit taking Amlodipine several days ago because she read on the Internet that it can cause hair loss. Reports since quitting medication she is still having hair loss.  Adherence with salt restriction (low-salt diet): [x]  Yes    []  No Exercise: Yes [x]  No []  Smoking []  Yes [x]  No SOB? []  Yes    [x]  No Chest Pain?: []  Yes    [x]  No Leg swelling?: []  Yes    [x]  No  2. HAIR SHEDDING:  Reports hair shedding since January 2022. Recently worsening.   Patient Active Problem List   Diagnosis Date Noted  . Vaginal delivery 07/19/2020  . 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 Post-dates pregnancy 07/17/2020  . Sickle cell trait ([place]) 02/22/2020  . Supervision of high risk pregnancy, antepartum 01/25/2020  . Gestational choriocarcinoma ([place]) in 2017 01/25/2020  . History of CVA (cerebrovascular accident) 01/25/2020     Current Outpatient Medications on File Prior to Visit  Medication Sig Dispense Refill  . baclofen (LIORESAL) 10 MG tablet Take 0.5-1 tablets (5-10 mg total) by mouth 3 (three) times daily as needed for muscle spasms. 30 each 3  . meloxicam (MOBIC) 15 MG tablet Take 0.5-1 tablets (7.5-15 mg total) by mouth daily as needed for pain. 30 tablet 6   No current facility-administered medications on file prior to visit.    No Known Allergies  Social History   Socioeconomic  History  . Marital status: Single    Spouse name: Not on file  . Number of children: Not on file  . Years of education: Not on file  . Highest education level: Not on file  Occupational History  . Not on file  Tobacco Use  . Smoking status: Never Smoker  . Smokeless tobacco: Never Used  Vaping Use  . Vaping Use: Never used  Substance and Sexual Activity  . Alcohol use: Not Currently  . Drug use: Never  . Sexual activity: Yes    Birth control/protection: Injection    Comment: DEPO-PROVERA  Other Topics Concern  . Not on file  Social History Narrative  . Not on file   Social Determinants of Health   Financial Resource Strain: Not on file  Food Insecurity: No Food Insecurity  . Worried About Charity fundraiser in the Last Year: Never true  . Ran Out of Food in the Last Year: Never true  Transportation Needs: No Transportation Needs  . Lack of Transportation (Medical): No  . Lack of Transportation (Non-Medical): No  Physical Activity: Not on file  Stress: Not on file  Social Connections: Not on file  Intimate Partner Violence: Not on file    Family History  Problem Relation Age of Onset  . Varicose Veins Mother   . Hypertension Father   . Cancer Father        prostate  Past Surgical History:  Procedure Laterality Date  . LIVER BIOPSY  2017  . LUNG BIOPSY  2017    ROS: Review of Systems Negative except as stated above  PHYSICAL EXAM: BP (!) 139/96 (BP Location: Left Arm, Patient Position: Sitting, Cuff Size: Normal)   Pulse 96   Temp 98.1 F (36.7 C)   Resp 16   Ht 5' 9.8" (1.773 m)   Wt 204 lb 6.4 oz (92.7 kg)   SpO2 98%   BMI 29.49 kg/m     Wt Readings from Last 3 Encounters:  12/19/20 204 lb 6.4 oz (92.7 kg)  11/15/20 205 lb 6.4 oz (93.2 kg)  10/05/20 204 lb 4.8 oz (92.7 kg)   Physical Exam HENT:     Head: Normocephalic and atraumatic.     Right Ear: Tympanic membrane, ear canal and external ear normal.     Left Ear: Tympanic membrane,  ear canal and external ear normal.     Nose: Nose normal.     Mouth/Throat:     Mouth: Mucous membranes are moist.     Pharynx: Oropharynx is clear.  Eyes:     Extraocular Movements: Extraocular movements intact.     Conjunctiva/sclera: Conjunctivae normal.     Pupils: Pupils are equal, round, and reactive to light.  Cardiovascular:     Rate and Rhythm: Normal rate and regular rhythm.     Pulses: Normal pulses.     Heart sounds: Normal heart sounds.  Pulmonary:     Effort: Pulmonary effort is normal.     Breath sounds: Normal breath sounds.  Chest:     Comments: Patient declined examination.  Abdominal:     General: Bowel sounds are normal.     Palpations: Abdomen is soft.  Genitourinary:    Comments: Patient declined examination.  Musculoskeletal:        General: Normal range of motion.     Cervical back: Normal range of motion and neck supple.  Skin:    General: Skin is warm and dry.     Capillary Refill: Capillary refill takes less than 2 seconds.  Neurological:     General: No focal deficit present.     Mental Status: She is alert and oriented to person, place, and time.  Psychiatric:        Mood and Affect: Mood normal.        Behavior: Behavior normal.    ASSESSMENT AND PLAN:  1. Annual physical exam: - Counseled on 150 minutes of exercise per week as tolerated, healthy eating (including decreased daily intake of saturated fats, cholesterol, added sugars, sodium), STI prevention, and routine healthcare maintenance.  2. Screening for metabolic disorder: - CMP to check kidney function, liver function, and electrolyte balance.  - Comprehensive metabolic panel  3. Screening for deficiency anemia: - CBC to screen for anemia. - CBC  4. Diabetes mellitus screening: - Hemoglobin A1c to screen for pre-diabetes/diabetes. - Hemoglobin A1c  5. Screening cholesterol level: - Lipid panel to screen for high cholesterol.  - Lipid panel  6. Thyroid disorder screen: -  TSH to check thyroid function.  - TSH+T4F+T3Free  7. Essential hypertension: - Blood pressure not at goal during today's visit. Patient asymptomatic without chest pressure, chest pain, palpitations, shortness of breath, and worst headache of life. - Counseled patient that Amlodipine does not usually cause hair loss. Offered patient to begin trial of new blood pressure medication. Patient reports she would rather continue taking Amlodipine and that she will try  to avoid self-research on the Internet.  - Continue Amlodipine as prescribed.  Counseled on blood pressure goal of less than 130/80, low-sodium, DASH diet, medication compliance, 150 minutes of moderate intensity exercise per week as tolerated. Discussed medication compliance, adverse effects. - Follow-up with primary provider in 2 weeks or sooner if needed.  - amLODipine (NORVASC) 10 MG tablet; Take 1 tablet (10 mg total) by mouth daily.  Dispense: 30 tablet; Refill: 0  8. Thinning hair: - Ongoing since January 2022 and recently worsening.  - Today will collect baseline labs.  - Referral to Dermatology for further evaluation and management.  - Ambulatory referral to Dermatology    Patient was given the opportunity to ask questions.  Patient verbalized understanding of the plan and was able to repeat key elements of the plan. Patient was given clear instructions to go to Emergency Department or return to medical center if symptoms don't improve, worsen, or new problems develop.The patient verbalized understanding.   Orders Placed This Encounter  Procedures  . CBC  . Comprehensive metabolic panel  . Lipid panel  . TSH+T4F+T3Free  . Hemoglobin A1c  . Ambulatory referral to Dermatology     Requested Prescriptions   Signed Prescriptions Disp Refills  . amLODipine (NORVASC) 10 MG tablet 30 tablet 0    Sig: Take 1 tablet (10 mg total) by mouth daily.    Return in about 2 weeks (around 01/02/2021) for Telephone Visit  hypertension .  Camillia Herter, NP

## 2020-12-19 ENCOUNTER — Encounter: Payer: Self-pay | Admitting: Family

## 2020-12-19 ENCOUNTER — Ambulatory Visit (INDEPENDENT_AMBULATORY_CARE_PROVIDER_SITE_OTHER): Payer: No Typology Code available for payment source | Admitting: Family

## 2020-12-19 ENCOUNTER — Other Ambulatory Visit: Payer: Self-pay

## 2020-12-19 VITALS — BP 139/96 | HR 96 | Temp 98.1°F | Resp 16 | Ht 69.8 in | Wt 204.4 lb

## 2020-12-19 DIAGNOSIS — Z0000 Encounter for general adult medical examination without abnormal findings: Secondary | ICD-10-CM

## 2020-12-19 DIAGNOSIS — Z13228 Encounter for screening for other metabolic disorders: Secondary | ICD-10-CM | POA: Diagnosis not present

## 2020-12-19 DIAGNOSIS — Z131 Encounter for screening for diabetes mellitus: Secondary | ICD-10-CM

## 2020-12-19 DIAGNOSIS — Z1329 Encounter for screening for other suspected endocrine disorder: Secondary | ICD-10-CM

## 2020-12-19 DIAGNOSIS — Z130 Encounter for screening for diseases of the blood and blood-forming organs and certain disorders involving the immune mechanism: Secondary | ICD-10-CM | POA: Diagnosis not present

## 2020-12-19 DIAGNOSIS — L659 Nonscarring hair loss, unspecified: Secondary | ICD-10-CM

## 2020-12-19 DIAGNOSIS — I10 Essential (primary) hypertension: Secondary | ICD-10-CM

## 2020-12-19 DIAGNOSIS — Z13220 Encounter for screening for lipoid disorders: Secondary | ICD-10-CM

## 2020-12-19 NOTE — Patient Instructions (Signed)
 Annual physical exam and labs today.  Preventive Care 21-35 Years Old, Female Preventive care refers to lifestyle choices and visits with your health care provider that can promote health and wellness. This includes:  A yearly physical exam. This is also called an annual wellness visit.  Regular dental and eye exams.  Immunizations.  Screening for certain conditions.  Healthy lifestyle choices, such as: ? Eating a healthy diet. ? Getting regular exercise. ? Not using drugs or products that contain nicotine and tobacco. ? Limiting alcohol use. What can I expect for my preventive care visit? Physical exam Your health care provider may check your:  Height and weight. These may be used to calculate your BMI (body mass index). BMI is a measurement that tells if you are at a healthy weight.  Heart rate and blood pressure.  Body temperature.  Skin for abnormal spots. Counseling Your health care provider may ask you questions about your:  Past medical problems.  Family's medical history.  Alcohol, tobacco, and drug use.  Emotional well-being.  Home life and relationship well-being.  Sexual activity.  Diet, exercise, and sleep habits.  Work and work environment.  Access to firearms.  Method of birth control.  Menstrual cycle.  Pregnancy history. What immunizations do I need? Vaccines are usually given at various ages, according to a schedule. Your health care provider will recommend vaccines for you based on your age, medical history, and lifestyle or other factors, such as travel or where you work.   What tests do I need? Blood tests  Lipid and cholesterol levels. These may be checked every 5 years starting at age 20.  Hepatitis C test.  Hepatitis B test. Screening  Diabetes screening. This is done by checking your blood sugar (glucose) after you have not eaten for a while (fasting).  STD (sexually transmitted disease) testing, if you are at  risk.  BRCA-related cancer screening. This may be done if you have a family history of breast, ovarian, tubal, or peritoneal cancers.  Pelvic exam and Pap test. This may be done every 3 years starting at age 21. Starting at age 30, this may be done every 5 years if you have a Pap test in combination with an HPV test. Talk with your health care provider about your test results, treatment options, and if necessary, the need for more tests.   Follow these instructions at home: Eating and drinking  Eat a healthy diet that includes fresh fruits and vegetables, whole grains, lean protein, and low-fat dairy products.  Take vitamin and mineral supplements as recommended by your health care provider.  Do not drink alcohol if: ? Your health care provider tells you not to drink. ? You are pregnant, may be pregnant, or are planning to become pregnant.  If you drink alcohol: ? Limit how much you have to 0-1 drink a day. ? Be aware of how much alcohol is in your drink. In the [place] one drink equals one 12 oz bottle of beer (355 mL), one 5 oz glass of wine (148 mL), or one 1 oz glass of hard liquor (44 mL).   Lifestyle  Take daily care of your teeth and gums. Brush your teeth every morning and night with fluoride toothpaste. Floss one time each day.  Stay active. Exercise for at least 30 minutes 5 or more days each week.  Do not use any products that contain nicotine or tobacco, such as cigarettes, e-cigarettes, and chewing tobacco. If you need help quitting,   ask your health care provider.  Do not use drugs.  If you are sexually active, practice safe sex. Use a condom or other form of protection to prevent STIs (sexually transmitted infections).  If you do not wish to become pregnant, use a form of birth control. If you plan to become pregnant, see your health care provider for a prepregnancy visit.  Find healthy ways to cope with stress, such as: ? Meditation, yoga, or listening to  music. ? Journaling. ? Talking to a trusted person. ? Spending time with friends and family. Safety  Always wear your seat belt while driving or riding in a vehicle.  Do not drive: ? If you have been drinking alcohol. Do not ride with someone who has been drinking. ? When you are tired or distracted. ? While texting.  Wear a helmet and other protective equipment during sports activities.  If you have firearms in your house, make sure you follow all gun safety procedures.  Seek help if you have been physically or sexually abused. What's next?  Go to your health care provider once a year for an annual wellness visit.  Ask your health care provider how often you should have your eyes and teeth checked.  Stay up to date on all vaccines. This information is not intended to replace advice given to you by your health care provider. Make sure you discuss any questions you have with your health care provider. Document Revised: 03/27/2020 Document Reviewed: 04/10/2018 Elsevier Patient Education  2021 [employer].

## 2020-12-19 NOTE — Progress Notes (Signed)
Physical Pt experiencing hair loss so stopped taking Amlodipine

## 2020-12-20 LAB — COMPREHENSIVE METABOLIC PANEL
ALT: 11 IU/L (ref 0–32)
AST: 18 IU/L (ref 0–40)
Albumin/Globulin Ratio: 1.6 (ref 1.2–2.2)
Albumin: 4.7 g/dL (ref 3.8–4.8)
Alkaline Phosphatase: 70 IU/L (ref 44–121)
BUN/Creatinine Ratio: 10 (ref 9–23)
BUN: 9 mg/dL (ref 6–20)
Bilirubin Total: 0.3 mg/dL (ref 0.0–1.2)
CO2: 19 mmol/L — ABNORMAL LOW (ref 20–29)
Calcium: 9.4 mg/dL (ref 8.7–10.2)
Chloride: 106 mmol/L (ref 96–106)
Creatinine, Ser: 0.86 mg/dL (ref 0.57–1.00)
Globulin, Total: 2.9 g/dL (ref 1.5–4.5)
Glucose: 71 mg/dL (ref 65–99)
Potassium: 4.4 mmol/L (ref 3.5–5.2)
Sodium: 141 mmol/L (ref 134–144)
Total Protein: 7.6 g/dL (ref 6.0–8.5)
eGFR: 90 mL/min/{1.73_m2} (ref >59)

## 2020-12-20 LAB — CBC
Hematocrit: 33.4 % — ABNORMAL LOW (ref 34.0–46.6)
Hemoglobin: 11.2 g/dL (ref 11.1–15.9)
MCH: 28.1 pg (ref 26.6–33.0)
MCHC: 33.5 g/dL (ref 31.5–35.7)
MCV: 84 fL (ref 79–97)
Platelets: 287 10*3/uL (ref 150–450)
RBC: 3.99 x10E6/uL (ref 3.77–5.28)
RDW: 14 % (ref 11.7–15.4)
WBC: 9 10*3/uL (ref 3.4–10.8)

## 2020-12-20 LAB — TSH+T4F+T3FREE
Free T4: 1.12 ng/dL (ref 0.82–1.77)
T3, Free: 2.9 pg/mL (ref 2.0–4.4)
TSH: 1.39 u[IU]/mL (ref 0.450–4.500)

## 2020-12-20 LAB — LIPID PANEL
Chol/HDL Ratio: 3.5 ratio (ref 0.0–4.4)
Cholesterol, Total: 206 mg/dL — ABNORMAL HIGH (ref 100–199)
HDL: 59 mg/dL (ref >39)
LDL Chol Calc (NIH): 134 mg/dL — ABNORMAL HIGH (ref 0–99)
Triglycerides: 71 mg/dL (ref 0–149)
VLDL Cholesterol Cal: 13 mg/dL (ref 5–40)

## 2020-12-20 LAB — HEMOGLOBIN A1C
Est. average glucose Bld gHb Est-mCnc: 103 mg/dL
Hgb A1c MFr Bld: 5.2 % (ref 4.8–5.6)

## 2020-12-20 MED ORDER — AMLODIPINE BESYLATE 10 MG PO TABS: 10.0000 mg | ORAL_TABLET | Freq: Every day | ORAL | 0 refills | Status: DC

## 2020-12-20 NOTE — Progress Notes (Signed)
Kidney function normal.   Liver function normal.   Thyroid function normal.   No diabetes.   Mild anemia. Increase iron-rich foods such as dark green leafy vegetables and dried fruit such as raisins and apricots.   Cholesterol higher than expected. High cholesterol may increase risk of heart attack and/or stroke. Consider eating more fruits, vegetables, and lean baked meats such as chicken or fish. Moderate intensity exercise at least 150 minutes as tolerated per week may help as well. Patient encouraged to have rechecked in 3 to 6 months.

## 2021-01-02 ENCOUNTER — Other Ambulatory Visit: Payer: Self-pay

## 2021-01-02 ENCOUNTER — Ambulatory Visit (INDEPENDENT_AMBULATORY_CARE_PROVIDER_SITE_OTHER): Payer: No Typology Code available for payment source

## 2021-01-02 VITALS — BP 121/77 | HR 67 | Wt 203.4 lb

## 2021-01-02 DIAGNOSIS — Z3042 Encounter for surveillance of injectable contraceptive: Secondary | ICD-10-CM | POA: Diagnosis not present

## 2021-01-02 DIAGNOSIS — R300 Dysuria: Secondary | ICD-10-CM

## 2021-01-02 LAB — POCT URINALYSIS DIP (DEVICE)
Bilirubin Urine: NEGATIVE
Glucose, UA: NEGATIVE mg/dL
Ketones, ur: NEGATIVE mg/dL
Nitrite: NEGATIVE
Protein, ur: NEGATIVE mg/dL
Specific Gravity, Urine: 1.02 (ref 1.005–1.030)
Urobilinogen, UA: 0.2 mg/dL (ref 0.0–1.0)
pH: 6 (ref 5.0–8.0)

## 2021-01-02 MED ORDER — MEDROXYPROGESTERONE ACETATE 150 MG/ML IM SUSP
150.0000 mg | Freq: Once | INTRAMUSCULAR | Status: AC
  Administered 2021-01-02: 150 mg via INTRAMUSCULAR

## 2021-01-02 MED ORDER — NITROFURANTOIN MONOHYD MACRO 100 MG PO CAPS: 100.0000 mg | ORAL_CAPSULE | Freq: Two times a day (BID) | ORAL | 0 refills | Status: DC

## 2021-01-02 MED ORDER — PHENAZOPYRIDINE HCL 200 MG PO TABS: 200.0000 mg | ORAL_TABLET | Freq: Three times a day (TID) | ORAL | 0 refills | Status: DC | PRN

## 2021-01-02 NOTE — Progress Notes (Addendum)
Saxon here for Depo-Provera Injection. Injection administered without complication. Patient will return in 3 months for next injection between 03/20/21 and 04/03/21. Next annual visit due February 2023.  Complains of burning with urination, frequency, and new foul odor. UA suspicious for UTI. Urine culture ordered. Macrobid and pyridium ordered per protocol. Explained that our office will contact pt if she needs any additional antibiotics.   [name], RN 01/02/2021  9:51 AM    Chart reviewed for nurse visit. Agree with plan of care.   Virginia Rochester, NP 01/02/2021 12:58 PM    Addendum: Pt reported not taking BP for last week. Has begun taking garlic in place of medication. I instructed pt to follow up with PCP tomorrow regarding need to continue BP med. Apolonio Schneiders RN 01/02/21 1635

## 2021-01-02 NOTE — Progress Notes (Signed)
Virtual Visit via Telephone Note  I connected with Janice Stewart, on 01/03/2021 at 8:39 AM by telephone due to the COVID-19 pandemic and verified that I am speaking with the correct person using two identifiers.  Due to current restrictions/limitations of in-office visits due to the COVID-19 pandemic, this scheduled clinical appointment was converted to a telehealth visit.   Consent: I discussed the limitations, risks, security and privacy concerns of performing an evaluation and management service by telephone and the availability of in person appointments. I also discussed with the patient that there may be a patient responsible charge related to this service. The patient expressed understanding and agreed to proceed.   Location of Patient: Home  Location of Provider: [facility] [place] participating in Telemedicine visit: [place], NP Elmon Else, CMA   History of Present Illness: Janice Stewart is a 35 year-old female who presents for hypertension follow-up.  1. HYPERTENSION FOLLOW-UP: 12/19/2020: - Counseled patient that Amlodipine does not usually cause hair loss. Offered patient to begin trial of new blood pressure medication. Patient reports she would rather continue taking Amlodipine and that she will try to avoid self-research on the Internet.  - Continue Amlodipine as prescribed.  - Follow-up with primary provider in 2 weeks or sooner if needed.   01/03/2021:  Reports 1 week ago she quit taking blood pressure medication. Using 1 clove raw garlic daily as a replacement and reports blood pressure has decreased. She would like to continue with the garlic regimen for 1 month and hold taking the blood pressure medication for now.   Med Adherence: []  Yes    [x]  No Adherence with salt restriction (low-salt diet): [x]  Yes    []  No Exercise: Yes [x]  No []  Home Monitoring?: [x]  Yes    []  No Home BP  results range: [x]  Yes, 120's/70's Smoking []  Yes [x]  No SOB? []  Yes    [x]  No Chest Pain?: []  Yes    [x]  No Leg swelling?: []  Yes    [x]  No     Past Medical History:  Diagnosis Date  . Cancer ([place])   . Hypertension   . Stroke [facility])    No Known Allergies  Current Outpatient Medications on File Prior to Visit  Medication Sig Dispense Refill  . amLODipine (NORVASC) 10 MG tablet Take 1 tablet (10 mg total) by mouth daily. 30 tablet 0  . baclofen (LIORESAL) 10 MG tablet Take 0.5-1 tablets (5-10 mg total) by mouth 3 (three) times daily as needed for muscle spasms. (Patient not taking: Reported on 01/02/2021) 30 each 3  . meloxicam (MOBIC) 15 MG tablet Take 0.5-1 tablets (7.5-15 mg total) by mouth daily as needed for pain. 30 tablet 6  . nitrofurantoin, macrocrystal-monohydrate, (MACROBID) 100 MG capsule Take 1 capsule (100 mg total) by mouth 2 (two) times daily. 10 capsule 0  . phenazopyridine (PYRIDIUM) 200 MG tablet Take 1 tablet (200 mg total) by mouth 3 (three) times daily as needed for pain. 6 tablet 0   No current facility-administered medications on file prior to visit.    Observations/Objective: Alert and oriented x 3. Not in acute distress. Physical examination not completed as this is a telemedicine visit.  Assessment and Plan: 1. Essential hypertension: - Patient reports 1 week ago she quit taking Amlodipine. Since then using 1 clove raw garlic daily as a replacement and reports blood pressure have decreased to 120's/70's.  She would like to continue with the garlic regimen for 1 month and hold taking the blood pressure medication for now.   - Counseled on blood pressure goal of less than 130/80, low-sodium, DASH diet, medication compliance, 150 minutes of moderate intensity exercise per week as tolerated. Discussed medication compliance, adverse effects. - Follow-up with primary provider in 4 weeks or sooner if needed.    Follow Up Instructions: Follow-up with primary  provider in 4 weeks or sooner if needed.    Patient was given clear instructions to go to Emergency Department or return to medical center if symptoms don't improve, worsen, or new problems develop.The patient verbalized understanding.  I discussed the assessment and treatment plan with the patient. The patient was provided an opportunity to ask questions and all were answered. The patient agreed with the plan and demonstrated an understanding of the instructions.   The patient was advised to call back or seek an in-person evaluation if the symptoms worsen or if the condition fails to improve as anticipated.   I provided 10 minutes total of non-face-to-face time during this encounter.   Camillia Herter, NP  [facility] [place], [place] 01/03/2021, 8:39 AM

## 2021-01-03 ENCOUNTER — Telehealth (INDEPENDENT_AMBULATORY_CARE_PROVIDER_SITE_OTHER): Payer: No Typology Code available for payment source | Admitting: Family

## 2021-01-03 DIAGNOSIS — I10 Essential (primary) hypertension: Secondary | ICD-10-CM | POA: Diagnosis not present

## 2021-01-03 NOTE — Progress Notes (Signed)
HTN Pt stopped taking BP meds a week ago, stated that she tried 1 clove raw garlic every morning and noticed that BP readings are now down

## 2021-01-05 LAB — URINE CULTURE

## 2021-01-16 ENCOUNTER — Other Ambulatory Visit: Payer: Self-pay

## 2021-01-16 ENCOUNTER — Ambulatory Visit (INDEPENDENT_AMBULATORY_CARE_PROVIDER_SITE_OTHER): Payer: No Typology Code available for payment source

## 2021-01-16 VITALS — BP 123/92 | HR 67 | Ht 69.0 in | Wt 203.6 lb

## 2021-01-16 DIAGNOSIS — N3001 Acute cystitis with hematuria: Secondary | ICD-10-CM

## 2021-01-16 MED ORDER — CEPHALEXIN 500 MG PO CAPS: 500.0000 mg | ORAL_CAPSULE | Freq: Four times a day (QID) | ORAL | 0 refills | Status: AC

## 2021-01-16 MED ORDER — CEPHALEXIN 500 MG PO CAPS: 500.0000 mg | ORAL_CAPSULE | Freq: Four times a day (QID) | ORAL | 2 refills | Status: DC

## 2021-01-16 NOTE — Progress Notes (Signed)
[name], odorous urine, cloudy, frequency, [name]

## 2021-01-16 NOTE — Progress Notes (Signed)
Attestation of Attending Supervision of clinical support staff: I agree with the care provided to this patient and was available for any consultation.  I have reviewed the RN's note and chart. I was available for consult and to see the patient if needed.   Patient with classic UTI sx and UA positive. Recently treated with macrobid and likely needs bacteriocidal abx. Sent in [place].   Caren Macadam, MD, MPH, ABFM Attending [place] for [facility]

## 2021-02-07 ENCOUNTER — Other Ambulatory Visit: Payer: Self-pay

## 2021-02-07 ENCOUNTER — Encounter: Payer: Self-pay | Admitting: Family

## 2021-02-07 ENCOUNTER — Ambulatory Visit (INDEPENDENT_AMBULATORY_CARE_PROVIDER_SITE_OTHER): Payer: No Typology Code available for payment source | Admitting: Family

## 2021-02-07 VITALS — BP 118/78 | HR 79 | Temp 98.0°F | Resp 16 | Ht 69.8 in | Wt 203.2 lb

## 2021-02-07 DIAGNOSIS — I10 Essential (primary) hypertension: Secondary | ICD-10-CM

## 2021-02-07 NOTE — Progress Notes (Signed)
Patient ID: Janice Stewart, female    DOB: 1986-07-13  MRN: [id number]  CC: Hypertension Follow-Up  Subjective: Janice Stewart is a 35 y.o. female who presents for hypertension follow-up.   Her concerns today include:   HYPERTENSION FOLLOW-UP: 01/03/2021: - Patient reports 1 week ago she quit taking Amlodipine. Since then using 1 clove raw garlic daily as a replacement and reports blood pressure have decreased to 120's/70's. She would like to continue with the garlic regimen for 1 month and hold taking the blood pressure medication for now.   - Counseled on blood pressure goal of less than 130/80, low-sodium, DASH diet, medication compliance, 150 minutes of moderate intensity exercise per week as tolerated. Discussed medication compliance, adverse effects. - Follow-up with primary provider in 4 weeks or sooner if needed.   6/83/4196: Still on garlic regimen and prefers to continue. Slicing 1 clove and placing in water to drink. Home blood pressures are remaining normal.  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 (Patient not taking: No sig reported) 30 tablet 0   meloxicam (MOBIC) 15 MG tablet Take 0.5-1 tablets (7.5-15 mg total) by mouth daily as needed for pain. (Patient not taking: No sig reported) 30 tablet 6   No current facility-administered medications on file prior to visit.    No Known Allergies  Social History   Socioeconomic History   Marital status: Single    Spouse  name: Not on file   Number of children: Not on file   Years of education: Not on file   Highest education level: Not on file  Occupational History   Not on file  Tobacco Use   Smoking status: Never   Smokeless tobacco: Never  Vaping Use   Vaping Use: Never used  Substance and Sexual Activity   Alcohol use: Not Currently   Drug use: Never   Sexual activity: Yes    Birth control/protection: Injection    Comment: DEPO-PROVERA  Other Topics Concern   Not on file  Social History Narrative   Not on file   Social Determinants of Health   Financial Resource Strain: Not on file  Food Insecurity: No Food Insecurity   Worried About Running Out of Food in the Last Year: Never true   [place] in the Last Year: Never true  Transportation Needs: No Transportation Needs   Lack of Transportation (Medical): No   Lack of Transportation (Non-Medical): No  Physical Activity: Not on file  Stress: Not on file  Social Connections: Not on file  Intimate Partner Violence: Not on file    Family History  Problem Relation Age of Onset   Varicose Veins Mother    Hypertension Father    Cancer Father        prostate    Past Surgical History:  Procedure Laterality Date   LIVER BIOPSY  2017   LUNG BIOPSY  2017    ROS: Review of Systems Negative except as stated above  PHYSICAL EXAM:  BP 118/78 (BP Location: Left Arm, Patient Position: Sitting, Cuff Size: Normal)   Pulse 79   Temp 98 F (36.7 C)   Resp 16   Ht 5' 9.8" (1.773 m)   Wt 203 lb 3.2 oz (92.2 kg)   LMP  (LMP Unknown)   SpO2 98%   BMI 29.32 kg/m   Physical Exam General appearance - alert, well appearing, and in no distress and oriented to person, place, and time Mental status - alert, oriented to person, place, and time, normal mood, behavior, speech, dress, motor activity, and thought processes Chest - clear to auscultation, no wheezes, rales or rhonchi, symmetric air entry, no tachypnea, retractions or  cyanosis Heart - normal rate, regular rhythm, normal S1, S2, no murmurs, rubs, clicks or gallops Neurological - alert, oriented, normal speech, no focal findings or movement disorder noted   ASSESSMENT AND PLAN: 1. Essential hypertension: - Patient still on garlic regimen and prefers to continue. She is slicing 1 clove and placing in water to drink. Home blood pressures are remaining normal. - Counseled to continue to monitor blood pressures at home and if blood pressures are remaining above goal of 130's/80's follow-up with me for an appointment. Patient verbalized understanding.   Patient was given the opportunity to ask questions.  Patient verbalized understanding of the plan and was able to repeat key elements of the plan. Patient was given clear instructions to go to Emergency Department or return to medical center if symptoms don't improve, worsen, or new problems develop.The patient verbalized understanding.  Follow-up with primary provider as scheduled.   Camillia Herter, NP

## 2021-02-07 NOTE — Progress Notes (Signed)
Pt presents for hypertension f/u  Pt request refill on meloxicam

## 2021-03-20 ENCOUNTER — Ambulatory Visit (INDEPENDENT_AMBULATORY_CARE_PROVIDER_SITE_OTHER): Payer: No Typology Code available for payment source | Admitting: *Deleted

## 2021-03-20 ENCOUNTER — Other Ambulatory Visit: Payer: Self-pay

## 2021-03-20 VITALS — BP 138/89 | HR 81 | Ht 69.0 in | Wt 205.8 lb

## 2021-03-20 DIAGNOSIS — Z3042 Encounter for surveillance of injectable contraceptive: Secondary | ICD-10-CM | POA: Diagnosis not present

## 2021-03-20 MED ORDER — MEDROXYPROGESTERONE ACETATE 150 MG/ML IM SUSP
150.0000 mg | Freq: Once | INTRAMUSCULAR | Status: AC
  Administered 2021-03-20: 150 mg via INTRAMUSCULAR

## 2021-03-20 NOTE — Progress Notes (Signed)
Here for nurse visit for depo-provera injection. Last received depo- provera 01/02/21. Last pap 01/17/20. Last exam was postpartum visit 10/05/20. Injection given without complaint. Reports still has bleeding 1-2 days a week every week. Would like to change birth control. Advised to make appointment with provider before next depo-provera due. She voices understanding. Drue Harr,RN

## 2021-03-20 NOTE — Progress Notes (Signed)
Agree with A & P. 

## 2021-04-19 ENCOUNTER — Encounter: Payer: Self-pay | Admitting: Nurse Practitioner

## 2021-04-19 ENCOUNTER — Other Ambulatory Visit: Payer: Self-pay

## 2021-04-19 ENCOUNTER — Ambulatory Visit (INDEPENDENT_AMBULATORY_CARE_PROVIDER_SITE_OTHER): Payer: No Typology Code available for payment source | Admitting: Nurse Practitioner

## 2021-04-19 VITALS — BP 146/90 | HR 81 | Ht 69.0 in | Wt 208.6 lb

## 2021-04-19 DIAGNOSIS — Z30011 Encounter for initial prescription of contraceptive pills: Secondary | ICD-10-CM

## 2021-04-19 DIAGNOSIS — O0289 Other abnormal products of conception: Secondary | ICD-10-CM

## 2021-04-19 DIAGNOSIS — C58 Malignant neoplasm of placenta: Secondary | ICD-10-CM

## 2021-04-19 DIAGNOSIS — Z8673 Personal history of transient ischemic attack (TIA), and cerebral infarction without residual deficits: Secondary | ICD-10-CM | POA: Diagnosis not present

## 2021-04-19 DIAGNOSIS — R030 Elevated blood-pressure reading, without diagnosis of hypertension: Secondary | ICD-10-CM

## 2021-04-19 DIAGNOSIS — N939 Abnormal uterine and vaginal bleeding, unspecified: Secondary | ICD-10-CM

## 2021-04-19 MED ORDER — NORETHINDRONE 0.35 MG PO TABS: 1.0000 | ORAL_TABLET | Freq: Every day | ORAL | 2 refills | Status: DC

## 2021-04-19 NOTE — Progress Notes (Signed)
   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  Got Megace for 2 weeks and bleeding stopped while on Megace.  Wants to change to pills.  She denies any abnormal vaginal discharge, bleeding, pelvic pain or other concerns.   Past Medical History:  Diagnosis Date   Cancer [facility])    Hypertension    Stroke [facility])      The following portions of the patient's history were reviewed and updated as appropriate: allergies, current medications, past family history, past medical history, past social history, past surgical history and problem list.   Health Maintenance:  Normal pap and negative HRHPV on 01-25-20.    Review of Systems:  Pertinent items noted in HPI and remainder of comprehensive ROS otherwise negative.  Objective:  Physical Exam BP (!) 146/90   Pulse 81   Ht '5\' 9"'$  (1.753 m)   Wt 208 lb 9.6 oz (94.6 kg)   LMP  (LMP Unknown)   Breastfeeding No   BMI 30.80 kg/m  CONSTITUTIONAL: Well-developed, well-nourished female in no acute distress.  HENT:  Normocephalic, atraumatic. External right and left ear normal.  EYES: Conjunctivae and EOM are normal. Pupils are equal, round.  No scleral icterus.  NECK: Normal range of motion, supple, no masses SKIN: Skin is warm and dry. No rash noted. Not diaphoretic. No erythema. No pallor. NEUROLOGIC: Alert and oriented to person, place, and time. Normal muscle tone coordination. No cranial nerve deficit noted. PSYCHIATRIC: Normal mood and affect. Normal behavior. Normal judgment and thought content. CARDIOVASCULAR: Normal heart rate noted RESPIRATORY: Effort and breath sounds normal, no problems with respiration noted ABDOMEN: Soft, no distention noted.   PELVIC: Deferred MUSCULOSKELETAL: Normal range of motion. No edema noted.  Labs and Imaging No results found.  Assessment & Plan:  Initiation of birth control pills Due to history of gestational choriocarceinoma  in 2017 - treated and resolved and history of CVA along with elevated BP today, will use progesterone only pills to help client control bleeding.  Not interested in IUD - had it before and had lots of vaginal infections.  Really wants to try pills.  Reviewed taking pills daily and at the same time each day.  Reviewed Depo will be back up contraception as getting started on pills as last dose was in August and bleeding has not stopped.  Has been on Depo since postpartum visit.  Elevated BP Advised to take BP daily and record.  Advised to see PCP with BP readings.  Has had elevated BP in the past but did not want to go on BP medication.  Was using home remedies and exercising to keep BP under control but has stopped doing those things.  Discussed self care and increasing exercise - common problem for when babies are young.  But her own BP being normal is important for her overall health.  Patient states she will see PCP.  Routine preventative health maintenance measures emphasized. Please refer to After Visit Summary for other counseling recommendations.   Return in about 3 months (around 07/19/2021) for BP recheck and pill refill.   Total face-to-face time with patient: 15 minutes.  Over 50% of encounter was spent on counseling and coordination of care.  Earlie Server, RN, MSN, NP-BC Nurse Practitioner, [facility] for [employer] 04/19/2021 8:31 PM

## 2021-06-05 ENCOUNTER — Encounter: Payer: Self-pay | Admitting: Physician Assistant

## 2021-06-05 ENCOUNTER — Ambulatory Visit (INDEPENDENT_AMBULATORY_CARE_PROVIDER_SITE_OTHER): Payer: No Typology Code available for payment source | Admitting: Physician Assistant

## 2021-06-05 ENCOUNTER — Other Ambulatory Visit: Payer: Self-pay

## 2021-06-05 DIAGNOSIS — G8929 Other chronic pain: Secondary | ICD-10-CM

## 2021-06-05 DIAGNOSIS — M533 Sacrococcygeal disorders, not elsewhere classified: Secondary | ICD-10-CM | POA: Diagnosis not present

## 2021-06-05 DIAGNOSIS — M545 Low back pain, unspecified: Secondary | ICD-10-CM | POA: Diagnosis not present

## 2021-06-05 NOTE — Progress Notes (Signed)
Office Visit Note   Patient: Janice Stewart           Date of Birth: November 10, 1985           MRN: [id number] Visit Date: 06/05/2021              Requested by: Camillia Herter, NP [address] [place],  [place] [address] PCP: Camillia Herter, NP  Chief Complaint  Patient presents with   Pelvis - Pain    Pain radiating up back now      HPI: Patient is a pleasant 35 year old woman with an 27-month history of SI joint pain following delivery of her baby in February.  She did not have any kind of symptoms prior to this.  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  She did not have any problems with her first pregnancy with regards to sacroiliac pain.  She was last seen and evaluated by Dr. Junius Roads who recommended and referred her to physical therapy.  While at physical therapy she did undergo dry needling and she said which helped her for short periods of time.  She was compliant with all of her home exercises and even begin walking as was suggested.  She states that the pain especially on the left did not get better and if everything its gotten worse.  She points to the pain located on the posterior lower back left greater than right.  She has absolutely no radiation to her legs no paresthesias no radiculopathy.  At his last visit Dr. Junius Roads had indicated that if she did not get better he would recommend referral to an MRI  Assessment & Plan: Visit Diagnoses:  1. Chronic midline low back pain, unspecified whether sciatica present   2. Sacral pain     Plan: I had a talk with the patient today.  I would like for her to get an MRI and then she will follow-up with Dr. Ernestina Patches to see if possibly injections could be helpful to her.  She has pain with activities of daily living and is having difficulty even lifting her baby up.  She is also having difficulty sleeping because of the discomfort.  In addition to PT she has also  failed anti-inflammatory treatment which she finds minimally helpful.  Follow-Up Instructions: No follow-ups on file.   Ortho Exam  Patient is alert, oriented, no adenopathy, well-dressed, normal affect, normal respiratory effort. Examination of her lower back she has no deformity no tenderness over the spinous processes.  She is tender over the left greater than right SI joint.  Deep palpation easily reproduces this pain.  She has negative straight leg raises bilaterally.  She has some reproduction of the pain when she flexes forward.  She has 5 out of 5 strength bilaterally with dorsiflexion plantarflexion sensation is intact  Imaging: No results found. No images are attached to the encounter.  Labs: Lab Results  Component Value Date   HGBA1C 5.2 12/19/2020     Lab Results  Component Value Date   ALBUMIN 4.7 12/19/2020   ALBUMIN 2.9 (L) 07/23/2020   ALBUMIN 4.0 01/25/2020    No results found for: MG No results found for: VD25OH  No results found for: PREALBUMIN CBC EXTENDED Latest Ref Rng & Units 12/19/2020 07/23/2020 07/17/2020  WBC 3.4 - 10.8 x10E3/uL 9.0 11.8(H) 10.2  RBC 3.77 - 5.28 x10E6/uL 3.99 3.40(L) 3.86(L)  HGB 11.1 -  15.9 g/dL 11.2 10.2(L) 11.1(L)  HCT 34.0 - 46.6 % 33.4(L) 28.4(L) 33.2(L)  PLT 150 - 450 x10E3/uL 287 334 233  NEUTROABS 1.7 - 7.7 K/uL - 7.6 -  LYMPHSABS 0.7 - 4.0 K/uL - 2.1 -     There is no height or weight on file to calculate BMI.  Orders:  Orders Placed This Encounter  Procedures   MR Pelvis w/o contrast   Ambulatory referral to Physical Medicine Rehab   No orders of the defined types were placed in this encounter.    Procedures: No procedures performed  Clinical Data: No additional findings.  ROS:  All other systems negative, except as noted in the HPI. Review of Systems  Objective: Vital Signs: There were no vitals taken for this visit.  Specialty Comments:  No specialty comments available.  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 01/25/2020   History of CVA (cerebrovascular accident) 01/25/2020   Past Medical History:  Diagnosis Date   Cancer ([place])    Hypertension    Stroke ([place])     Family History  Problem Relation Age of Onset   Varicose Veins Mother    Hypertension Father    Cancer Father        prostate    Past Surgical History:  Procedure Laterality Date   LIVER BIOPSY  2017   LUNG BIOPSY  2017   Social History   Occupational History   Not on file  Tobacco Use   Smoking status: Never   Smokeless tobacco: Never  Vaping Use   Vaping Use: Never used  Substance and Sexual Activity   Alcohol use: Not Currently   Drug use: Never   Sexual activity: Yes    Birth control/protection: Injection    Comment: DEPO-PROVERA

## 2021-06-14 ENCOUNTER — Ambulatory Visit (INDEPENDENT_AMBULATORY_CARE_PROVIDER_SITE_OTHER): Payer: No Typology Code available for payment source

## 2021-06-14 ENCOUNTER — Other Ambulatory Visit (HOSPITAL_COMMUNITY)
Admission: RE | Admit: 2021-06-14 | Discharge: 2021-06-14 | Disposition: A | Discharge status: Home / Self Care | Payer: No Typology Code available for payment source | Source: Ambulatory Visit | Attending: Family Medicine | Admitting: Family Medicine

## 2021-06-14 ENCOUNTER — Other Ambulatory Visit: Payer: Self-pay

## 2021-06-14 VITALS — BP 143/89 | HR 72 | Wt 209.5 lb

## 2021-06-14 DIAGNOSIS — N930 Postcoital and contact bleeding: Secondary | ICD-10-CM

## 2021-06-14 DIAGNOSIS — Z0130 Encounter for examination of blood pressure without abnormal findings: Secondary | ICD-10-CM

## 2021-06-14 DIAGNOSIS — Z3042 Encounter for surveillance of injectable contraceptive: Secondary | ICD-10-CM

## 2021-06-14 NOTE — Progress Notes (Signed)
[name] here for blood pressure check following initiation of oral contraceptives on 04/19/21; reports taking Micronor daily at 8 PM. BP today is 143/89.  Pt reports she has had vaginal bleeding since delivery on 07/18/20. Depo Provera used for contraception for 9 months following delivery. Switched to Micronor due to bleeding on Depo Provera. Since beginning Micronor reports vaginal bleeding ranges from spotting to light bleeding; changes pad twice a day with heaviest bleeding. When bleeding heavier, reports increased back pain; Tylenol and ibuprofen used for pain relief. Also reports bleeding after intercourse that patient feels is unrelated to other vaginal bleeding.   Reviewed with [name], MD who states pt may continue OCPs and follow up with PCP regarding birth control. Recommends vaginal swab to check for any infection that may be causing bleeding following intercourse. States her options are limited due to history of CVA. Explained that progesterone only contraceptives will likely continue to cause vaginal bleeding. Recommends I review IUD, BTL, or barrier methods for contraception. IUD may cause abnormal bleeding for 3-6 months, but this would be expected to resolve. Recommends pt follow up with provider for full discussion regarding contraceptive options. Reviewed provider recommendations with pt, given information regarding IUDs offered in office. Patient will follow up at provider visit with [name], MD on 06/26/21.  [name], RN 06/14/2021  9:10 AM

## 2021-06-14 NOTE — Progress Notes (Signed)
In addition to documentation above, we offered pt an appointment today for evaluation and discussed of BTB but would have to wait a bit until scheduled patients were seen.   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 Last pap last year was normal, HPV negative. Vaginal Cx for vaginitis and GC/CT checked today.   Radene Gunning, MD

## 2021-06-15 ENCOUNTER — Other Ambulatory Visit: Payer: Self-pay | Admitting: Obstetrics and Gynecology

## 2021-06-15 DIAGNOSIS — B9689 Other specified bacterial agents as the cause of diseases classified elsewhere: Secondary | ICD-10-CM

## 2021-06-15 LAB — CERVICOVAGINAL ANCILLARY ONLY
Bacterial Vaginitis (gardnerella): POSITIVE — AB
Candida Glabrata: NEGATIVE
Candida Vaginitis: NEGATIVE
Chlamydia: NEGATIVE
Comment: NEGATIVE
Comment: NEGATIVE
Comment: NEGATIVE
Comment: NEGATIVE
Comment: NEGATIVE
Comment: NORMAL
Neisseria Gonorrhea: NEGATIVE
Trichomonas: NEGATIVE

## 2021-06-15 MED ORDER — METRONIDAZOLE 0.75 % VA GEL: 1.0000 | Freq: Every day | VAGINAL | 1 refills | Status: DC

## 2021-06-24 ENCOUNTER — Ambulatory Visit
Admission: RE | Admit: 2021-06-24 | Discharge: 2021-06-24 | Disposition: A | Discharge status: Home / Self Care | Payer: No Typology Code available for payment source | Source: Ambulatory Visit | Attending: Physician Assistant | Admitting: Physician Assistant

## 2021-06-24 ENCOUNTER — Other Ambulatory Visit: Payer: Self-pay

## 2021-06-24 DIAGNOSIS — G8929 Other chronic pain: Secondary | ICD-10-CM

## 2021-06-24 DIAGNOSIS — M545 Low back pain, unspecified: Secondary | ICD-10-CM

## 2021-06-24 DIAGNOSIS — M533 Sacrococcygeal disorders, not elsewhere classified: Secondary | ICD-10-CM

## 2021-06-24 IMAGING — MR MR PELVIS W/O CM
5 series · 35 of 48 positions shown · non-contrast
Comparison: SI joint radiographs [DATE]

CLINICAL DATA: post partum si joint pain chronic.

EXAM:
MRI PELVIS WITHOUT CONTRAST
TECHNIQUE: Multiplanar multisequence MR imaging of the pelvis was performed. No
intravenous contrast was administered.

[Series 3: T2 fat-sat · sagittal · 4.0mm · 0.59mm/px · 8 of 67 slices shown (1 of 2)]
[im 1/67]
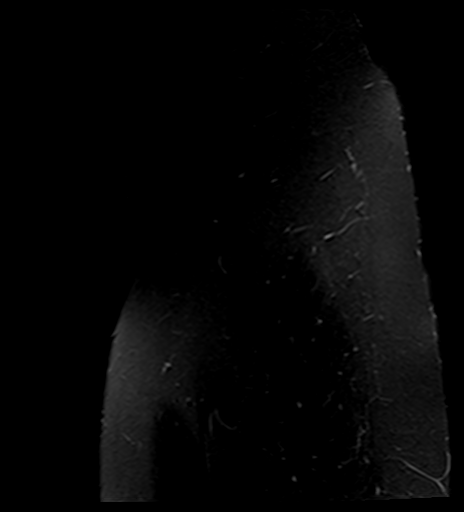
[im 11/67]
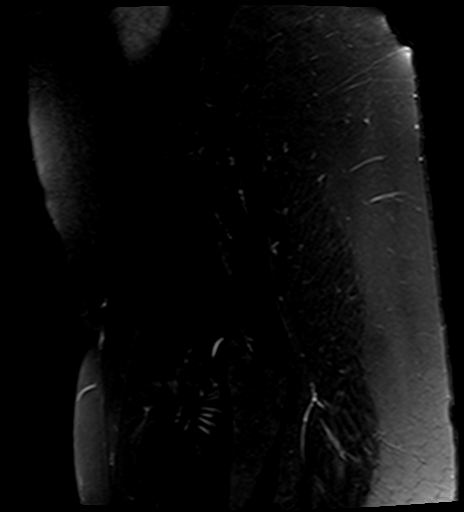
[im 21/67]
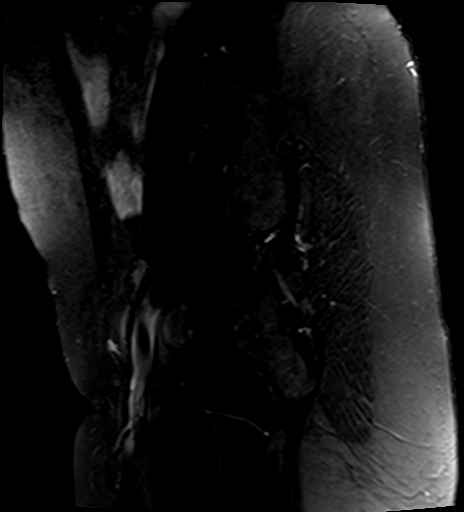
[im 31/67]
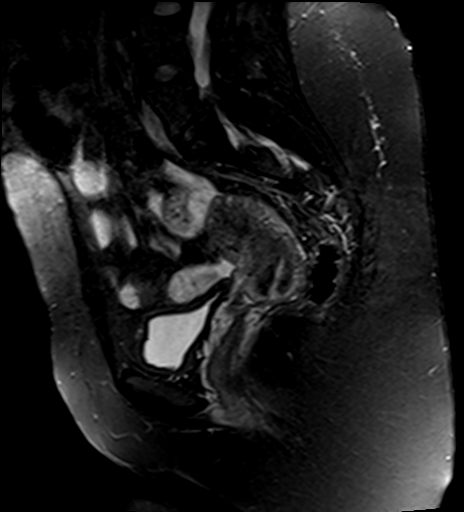
[im 36/67]
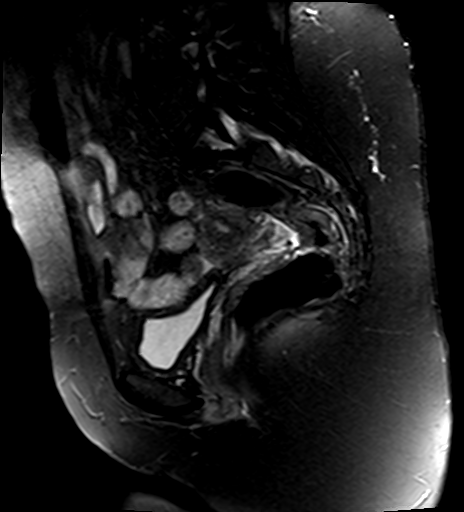
[im 46/67]
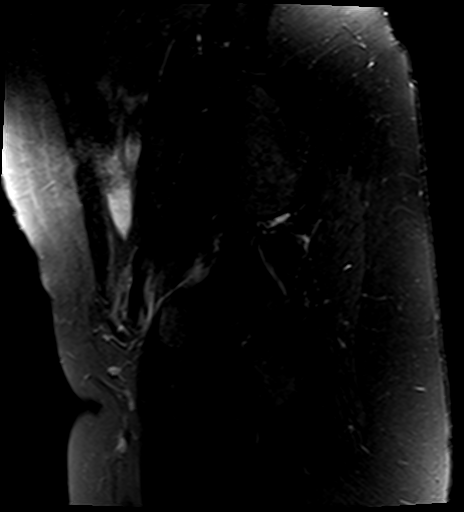
[im 56/67]
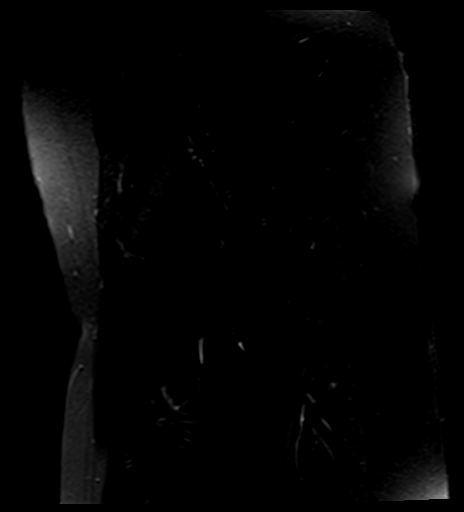
[im 67/67]
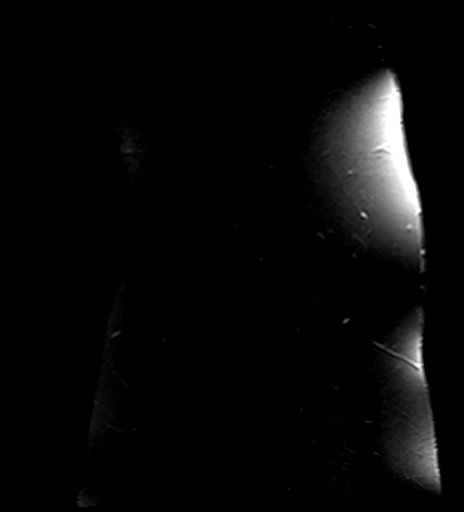

[Series 4: T1 · coronal · 4.0mm · 1.48mm/px · 7 of 39 slices shown (1 of 2)]
[im 1/39]
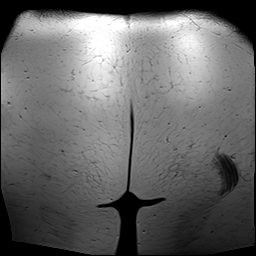
[im 7/39]
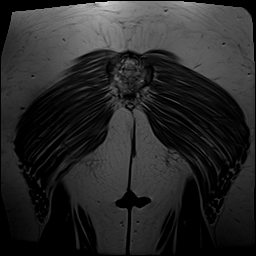
[im 13/39]
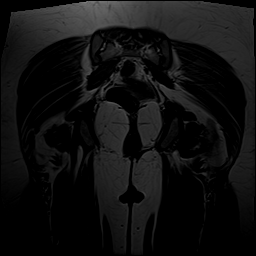
[im 20/39]
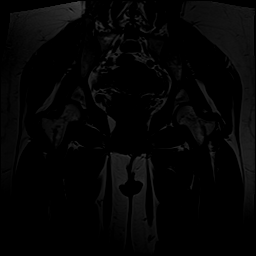
[im 26/39]
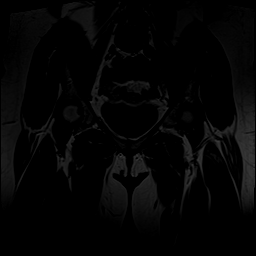
[im 32/39]
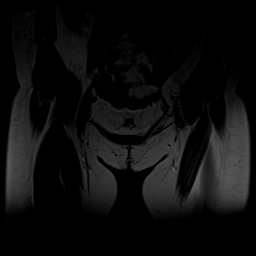
[im 39/39]
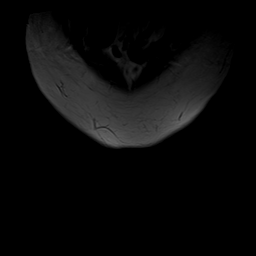

[Series 5: STIR · coronal · 4.0mm · 1.48mm/px · 7 of 39 slices shown]
[im 1/39]
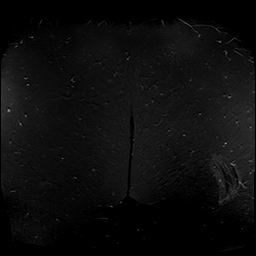
[im 7/39]
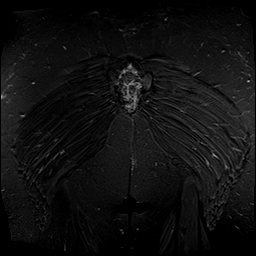
[im 13/39]
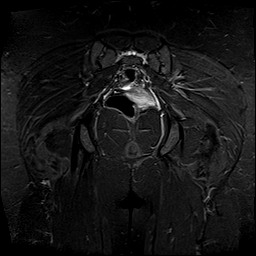
[im 20/39]
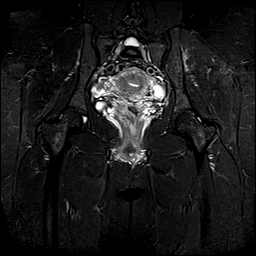
[im 26/39]
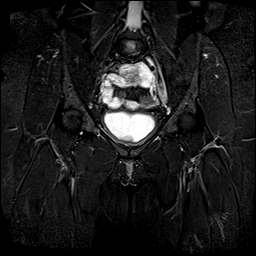
[im 32/39]
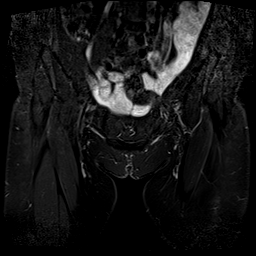
[im 39/39]
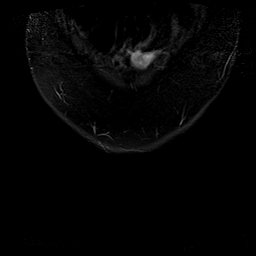

[Series 6: T2 fat-sat · axial · 4.0mm · 1.41mm/px · z∈[-100,+170]mm · 8 of 55 slices shown (2 of 2)]
[im 1/55]
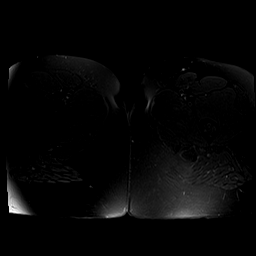
[im 7/55]
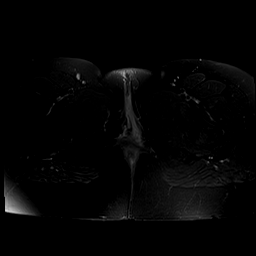
[im 19/55]
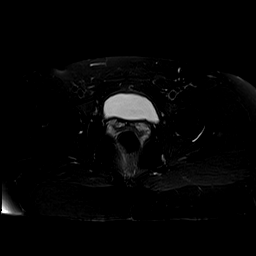
[im 25/55]
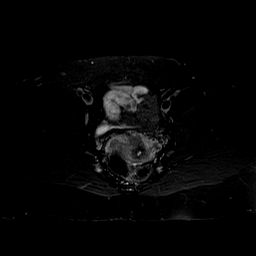
[im 31/55]
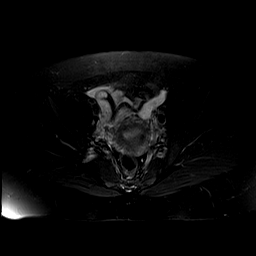
[im 37/55]
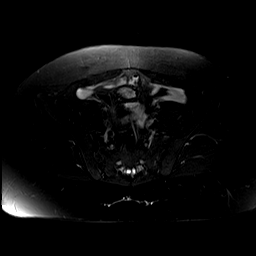
[im 49/55]
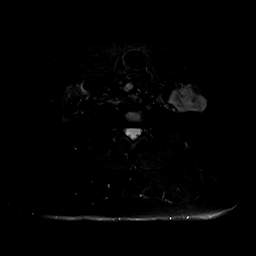
[im 55/55]
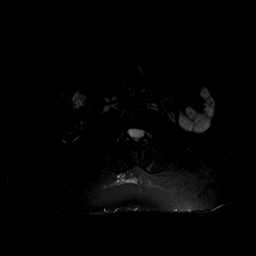

[Series 7: T1 · axial · 4.0mm · 0.70mm/px · z∈[-100,+50]mm · 5 of 55 slices shown (2 of 2)]
[im 1/55]
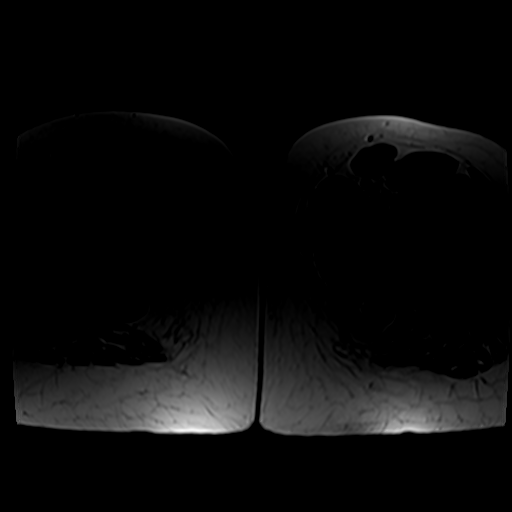
[im 7/55]
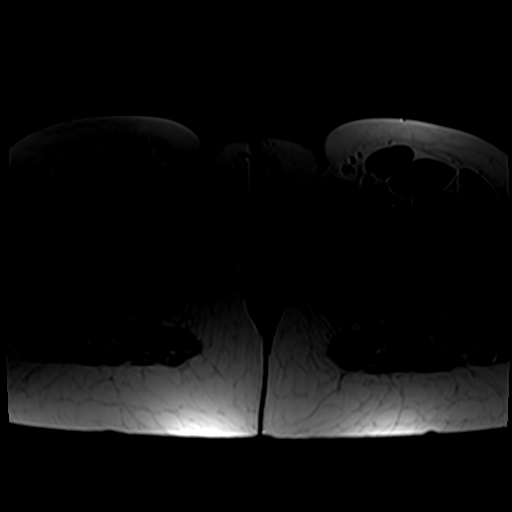
[im 19/55]
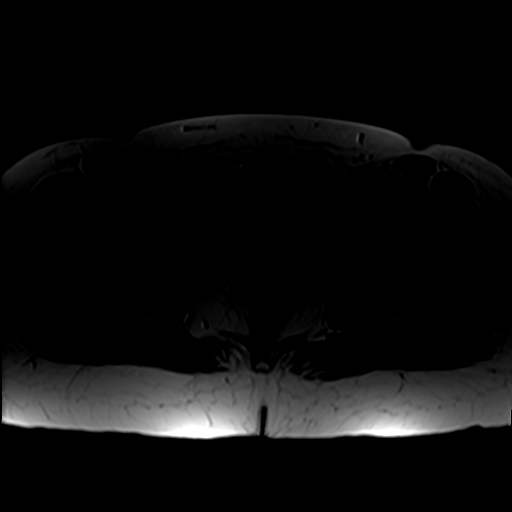
[im 25/55]
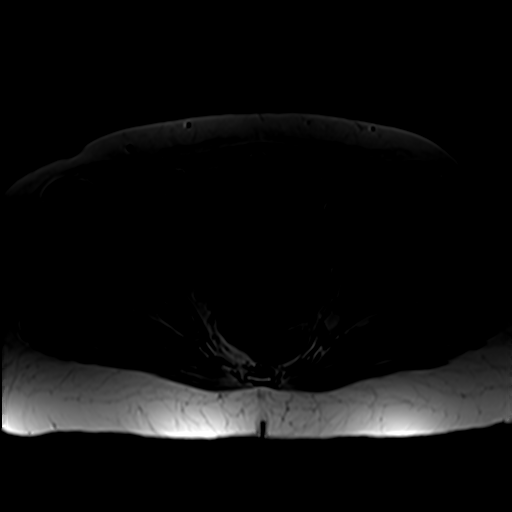
[im 31/55]
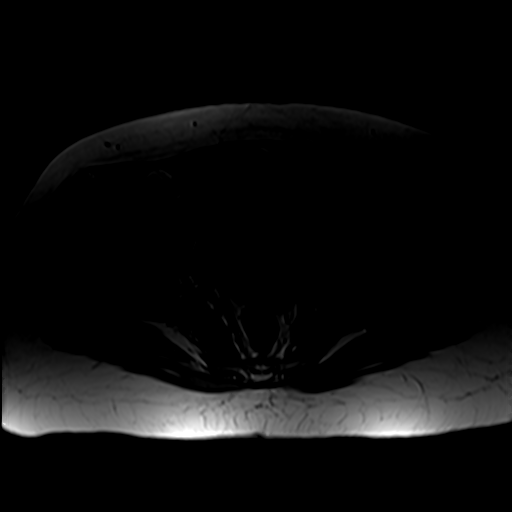

[35 of 48 positions shown; findings below may reference images not displayed]

FINDINGS: Bones/Joint/Cartilage

There is a well-circumscribed lesion within the S1 vertebrae
demonstrating internal high T2 signal with periphery of fat signal.
Margins appear somewhat angular. This measures 4.9 x 3.2 x 2.7 cm
(axial T2 image 15, sagittal T2 image 34). The sacroiliac joints are
unremarkable. The pubic symphysis is unremarkable. There is no
evidence of hip fracture. No evidence of femoral head avascular
necrosis.

Ligaments

Grossly intact

Muscles and Tendons
No muscle edema or atrophy. The gluteal tendons are intact. The
adductor muscles are unremarkable. The proximal hamstrings are
unremarkable.

Soft tissues

No focal fluid collection. Diastasis recti. The internal pelvic
structures are unremarkable. Dominant left ovarian follicle.
IMPRESSION: Well-circumscribed lesion within the S1 vertebrae measuring 4.9 x
3.2 x 2.7 cm, favored to represent an acute bone infarct. Consider
follow-up MRI in 3-6 months to assess for interval change.

No evidence of sacroiliitis.

## 2021-06-26 ENCOUNTER — Ambulatory Visit (INDEPENDENT_AMBULATORY_CARE_PROVIDER_SITE_OTHER): Payer: No Typology Code available for payment source | Admitting: Obstetrics and Gynecology

## 2021-06-26 ENCOUNTER — Other Ambulatory Visit (HOSPITAL_COMMUNITY)
Admission: RE | Admit: 2021-06-26 | Discharge: 2021-06-26 | Disposition: A | Discharge status: Home / Self Care | Payer: No Typology Code available for payment source | Source: Ambulatory Visit | Attending: Obstetrics and Gynecology | Admitting: Obstetrics and Gynecology

## 2021-06-26 ENCOUNTER — Other Ambulatory Visit: Payer: Self-pay

## 2021-06-26 ENCOUNTER — Encounter: Payer: Self-pay | Admitting: Obstetrics and Gynecology

## 2021-06-26 VITALS — BP 139/87 | HR 67 | Wt 211.5 lb

## 2021-06-26 DIAGNOSIS — Z3202 Encounter for pregnancy test, result negative: Secondary | ICD-10-CM | POA: Diagnosis not present

## 2021-06-26 DIAGNOSIS — C58 Malignant neoplasm of placenta: Secondary | ICD-10-CM | POA: Diagnosis not present

## 2021-06-26 DIAGNOSIS — N921 Excessive and frequent menstruation with irregular cycle: Secondary | ICD-10-CM | POA: Diagnosis present

## 2021-06-26 LAB — POCT PREGNANCY, URINE: Preg Test, Ur: NEGATIVE

## 2021-06-26 NOTE — Progress Notes (Signed)
GYNECOLOGY OFFICE VISIT NOTE  History:   Janice Stewart is a 35 y.o. [phone] here today for breakthrough bleeding on her POP. She was on Depo before this and she had BTB on that as well and that prompted her to switch. She also had PCB recently. She had cultures about 2 weeks ago and the cultures were positive for BV. She took the ABX and since that time her symptoms have improved.   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   She denies any abnormal vaginal discharge, bleeding, pelvic pain or other concerns.     Past Medical History:  Diagnosis Date   Cancer ([place])    Hypertension    Stroke [facility])     Past Surgical History:  Procedure Laterality Date   LIVER BIOPSY  2017   LUNG BIOPSY  2017    The following portions of the patient's history were reviewed and updated as appropriate: allergies, current medications, past family history, past medical history, past social history, past surgical history and problem list.   Health Maintenance:   Normal pap and negative HRHPV Diagnosis  Date Value Ref Range Status  01/25/2020   Final   - Negative for intraepithelial lesion or malignancy (NILM)    Review of Systems:  Pertinent items noted in HPI and remainder of comprehensive ROS otherwise negative.  Physical Exam:  BP 139/87   Pulse 67   Wt 211 lb 8 oz (95.9 kg)   BMI 31.23 kg/m  CONSTITUTIONAL: Well-developed, well-nourished female in no acute distress.  HEENT:  Normocephalic, atraumatic. External right and left ear normal. No scleral icterus.  NECK: Normal range of motion, supple, no masses noted on observation SKIN: No rash noted. Not diaphoretic. No erythema. No pallor. MUSCULOSKELETAL: Normal range of motion. No edema noted. NEUROLOGIC: Alert and oriented to person, place, and time. Normal muscle tone coordination. No cranial nerve deficit noted. PSYCHIATRIC: Normal mood and affect. Normal  behavior. Normal judgment and thought content.  CARDIOVASCULAR: Normal heart rate noted RESPIRATORY: Effort and breath sounds normal, no problems with respiration noted ABDOMEN: No masses noted. No other overt distention noted.    PELVIC: Normal appearing external genitalia; normal urethral meatus; normal appearing vaginal mucosa and cervix.  No abnormal discharge noted but 2 cm old, necrotic appearing tissue found in the vaginal vault sent for pathology.  Normal uterine size, no other palpable masses, no uterine or adnexal tenderness. Performed in the presence of a chaperone  Labs and Imaging Results for orders placed or performed in visit on 06/26/21 (from the past 168 hour(s))  Pregnancy, urine POC   Collection Time: 06/26/21  3:27 PM  Result Value Ref Range   Preg Test, Ur NEGATIVE NEGATIVE   MR Pelvis w/o contrast  Result Date: 06/24/2021 CLINICAL DATA:  post partum si joint pain chronic. EXAM: MRI PELVIS WITHOUT CONTRAST TECHNIQUE: Multiplanar multisequence MR imaging of the pelvis was performed. No intravenous contrast was administered. COMPARISON:  SI joint radiographs 11/23/2020 FINDINGS: Bones/Joint/Cartilage There is a well-circumscribed lesion within the S1 vertebrae demonstrating internal high T2 signal with periphery of fat signal. Margins appear somewhat angular. This measures 4.9 x 3.2 x 2.7 cm (axial T2 image 15, sagittal T2 image 34). The sacroiliac joints are unremarkable. The pubic symphysis is unremarkable. There is no evidence of hip fracture. No evidence of femoral head avascular necrosis. Ligaments Grossly intact Muscles and Tendons No muscle edema or atrophy.  The gluteal tendons are intact. The adductor muscles are unremarkable. The proximal hamstrings are unremarkable. Soft tissues No focal fluid collection. Diastasis recti. The internal pelvic structures are unremarkable. Dominant left ovarian follicle. 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 Consider follow-up MRI in 3-6 months to assess for interval change. No evidence of sacroiliitis. Electronically Signed   By: Maurine Simmering M.D.   On: 06/24/2021 13:04     GYNECOLOGY OFFICE PROCEDURE NOTE   Janice Stewart is a 35 y.o. [phone] here for endometrial biopsy for  history of choriocarcinoma in s/o AUB . Today, she reports no concerning symptoms.   ENDOMETRIAL BIOPSY     The indications for endometrial biopsy were reviewed.   Risks of the biopsy including cramping, bleeding, infection, uterine perforation, inadequate specimen and need for additional procedures were discussed. Offered alternative of hysteroscopy, dilation and curettage in OR. The patient states she understands the R/B/I/A and agrees to undergo procedure today. Urine pregnancy test was Negative. Consent was signed. Time out was performed.    Patient was positioned in dorsal lithotomy position. A vaginal speculum was placed.  The cervix was visualized and was prepped with Betadine.  ECC done first with curette. A single-toothed tenaculum was placed on the anterior lip of the cervix to stabilize it. The 3 mm pipelle was easily introduced into the endometrial cavity without difficulty to a depth of 8 cm, and a Moderate amount of tissue was obtained after two passes and sent to pathology. The instruments were removed from the patient's vagina. Minimal bleeding from the cervix was noted. The patient tolerated the procedure well.   Patient was given post procedure instructions.  Will follow up pathology and manage accordingly; patient will be contacted with results and recommendations.  Routine preventative health maintenance measures emphasized.  Assessment and Plan:    1. Breakthrough bleeding - While most likely normal due to being on POP/Depo or potentially due to BV, I still felt it would be safest to check ECC/EMB due to her history.  - I am  unsure about the tissue from the vaginal vault, potentially a remnant of decidual cast, but otherwise unclear given the amount of degeneration.  - We will send that tissue for pathology - I did also repeat a pap smear although her prior pap from June 2021 was normal.  - If all of her results are negative, she would like to switch back to Depo since she has had improvement in her BTB. She has a hard time remembering her pill every day.  - Cytology - PAP( [place]) - Surgical pathology( [place]/ [facility])  2. Choriocarcinoma of female [facility]) - EMB/ECC checked for this reason in s/o AUB   Routine preventative health maintenance measures emphasized. Please refer to After Visit Summary for other counseling recommendations.   Return if symptoms worsen or fail to improve.  Radene Gunning, MD, [place] for [facility], [place]

## 2021-06-26 NOTE — Progress Notes (Signed)
Requesting Depo instead of OCP. States she is having a hard time remembering to take OCP's.  Altha Harm, CMA

## 2021-06-28 LAB — CYTOLOGY - PAP
Chlamydia: NEGATIVE
Comment: NEGATIVE
Comment: NEGATIVE
Comment: NORMAL
Diagnosis: NEGATIVE
High risk HPV: NEGATIVE
Neisseria Gonorrhea: NEGATIVE

## 2021-06-28 LAB — SURGICAL PATHOLOGY

## 2021-06-29 ENCOUNTER — Ambulatory Visit: Payer: Self-pay

## 2021-06-29 ENCOUNTER — Other Ambulatory Visit: Payer: Self-pay

## 2021-06-29 ENCOUNTER — Ambulatory Visit (INDEPENDENT_AMBULATORY_CARE_PROVIDER_SITE_OTHER): Payer: No Typology Code available for payment source | Admitting: Physical Medicine and Rehabilitation

## 2021-06-29 ENCOUNTER — Encounter: Payer: Self-pay | Admitting: Physical Medicine and Rehabilitation

## 2021-06-29 DIAGNOSIS — M879 Osteonecrosis, unspecified: Secondary | ICD-10-CM

## 2021-06-29 DIAGNOSIS — D573 Sickle-cell trait: Secondary | ICD-10-CM

## 2021-06-29 DIAGNOSIS — M545 Low back pain, unspecified: Secondary | ICD-10-CM | POA: Diagnosis not present

## 2021-06-29 DIAGNOSIS — G8929 Other chronic pain: Secondary | ICD-10-CM

## 2021-06-29 DIAGNOSIS — M533 Sacrococcygeal disorders, not elsewhere classified: Secondary | ICD-10-CM

## 2021-06-29 NOTE — Progress Notes (Signed)
Pt state lower back pain that mostly on left side. Pt state sitting, laying and standing make the pain worse. Pt state she take pain meds to help ease her pain.  Numeric Pain Rating Scale and Functional Assessment Average Pain 9   In the last MONTH (on 0-10 scale) has pain interfered with the following?  1. General activity like being  able to carry out your everyday physical activities such as walking, climbing stairs, carrying groceries, or moving a chair?  Rating(10)   +Driver, -BT, -Dye Allergies.

## 2021-06-29 NOTE — Progress Notes (Signed)
Janice Stewart - 35 y.o. female MRN [id number]  Date of birth: Mar 23, 1986  Office Visit Note: Visit Date: 06/29/2021 PCP: Camillia Herter, NP Referred by: Camillia Herter, NP  Subjective: Chief Complaint  Patient presents with   Lower Back - Pain   HPI:  Janice Stewart is a 35 y.o. female who comes in today at the request of [place], PA-C for evaluation and management of chronic severe worsening left more than right low back pain.  She reports a 50-month history of presumed sacroiliac joint pain following delivery of her baby in February.  She denies any specific back pain history prior to this.  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  She did not have any problems with her first pregnancy with regards to sacroiliac pain.  She was initially seen by Dr. Eunice Blase in our office in April of this year with left more than right low back and buttock pain.  This is worse with sitting and twisting.  He did obtain x-rays of the sacrum and coccyx without any findings.  He did send her to physical therapy at that point.  While at physical therapy she did undergo dry needling and she said which helped her for short periods of time.  She has been doing home exercise and walking since then.  She states that the pain especially on the left did not get better and if everything its gotten worse.  She points to the pain located on the posterior lower back left greater than right.  She denies any referral pattern down the legs or paresthesias or numbness.  No focal weakness.  No bowel or bladder symptoms and no fever chills or night sweats.  MRI of the l pelvis was obtained and she is here today for review of this.  She has not seen the results of the MRI from the previous provider.  MRIs reviewed today with imaging presented to her as well as using a spine model.  The report is below.  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  Sacroiliac joints do appear normal as do the hips.  She has minimal lumbar findings.  Patient's history is also significant for sickle cell trait.  She reports to me today that she is really not followed for that that she was told that as a child and one of her children does carry the trait as well.  Review of Systems  Musculoskeletal:  Positive for back pain and joint pain.  All other systems reviewed and are negative. Otherwise per HPI.  Assessment & Plan: Visit Diagnoses:    ICD-10-CM   1. Sacroiliac joint pain  M53.3 Sacroiliac Joint Inj    XR C-ARM NO REPORT    2. Chronic left-sided low back pain without sciatica  M54.50    G89.29     3. 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 Sickle cell trait ([place])  D57.3 Ambulatory referral to Hematology / Oncology      Plan: Findings:  Chronic worsening severe left more than right low back and buttock pain worse with sitting and going from sit to stand which she rates as a 9 out of 10 pain and it does limit what she can do.  This started fairly abruptly after birth of her child  in February.  Has not improved despite medication and physical therapy and time.  MRI of the pelvis shows acute infarct in the S1 vertebral body.  Radiology recommended repeat imaging in 3 to 6 months.  She has some pain consistent with sacroiliac joint type pain and while there is no imaging findings of that that is pretty typical for SI joint pain.  I feel like her pain clearly has to be related to the infarct but could also be sacroiliac joint mediated pain Sharol Given the nature around the time of the birth.  Today we decided diagnostically to complete a left sacroiliac joint injection just to see how much relief she gets.  I have also put in a referral to hematology oncology to follow her for sickle cell trait and this bone infarct.  Depending on relief we may have to  refer her to a spine surgeon for further management.   Meds & Orders: No orders of the defined types were placed in this encounter.   Orders Placed This Encounter  Procedures   Sacroiliac Joint Inj   XR C-ARM NO REPORT   Ambulatory referral to Hematology / Oncology    Follow-up: Return in about 4 weeks (around 07/27/2021).   Procedures: Sacroiliac Joint Inj on 06/29/2021 9:37 AM Indications: pain and diagnostic evaluation Details: 22 G 3.5 in needle, fluoroscopy-guided posterior approach Medications: 2 mL bupivacaine 0.5 %; 80 mg methylPREDNISolone acetate 80 MG/ML Outcome: tolerated well, no immediate complications  Sacroiliac Joint Intra-Articular Injection - Posterior Approach with Fluoroscopic Guidance   Position: PRONE  Additional Comments: Vital signs were monitored before and after the procedure. Patient was prepped and draped in the usual sterile fashion. The correct patient, procedure, and site was verified.   Injection Procedure Details:   Location/Site:  Sacroiliac joint  Needle size: 3.5 in Spinal Needle  Needle type: Spinal  Needle Placement: Intra-articular  Findings:  -Comments: There was excellent flow of contrast producing a partial arthrogram of the sacroiliac joint.   Procedure Details: Starting with a 90 degree vertical and midline orientation the fluoroscope was tilted cranially 20 to 25 degrees and the target area of the inferior most part of the SI joint on the side mentioned above was visualized.  The soft tissues overlying this target were infiltrated with 4 ml. of 1% Lidocaine without Epinephrine. A #22 gauge spinal needle was inserted perpendicular to the fluoroscope table and advanced into the posterior inferior joint space using fluoroscopic guidance.  Position in the joint space was confirmed by obtaining a partial arthrogram using a 2 ml. volume of Isovue-250 contrast agent. After negative aspirate for gross pus or blood, the injectate was  delivered to the joint. Radiographs were obtained for documentation purposes.   Additional Comments:   Dressing: Bandaid    Post-procedure details: Patient was observed during the procedure. Post-procedure instructions were reviewed.  Patient left the clinic in stable condition.    There was excellent flow of contrast producing a partial arthrogram of the sacroiliac joint.  Procedure, treatment alternatives, risks and benefits explained, specific risks discussed. Consent was given by the patient. Immediately prior to procedure a time out was called to verify the correct patient, procedure, equipment, support staff and site/side marked as required. Patient was prepped and draped in the usual sterile fashion.         Clinical History: MRI PELVIS WITHOUT CONTRAST   TECHNIQUE: Multiplanar multisequence MR imaging of the pelvis was performed. No intravenous contrast was administered.   COMPARISON:  SI  joint radiographs 11/23/2020   FINDINGS: Bones/Joint/Cartilage   There is a well-circumscribed lesion within the S1 vertebrae demonstrating internal high T2 signal with periphery of fat signal. Margins appear somewhat angular. This measures 4.9 x 3.2 x 2.7 cm (axial T2 image 15, sagittal T2 image 34). The sacroiliac joints are unremarkable. The pubic symphysis is unremarkable. There is no evidence of hip fracture. No evidence of femoral head avascular necrosis.   Ligaments   Grossly intact   Muscles and Tendons No muscle edema or atrophy. The gluteal tendons are intact. The adductor muscles are unremarkable. The proximal hamstrings are unremarkable.   Soft tissues   No focal fluid collection. Diastasis recti. The internal pelvic structures are unremarkable. Dominant left ovarian follicle.   IMPRESSION: Well-circumscribed lesion within the S1 vertebrae measuring 4.9 x 3.2 x 2.7 cm, favored to represent an acute bone infarct. Consider follow-up MRI in 3-6 months to  assess for interval change.   No evidence of sacroiliitis.     Electronically Signed   By: Maurine Simmering M.D.   On: 06/24/2021 13:04     Objective:  VS:  HT:    WT:   BMI:     BP:   HR: bpm  TEMP: ( )  RESP:  Physical Exam Vitals and nursing note reviewed.  Constitutional:      General: She is not in acute distress.    Appearance: Normal appearance. She is not ill-appearing.  HENT:     Head: Normocephalic and atraumatic.     Right Ear: External ear normal.     Left Ear: External ear normal.  Eyes:     Extraocular Movements: Extraocular movements intact.  Cardiovascular:     Rate and Rhythm: Normal rate.     Pulses: Normal pulses.  Pulmonary:     Effort: Pulmonary effort is normal. No respiratory distress.  Abdominal:     General: There is no distension.     Palpations: Abdomen is soft.  Musculoskeletal:        General: Tenderness present. No deformity.     Cervical back: Neck supple.     Right lower leg: No edema.     Left lower leg: No edema.     Comments: Patient has good distal strength with no pain over the greater trochanters.  No clonus or focal weakness.  She has some pain with extension of the lumbar spine.  She does have a positive Fortin finger sign over the left.  She has equivocally positive Patrick's testing left more than right.  She has no pain with hip rotation internal rotation.  Skin:    Findings: No erythema, lesion or rash.  Neurological:     General: No focal deficit present.     Mental Status: She is alert and oriented to person, place, and time.     Sensory: No sensory deficit.     Motor: No weakness or abnormal muscle tone.     Coordination: Coordination normal.     Gait: Gait normal.  Psychiatric:        Mood and Affect: Mood normal.        Behavior: Behavior normal.     Imaging: No results found.

## 2021-07-04 ENCOUNTER — Encounter: Payer: Self-pay | Admitting: Obstetrics and Gynecology

## 2021-07-04 ENCOUNTER — Encounter: Payer: Self-pay | Admitting: Physical Medicine and Rehabilitation

## 2021-07-04 MED ORDER — BUPIVACAINE HCL 0.5 % IJ SOLN
2.0000 mL | INTRAMUSCULAR | Status: AC | PRN
  Administered 2021-06-29: 2 mL via INTRA_ARTICULAR

## 2021-07-04 MED ORDER — METHYLPREDNISOLONE ACETATE 80 MG/ML IJ SUSP
80.0000 mg | INTRAMUSCULAR | Status: AC | PRN
  Administered 2021-06-29: 80 mg via INTRA_ARTICULAR

## 2021-07-04 NOTE — Progress Notes (Signed)
Referral was sent to sickle cell clinic.

## 2021-07-08 NOTE — Progress Notes (Signed)
Patient ID: Janice Stewart, female    DOB: 08/17/1985  MRN: [id number]  CC: Hypertension Follow-Up   Subjective: Janice Stewart is a 35 y.o. female who presents for hypertension follow-up.   Her concerns today include:   HYPERTENSION FOLLOW-UP: 02/07/2021: - Patient still on garlic regimen and prefers to continue. She is slicing 1 clove and placing in water to drink. Home blood pressures are remaining normal. - Counseled to continue to monitor blood pressures at home and if blood pressures are remaining above goal of 130's/80's follow-up with me for an appointment. Patient verbalized understanding.  07/13/2021: Reports had high blood pressures at recent OB/GYN appointment. Blood pressure was only taken once. She is not currently checking blood pressures at home. No longer doing garlic regimen.  2. NECK STIFF: Began 5 days ago while blow drying her hair. Denies any recent trauma/injury. Located at diffuse neck, rotating sides depending on the day. Taking Ibuprofen does not help. She is not breastfeeding.  3. WEIGHT CONCERN: Requesting referral to Nutrition. Reports since giving birth 1 year ago gaining weight even with monitoring food intake and doing exercise. Thinks may be related to metabolism.  4. FATIGUE: History of sickle cell trait. Referred to Hematology by Orthopedics.    Depression screen Janice Stewart 2/9 07/13/2021 06/26/2021 06/14/2021 04/19/2021 02/07/2021  Decreased Interest 0 0 0 0 0  Down, Depressed, Hopeless 0 0 0 0 0  PHQ - 2 Score 0 0 0 0 0  Altered sleeping - 0 0 0 0  Tired, decreased energy - 0 2 0 0  Change in appetite - 0 2 0 0  Feeling bad or failure about yourself  - 0 0 0 0  Trouble concentrating - 0 0 0 0  Moving slowly or fidgety/restless - 0 0 0 0  Suicidal thoughts - 0 0 0 0  PHQ-9 Score - 0 4 0 0  Difficult doing work/chores - - - Not difficult at all Not difficult at all  Some recent data might be hidden     Patient Active Problem List    Diagnosis Date Noted   Post-dates pregnancy 07/17/2020   Sickle cell trait ([place]) 02/22/2020   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     No current facility-administered medications on file prior to visit.    No Known Allergies  Social History   Socioeconomic History   Marital status: Single    Spouse name: Not on file   Number of children: Not on file   Years of education: Not on file   Highest education level: Not on file  Occupational History   Not on file  Tobacco Use   Smoking status: Never   Smokeless tobacco: Never  Vaping Use   Vaping Use: Never used  Substance and Sexual Activity   Alcohol use: Not Currently   Drug use: Never   Sexual activity: Yes    Birth control/protection: Pill    Comment: DEPO-PROVERA  Other Topics Concern   Not on file  Social History Narrative   Not on file   Social Determinants of Health   Financial Resource Strain: Not on file  Food Insecurity: No Food Insecurity   Worried About Charity fundraiser in  the Last Year: Never true   Ran Out of Food in the Last Year: Never true  Transportation Needs: No Transportation Needs   Lack of Transportation (Medical): No   Lack of Transportation (Non-Medical): No  Physical Activity: Not on file  Stress: Not on file  Social Connections: Not on file  Intimate Partner Violence: Not on file    Family History  Problem Relation Age of Onset   Varicose Veins Mother    Hypertension Father    Cancer Father        prostate    Past Surgical History:  Procedure Laterality Date   LIVER BIOPSY  2017   LUNG BIOPSY  2017    ROS: Review of Systems Negative except as stated above  PHYSICAL EXAM: BP 138/87 (BP Location:  Left Arm, Patient Position: Sitting, Cuff Size: Large)   Pulse 77   Temp 98 F (36.7 C)   Resp 18   Ht 5' 9.02" (1.753 m)   Wt 210 lb 9.6 oz (95.5 kg)   SpO2 98%   Breastfeeding No   BMI 31.09 kg/m    Physical Exam HENT:     Head: Normocephalic and atraumatic.  Eyes:     Extraocular Movements: Extraocular movements intact.     Conjunctiva/sclera: Conjunctivae normal.     Pupils: Pupils are equal, round, and reactive to light.  Cardiovascular:     Rate and Rhythm: Normal rate and regular rhythm.     Pulses: Normal pulses.     Heart sounds: Normal heart sounds.  Pulmonary:     Effort: Pulmonary effort is normal.     Breath sounds: Normal breath sounds.  Musculoskeletal:     Cervical back: Normal range of motion and neck supple.  Neurological:     General: No focal deficit present.     Mental Status: She is alert and oriented to person, place, and time.  Psychiatric:        Mood and Affect: Mood normal.        Behavior: Behavior normal.    ASSESSMENT AND PLAN: 1. Essential hypertension: - Blood pressure at goal during today's office visit.  - Counseled to monitor blood pressures at home.  - Follow-up with primary provider in 2 weeks or sooner if needed. Write down your blood pressure readings each day and bring those results along with your home blood pressure monitor to your appointment.   2. Neck stiffness: - Cyclobenzaprine as prescribed.  - Follow-up with primary provider in 4 weeks or sooner if needed.  - cyclobenzaprine (FLEXERIL) 5 MG tablet; Take 1 tablet (5 mg total) by mouth at bedtime for 14 days.  Dispense: 14 tablet; Refill: 0  3. Encounter for weight management: - Per patient request referral to Medical Nutrition Therapy for further evaluation and management. - Amb ref to Medical Nutrition Therapy-MNT  4. Other fatigue: 5. Sickle cell trait [facility]): - Keep all scheduled appointments with Hematology.   Patient was given the opportunity to ask  questions.  Patient verbalized understanding of the plan and was able to repeat key elements of the plan. Patient was given clear instructions to go to Emergency Department or return to medical center if symptoms don't improve, worsen, or new problems develop.The patient verbalized understanding.   Orders Placed This Encounter  Procedures   Amb ref to Medical Nutrition Therapy-MNT     Requested Prescriptions   Signed Prescriptions Disp Refills   cyclobenzaprine (FLEXERIL) 5 MG tablet 14 tablet 0    Sig:  Take 1 tablet (5 mg total) by mouth at bedtime for 14 days.    Return in about 2 weeks (around 07/27/2021) for Follow-Up or next available hypertension .  Camillia Herter, NP

## 2021-07-10 ENCOUNTER — Encounter: Payer: Self-pay | Admitting: Physical Medicine and Rehabilitation

## 2021-07-10 DIAGNOSIS — D573 Sickle-cell trait: Secondary | ICD-10-CM

## 2021-07-10 DIAGNOSIS — M879 Osteonecrosis, unspecified: Secondary | ICD-10-CM

## 2021-07-11 ENCOUNTER — Other Ambulatory Visit: Payer: Self-pay

## 2021-07-11 ENCOUNTER — Ambulatory Visit (INDEPENDENT_AMBULATORY_CARE_PROVIDER_SITE_OTHER): Payer: No Typology Code available for payment source

## 2021-07-11 VITALS — BP 144/87 | HR 89 | Wt 213.8 lb

## 2021-07-11 DIAGNOSIS — Z3202 Encounter for pregnancy test, result negative: Secondary | ICD-10-CM

## 2021-07-11 DIAGNOSIS — Z3042 Encounter for surveillance of injectable contraceptive: Secondary | ICD-10-CM | POA: Diagnosis not present

## 2021-07-11 LAB — POCT PREGNANCY, URINE: Preg Test, Ur: NEGATIVE

## 2021-07-11 MED ORDER — MEDROXYPROGESTERONE ACETATE 150 MG/ML IM SUSP
150.0000 mg | Freq: Once | INTRAMUSCULAR | Status: AC
  Administered 2021-07-11: 150 mg via INTRAMUSCULAR

## 2021-07-11 NOTE — Progress Notes (Signed)
Arnett here for Depo-Provera Injection. Injection administered without complication. Patient will return in 3 months for next injection between Feb 14 and Feb 28. Next annual visit due 07/2022.  Pt last injection of Depo Provera was due by November 7th.  Pt reports that she was prescribed BCP's, confirmed in chart, and takes them on time.  UPT negative.   Pt states that she has had unprotected sex and is taking her BCP every day at the same time.    Verdell Carmine, RN 07/11/2021  1:31 PM

## 2021-07-12 NOTE — Progress Notes (Signed)
Chart reviewed for nurse visit. Agree with plan of care.   Clarnce Flock, MD 07/12/21 12:53 PM

## 2021-07-13 ENCOUNTER — Other Ambulatory Visit: Payer: Self-pay

## 2021-07-13 ENCOUNTER — Encounter: Payer: Self-pay | Admitting: Family

## 2021-07-13 ENCOUNTER — Ambulatory Visit (INDEPENDENT_AMBULATORY_CARE_PROVIDER_SITE_OTHER): Payer: No Typology Code available for payment source | Admitting: Family

## 2021-07-13 VITALS — BP 138/87 | HR 77 | Temp 98.0°F | Resp 18 | Ht 69.02 in | Wt 210.6 lb

## 2021-07-13 DIAGNOSIS — M436 Torticollis: Secondary | ICD-10-CM | POA: Diagnosis not present

## 2021-07-13 DIAGNOSIS — R5383 Other fatigue: Secondary | ICD-10-CM

## 2021-07-13 DIAGNOSIS — Z7689 Persons encountering health services in other specified circumstances: Secondary | ICD-10-CM

## 2021-07-13 DIAGNOSIS — D573 Sickle-cell trait: Secondary | ICD-10-CM | POA: Diagnosis not present

## 2021-07-13 DIAGNOSIS — I10 Essential (primary) hypertension: Secondary | ICD-10-CM | POA: Diagnosis not present

## 2021-07-13 MED ORDER — CYCLOBENZAPRINE HCL 5 MG PO TABS: 5.0000 mg | ORAL_TABLET | Freq: Every day | ORAL | 0 refills | Status: AC

## 2021-07-13 NOTE — Patient Instructions (Signed)
Mediterranean Diet °A Mediterranean diet refers to food and lifestyle choices that are based on the traditions of countries located on the [place] Sea. It focuses on eating more fruits, vegetables, whole grains, beans, nuts, seeds, and heart-healthy fats, and eating less dairy, meat, eggs, and processed foods with added sugar, salt, and fat. This way of eating has been shown to help prevent certain conditions and improve outcomes for people who have chronic diseases, like kidney disease and heart disease. °What are tips for following this plan? °Reading food labels °Check the serving size of packaged foods. For foods such as rice and pasta, the serving size refers to the amount of cooked product, not dry. °Check the total fat in packaged foods. Avoid foods that have saturated fat or trans fats. °Check the ingredient list for added sugars, such as corn syrup. °Shopping ° °Buy a variety of foods that offer a balanced diet, including: °Fresh fruits and vegetables (produce). °Grains, beans, nuts, and seeds. Some of these may be available in unpackaged forms or large amounts (in bulk). °Fresh seafood. °Poultry and eggs. °Low-fat dairy products. °Buy whole ingredients instead of prepackaged foods. °Buy fresh fruits and vegetables in-season from local farmers markets. °Buy plain frozen fruits and vegetables. °If you do not have access to quality fresh seafood, buy precooked frozen shrimp or canned fish, such as tuna, salmon, or sardines. °Stock your pantry so you always have certain foods on hand, such as olive oil, canned tuna, canned tomatoes, rice, pasta, and beans. °Cooking °Cook foods with extra-virgin olive oil instead of using butter or other vegetable oils. °Have meat as a side dish, and have vegetables or grains as your main dish. This means having meat in small portions or adding small amounts of meat to foods like pasta or stew. °Use beans or vegetables instead of meat in common dishes like chili or  lasagna. °Experiment with different cooking methods. Try roasting, broiling, steaming, and sautéing vegetables. °Add frozen vegetables to soups, stews, pasta, or rice. °Add nuts or seeds for added healthy fats and plant protein at each meal. You can add these to yogurt, salads, or vegetable dishes. °Marinate fish or vegetables using olive oil, lemon juice, garlic, and fresh herbs. °Meal planning °Plan to eat one vegetarian meal one day each week. Try to work up to two vegetarian meals, if possible. °Eat seafood two or more times a week. °Have healthy snacks readily available, such as: °Vegetable sticks with hummus. °Greek yogurt. °Fruit and nut trail mix. °Eat balanced meals throughout the week. This includes: °Fruit: 2-3 servings a day. °Vegetables: 4-5 servings a day. °Low-fat dairy: 2 servings a day. °Fish, poultry, or lean meat: 1 serving a day. °Beans and legumes: 2 or more servings a week. °Nuts and seeds: 1-2 servings a day. °Whole grains: 6-8 servings a day. °Extra-virgin olive oil: 3-4 servings a day. °Limit red meat and sweets to only a few servings a month. °Lifestyle ° °Cook and eat meals together with your family, when possible. °Drink enough fluid to keep your urine pale yellow. °Be physically active every day. This includes: °Aerobic exercise like running or swimming. °Leisure activities like gardening, walking, or housework. °Get 7-8 hours of sleep each night. °If recommended by your health care provider, drink red wine in moderation. This means 1 glass a day for nonpregnant women and 2 glasses a day for men. A glass of wine equals 5 oz (150 mL). °What foods should I eat? °Fruits °Apples. Apricots. Avocado. Berries. Bananas. Cherries. Dates.   Figs. Grapes. Lemons. Melon. Oranges. Peaches. Plums. Pomegranate. °Vegetables °Artichokes. Beets. Broccoli. Cabbage. Carrots. Eggplant. Green beans. Chard. Kale. Spinach. Onions. Leeks. Peas. Squash. Tomatoes. Peppers. Radishes. °Grains °Whole-grain pasta. Brown  rice. Bulgur wheat. Polenta. Couscous. Whole-wheat bread. Oatmeal. Quinoa. °Meats and other proteins °Beans. Almonds. Sunflower seeds. Pine nuts. Peanuts. Cod. Salmon. Scallops. Shrimp. Tuna. Tilapia. Clams. Oysters. Eggs. Poultry without skin. °Dairy °Low-fat milk. Cheese. Greek yogurt. °Fats and oils °Extra-virgin olive oil. Avocado oil. Grapeseed oil. °Beverages °Water. Red wine. Herbal tea. °Sweets and desserts °Greek yogurt with honey. Baked apples. Poached pears. Trail mix. °Seasonings and condiments °Basil. Cilantro. Coriander. Cumin. Mint. Parsley. Sage. Rosemary. Tarragon. Garlic. Oregano. Thyme. Pepper. Balsamic vinegar. Tahini. Hummus. Tomato sauce. Olives. Mushrooms. °The items listed above may not be a complete list of foods and beverages you can eat. Contact a dietitian for more information. °What foods should I limit? °This is a list of foods that should be eaten rarely or only on special occasions. °Fruits °Fruit canned in syrup. °Vegetables °Deep-fried potatoes (french fries). °Grains °Prepackaged pasta or rice dishes. Prepackaged cereal with added sugar. Prepackaged snacks with added sugar. °Meats and other proteins °Beef. Pork. Lamb. Poultry with skin. Hot dogs. Bacon. °Dairy °Ice cream. Sour cream. Whole milk. °Fats and oils °Butter. Canola oil. Vegetable oil. Beef fat (tallow). Lard. °Beverages °Juice. Sugar-sweetened soft drinks. Beer. Liquor and spirits. °Sweets and desserts °Cookies. Cakes. Pies. Candy. °Seasonings and condiments °Mayonnaise. Pre-made sauces and marinades. °The items listed above may not be a complete list of foods and beverages you should limit. Contact a dietitian for more information. °Summary °The Mediterranean diet includes both food and lifestyle choices. °Eat a variety of fresh fruits and vegetables, beans, nuts, seeds, and whole grains. °Limit the amount of red meat and sweets that you eat. °If recommended by your health care provider, drink red wine in moderation.  This means 1 glass a day for nonpregnant women and 2 glasses a day for men. A glass of wine equals 5 oz (150 mL). °This information is not intended to replace advice given to you by your health care provider. Make sure you discuss any questions you have with your health care provider. °Document Revised: 09/04/2019 Document Reviewed: 07/02/2019 °Elsevier Patient Education © 2022 [employer] Inc. ° °

## 2021-07-13 NOTE — Progress Notes (Signed)
Pt presents for hypertension follow-up, states that she would like to start back on BP med, pt has complaints of stiff neck that started on Saturday has constant pain, has symptoms of fatigue and sleepiness, denies any fever or nausea

## 2021-07-28 ENCOUNTER — Other Ambulatory Visit: Payer: Self-pay | Admitting: Family

## 2021-07-28 ENCOUNTER — Encounter: Payer: Self-pay | Admitting: Family

## 2021-07-28 DIAGNOSIS — Z7689 Persons encountering health services in other specified circumstances: Secondary | ICD-10-CM

## 2021-07-28 DIAGNOSIS — E669 Obesity, unspecified: Secondary | ICD-10-CM

## 2021-07-28 DIAGNOSIS — R635 Abnormal weight gain: Secondary | ICD-10-CM

## 2021-07-29 ENCOUNTER — Encounter: Payer: Self-pay | Admitting: Family

## 2021-08-18 ENCOUNTER — Ambulatory Visit: Payer: No Typology Code available for payment source | Admitting: Nurse Practitioner

## 2021-09-05 ENCOUNTER — Encounter: Payer: Self-pay | Admitting: Nurse Practitioner

## 2021-09-05 ENCOUNTER — Ambulatory Visit (INDEPENDENT_AMBULATORY_CARE_PROVIDER_SITE_OTHER): Payer: No Typology Code available for payment source | Admitting: Nurse Practitioner

## 2021-09-05 ENCOUNTER — Other Ambulatory Visit: Payer: Self-pay

## 2021-09-05 VITALS — BP 133/70 | HR 87 | Temp 98.2°F | Ht 69.0 in | Wt 207.8 lb

## 2021-09-05 DIAGNOSIS — Z130 Encounter for screening for diseases of the blood and blood-forming organs and certain disorders involving the immune mechanism: Secondary | ICD-10-CM

## 2021-09-05 DIAGNOSIS — G8929 Other chronic pain: Secondary | ICD-10-CM

## 2021-09-05 DIAGNOSIS — M545 Low back pain, unspecified: Secondary | ICD-10-CM

## 2021-09-05 DIAGNOSIS — Z0000 Encounter for general adult medical examination without abnormal findings: Secondary | ICD-10-CM | POA: Diagnosis not present

## 2021-09-05 LAB — POCT URINALYSIS DIP (CLINITEK)
Bilirubin, UA: NEGATIVE
Glucose, UA: NEGATIVE mg/dL
Ketones, POC UA: NEGATIVE mg/dL
Leukocytes, UA: NEGATIVE
Nitrite, UA: NEGATIVE
POC PROTEIN,UA: NEGATIVE
Spec Grav, UA: 1.025 (ref 1.010–1.025)
Urobilinogen, UA: 0.2 E.U./dL
pH, UA: 5 (ref 5.0–8.0)

## 2021-09-05 NOTE — Progress Notes (Signed)
[place] [place], [place]  [address] Phone:  [phone]   Fax:  [phone] Subjective:   Patient ID: Janice Stewart, female    DOB: [birth year], 36 y.o.   MRN: [id number]  Chief Complaint  Patient presents with   Establish Care    Pt is here today to establish care. Pt states that she is wanting to see is she has sickle cell diease or the sickle cell trait. Pt states that she was told that she was a sickle cell carrier but would like to get tested further.    HPI [place] 36 y.o. female  has a past medical history of Cancer [facility]), Hypertension, and Stroke ([place]). To the [facility] for screening for sickle cell trait.  Patient states that she was referred to the clinic by orthopedics after finding bone infarct on MRI. States that MRI was completed due to progressive low back pain after having son 1 yr ago. States that she also has history of cancer after giving birth to first child, due to retention of placenta.  Denies any other complaints today. Visits with PCP regularly.   Regularly seeing orthopedics for low back pain, given steroid injection with no improvement in symptoms. Also attempting core exercises at home for pain relief, hoping to avoid additional medications.   Denies any fatigue, chest pain, shortness of breath, HA or dizziness. Denies any blurred vision, numbness or tingling. Past Medical History:  Diagnosis Date   Cancer ([place])    Hypertension    Stroke [facility])     Past Surgical History:  Procedure Laterality Date   LIVER BIOPSY  2017   LUNG BIOPSY  2017    Family History  Problem Relation Age of Onset   Varicose Veins Mother    Hypertension Father    Cancer Father        prostate    Social History   Socioeconomic History   Marital status: Single    Spouse name: Not on file   Number of children: Not on file   Years of education: Not on file   Highest education level: Not on file  Occupational  History   Not on file  Tobacco Use   Smoking status: Never   Smokeless tobacco: Never  Vaping Use   Vaping Use: Never used  Substance and Sexual Activity   Alcohol use: Not Currently   Drug use: Never   Sexual activity: Yes    Birth control/protection: Injection    Comment: DEPO-PROVERA  Other Topics Concern   Not on file  Social History Narrative   Not on file   Social Determinants of Health   Financial Resource Strain: Not on file  Food Insecurity: No Food Insecurity   Worried About Running Out of Food in the Last Year: Never true   Ran Out of Food in the Last Year: Never true  Transportation Needs: No Transportation Needs   Lack of Transportation (Medical): No   Lack of Transportation (Non-Medical): No  Physical Activity: Not on file  Stress: Not on file  Social Connections: Not on file  Intimate Partner Violence: Not on file    Outpatient Medications Prior to Visit  Medication Sig Dispense Refill   medroxyPROGESTERone (DEPO-PROVERA) 150 MG/ML injection Inject 150 mg into the muscle every 3 (three) months.     No facility-administered medications prior to visit.    No Known Allergies  Review of Systems  Constitutional:  Negative for chills, fever and malaise/fatigue.  Respiratory:  Negative for cough and shortness of breath.   Cardiovascular:  Negative for chest pain, palpitations and leg swelling.  Gastrointestinal:  Negative for abdominal pain, blood in stool, constipation, diarrhea, nausea and vomiting.  Musculoskeletal:  Positive for back pain.  Skin: Negative.   Neurological: Negative.   Psychiatric/Behavioral:  Negative for depression. The patient is not nervous/anxious.   All other systems reviewed and are negative.     Objective:    Physical Exam Vitals reviewed.  Constitutional:      General: She is not in acute distress.    Appearance: Normal appearance.  HENT:     Head: Normocephalic.  Cardiovascular:     Rate and Rhythm: Normal rate and  regular rhythm.     Pulses: Normal pulses.     Heart sounds: Normal heart sounds.     Comments: No obvious peripheral edema Pulmonary:     Effort: Pulmonary effort is normal.     Breath sounds: Normal breath sounds.  Musculoskeletal:        General: Normal range of motion.  Skin:    General: Skin is warm and dry.     Capillary Refill: Capillary refill takes less than 2 seconds.  Neurological:     General: No focal deficit present.     Mental Status: She is alert and oriented to person, place, and time.  Psychiatric:        Mood and Affect: Mood normal.        Behavior: Behavior normal.        Thought Content: Thought content normal.        Judgment: Judgment normal.    BP 133/70    Pulse 87    Temp 98.2 F (36.8 C)    Ht 5' 9" (1.753 m)    Wt 207 lb 12.8 oz (94.3 kg)    LMP  (LMP Unknown)    SpO2 100%    Breastfeeding No    BMI 30.69 kg/m  Wt Readings from Last 3 Encounters:  09/05/21 207 lb 12.8 oz (94.3 kg)  07/13/21 210 lb 9.6 oz (95.5 kg)  07/11/21 213 lb 12.8 oz (97 kg)    Immunization History  Administered Date(s) Administered   Influenza,inj,Quad PF,6+ Mos 04/21/2020   Janssen (J&J) SARS-COV-2 Vaccination 08/02/2020   Tdap 04/21/2020    Diabetic Foot Exam - Simple   No data filed     Lab Results  Component Value Date   TSH 1.390 12/19/2020   Lab Results  Component Value Date   WBC 9.0 12/19/2020   HGB 11.2 12/19/2020   HCT 33.4 (L) 12/19/2020   MCV 84 12/19/2020   PLT 287 12/19/2020   Lab Results  Component Value Date   NA 141 12/19/2020   K 4.4 12/19/2020   CO2 19 (L) 12/19/2020   GLUCOSE 71 12/19/2020   BUN 9 12/19/2020   CREATININE 0.86 12/19/2020   BILITOT 0.3 12/19/2020   ALKPHOS 70 12/19/2020   AST 18 12/19/2020   ALT 11 12/19/2020   PROT 7.6 12/19/2020   ALBUMIN 4.7 12/19/2020   CALCIUM 9.4 12/19/2020   ANIONGAP 9 07/23/2020   EGFR 90 12/19/2020   Lab Results  Component Value Date   CHOL 206 (H) 12/19/2020   Lab Results   Component Value Date   HDL 59 12/19/2020   Lab Results  Component Value Date   LDLCALC 134 (H) 12/19/2020  Lab Results  Component Value Date   TRIG 71 12/19/2020   Lab Results  Component Value Date   CHOLHDL 3.5 12/19/2020   Lab Results  Component Value Date   HGBA1C 5.2 12/19/2020       Assessment & Plan:   Problem List Items Addressed This Visit   None Visit Diagnoses     Healthcare maintenance    -  Primary   Relevant Orders   POCT URINALYSIS DIP (CLINITEK) (Completed)   Encounter for sickle-cell screening       Relevant Orders   Hgb Fractionation [place]   Chronic low back pain, unspecified back pain laterality, unspecified whether sciatica present     Continue follow up with orthopedics  Discussed non pharmacological methods for pain management    Maintain upcoming follow up with PCP    I am having [place] maintain her medroxyPROGESTERone.  No orders of the defined types were placed in this encounter.    Teena Dunk, NP

## 2021-09-05 NOTE — Patient Instructions (Signed)
You were seen today in the [facility] for labs. Labs were collected, results will be available via MyChart or, if abnormal, you will be contacted by clinic staff. Y Please follow up as needed.

## 2021-09-08 LAB — HGB FRACTIONATION CASCADE
Hgb A2: 3.3 % — ABNORMAL HIGH (ref 1.8–3.2)
Hgb A: 54.5 % — ABNORMAL LOW (ref 96.4–98.8)
Hgb F: 0.6 % (ref 0.0–2.0)
Hgb S: 41.6 % — ABNORMAL HIGH

## 2021-09-08 LAB — HGB SOLUBILITY: Hgb Solubility: POSITIVE — AB

## 2021-09-22 ENCOUNTER — Encounter: Payer: No Typology Code available for payment source | Attending: Family | Admitting: Registered"

## 2021-09-22 ENCOUNTER — Other Ambulatory Visit: Payer: Self-pay

## 2021-09-22 ENCOUNTER — Encounter: Payer: Self-pay | Admitting: Registered"

## 2021-09-22 VITALS — Ht 69.0 in | Wt 205.1 lb

## 2021-09-22 DIAGNOSIS — E669 Obesity, unspecified: Secondary | ICD-10-CM | POA: Insufficient documentation

## 2021-09-22 NOTE — Progress Notes (Signed)
Medical Nutrition Therapy  Appointment Start time:  [phone]  Appointment End time:  1015  Primary concerns today: weight management and healthy diet specific to her needs and medical history of CVA, cancer and sickle cell trail Referral diagnosis: obesity Preferred learning style: no preference indicated Learning readiness: ready, change in progress  NUTRITION ASSESSMENT   Anthropometrics  Wt Readings from Last 3 Encounters:  09/22/21 205 lb 1.6 oz (93 kg)  09/05/21 207 lb 12.8 oz (94.3 kg)  07/13/21 210 lb 9.6 oz (95.5 kg)   Body Composition Scale Date 09/22/21  Current Body Weight 205.1 lb  Total Body Fat % 36.3%  Visceral Fat 8 (healthy level)  Fat-Free Mass % 63.6%   Total Body Water % 46.3%  Muscle-Mass lbs 35.1 lb  BMI 30  Body Fat Displacement          Torso  lbs 46.1         Left Leg  lbs 9.2         Right Leg  lbs 9.2         Left Arm  lbs 4.6         Right Arm   lbs 4.6      Clinical Medical Hx: CVA and gestational choriocarcinoma (cancer) in 2017, developed pre-eclampsia during delivery and HTN continued postpartum, sickle cell trait Medications: OCP Labs: A1c 5.2%; LDL 134 Notable Signs/Symptoms: thinning hair per chart, but was not obvious and did not discuss this visit with pt  Lifestyle & Dietary Hx Pt states since last pregnancy her weight continues to increase. Pt reports for past month stopped sugar, carbs, and deep fried foods, switched from whole milk to almond milk, and stopped drinking coca-cola. Pt reports her weight has not changed but her stomach is less bloated.  Pt states she does not take supplement pill, instead makes a mixture of things and takes 1 shot per day: Ginger, turmeric, orange, lemon, black pepper, cayenne pepper.   Pt states she would like to know how to eat healthy given her history of . Pt states Gave birth 1 year ago, has been gaining weight. Notice metabolism went down. Does not have a structure anxiety started eating more carbs,  tried to stop eating.  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 Pt states has not been eating these foods for the last month, was hard at first but now is not a problem  Pt states she misses her traditional foods such as rice/beans fried eggs, pasta, pork.  Estimated daily fluid intake: 128 oz Supplements: none (makes her own concoction see below) Sleep: 10p -6:30 a.m. Not a morning person, gets up for children. Stress / self-care: not assessed Current average weekly physical activity: core rehab from last pregnancy.  24-Hr Dietary Recall First Meal: coffee sugar-free flavored syrup with milk/tsp brown sugar, 1/2 cup oatmeal, 1 Tbs maple syrup, almond milk, flaxseed.  Snack:  Second Meal/snack time varies: smoothie with fruit and maple syrup OR avocado and whole wheat 1 slice toast, boiled eggs or spinach omelette  Snack: none Third Meal: 6 pm vegetables (greens, carrots, peas, onion, peppers, asparagus), fish or poultry Snack: pistachios Beverages: water, coffee, orange or cranberry juice  NUTRITION DIAGNOSIS  NB-1.1 Food and nutrition-related knowledge deficit As related to healthy fats and added sugar effect on body.  As evidenced by patient states intake changes including using coconut oil instead of olive oil and maple syrup based on effort to eat healthier.  NUTRITION INTERVENTION  Nutrition education (E-1) on the following topics:  MyPlate Insulin and glucose role in energy Insulin role in inflammation & fat deposit Type of fat (sat & unsaturated, SCFA, MCT, LCFA)  Handouts Provided Include  Using Plate Method - [employer]  Learning Style & Readiness for Change Teaching method utilized: Environmental health practitioner & Auditory  Demonstrated degree of understanding via: Teach Back  Barriers to learning/adherence to lifestyle change: none  Goals Established by [employer] job cutting out the soda and deserts. You can try adding back in some pasta and rice and beans as  part of a balanced meal. Use handout for ideas.  Breakfast: eliminate sugar and maple sugar. Use fruit such as 1/2 banana to sweeten your oatmeal.  Lunch: you can still do you small meals, but try to get in at the same time each day.  Next visit we will talk more a bout creating specific meals if needed and how exercise plays into this.   MONITORING & EVALUATION Dietary intake, weekly physical activity, and body comp weight in 6 weeks.

## 2021-09-22 NOTE — Patient Instructions (Addendum)
Great job cutting out the soda and deserts. You can try adding back in some pasta and rice and beans as part of a balanced meal. Use handout for ideas.  Breakfast: eliminate sugar and maple sugar. Use fruit such as 1/2 banana to sweeten your oatmeal.  Lunch: you can still do you small meals, but try to get in at the same time each day.  Next visit we will talk more a bout creating specific meals if needed and how exercise plays into this.

## 2021-09-27 ENCOUNTER — Other Ambulatory Visit: Payer: Self-pay

## 2021-09-27 ENCOUNTER — Ambulatory Visit (INDEPENDENT_AMBULATORY_CARE_PROVIDER_SITE_OTHER): Payer: No Typology Code available for payment source

## 2021-09-27 VITALS — BP 136/82 | HR 70 | Wt 205.8 lb

## 2021-09-27 DIAGNOSIS — Z3042 Encounter for surveillance of injectable contraceptive: Secondary | ICD-10-CM | POA: Diagnosis not present

## 2021-09-27 MED ORDER — MEDROXYPROGESTERONE ACETATE 150 MG/ML IM SUSP
150.0000 mg | Freq: Once | INTRAMUSCULAR | Status: AC
  Administered 2021-09-27: 150 mg via INTRAMUSCULAR

## 2021-09-27 NOTE — Progress Notes (Signed)
[place] here for Depo-Provera Injection. Injection administered without complication. Patient will return in 3 months for next injection between May 3 and May 17. Next annual visit due December 2023.   Seth Bake, RN 09/27/2021

## 2021-11-03 ENCOUNTER — Encounter: Payer: Self-pay | Admitting: Registered"

## 2021-11-03 ENCOUNTER — Other Ambulatory Visit: Payer: Self-pay

## 2021-11-03 ENCOUNTER — Encounter: Payer: No Typology Code available for payment source | Attending: Family | Admitting: Registered"

## 2021-11-03 DIAGNOSIS — E669 Obesity, unspecified: Secondary | ICD-10-CM | POA: Insufficient documentation

## 2021-11-03 NOTE — Progress Notes (Signed)
Follow-up Medical Nutrition Therapy  ?Appointment Start time:  1655  Appointment End time:  1200 ? ?Primary concerns today: weight management and healthy diet specific to her needs and medical history of CVA, cancer and sickle cell trail ?Referral diagnosis: obesity ?Preferred learning style: no preference indicated ?Learning readiness: ready, change in progress ? ?NUTRITION ASSESSMENT  ? ?Anthropometrics  ?Wt Readings from Last 3 Encounters:  ?09/27/21 205 lb 12.8 oz (93.4 kg)  ?09/22/21 205 lb 1.6 oz (93 kg)  ?09/05/21 207 lb 12.8 oz (94.3 kg)  ? ?07/13/21 210 lb 9.6 oz (95.5 kg)  ? ?Body Composition Scale Date ?09/22/21 Date ?11/03/21  ?Current Body Weight 205.1 lb 203.3  ?Total Body Fat % 36.3% 36.1%  ?Visceral Fat 8 (healthy level) 8  ?Fat-Free Mass % 63.6% 63.8%  ? Total Body Water % 46.3%   ?Muscle-Mass lbs 35.1 lb   ?BMI 30   ?Body Fat Displacement    ?       Torso  lbs 46.1   ?       Left Leg  lbs 9.2   ?       Right Leg  lbs 9.2   ?       Left Arm  lbs 4.6   ?       Right Arm   lbs 4.6   ?   ? ?Clinical ?Medical Hx: CVA and gestational choriocarcinoma (cancer) in 2017, developed pre-eclampsia during delivery and HTN continued postpartum, sickle cell trait ?Medications: OCP ?Labs: A1c 5.2%; LDL 134 ?Notable Signs/Symptoms: thinning hair per chart, but was not obvious and did not discuss this visit with pt ? ?Lifestyle & Dietary Hx ?Pt states got sick with flue and then eye infection and spend 2 weeks recovering. Appetite decreased but started craving [place] food but only eating once a day. Pt states she has been back in eating routine last ~10 days. Has not increase physical activity. ? ?Pt states she has reduced added sugar and threw out her coconut oil. Pt states she uses air fryer at least once a day. ? ?Pt states one day she made drink with tablespoon sugar, papaya and milk, mom used to make, traditional from [place] and she and her husband enjoy it. ? ?Pt states she feels anxious about  food. She wants to follow a meal plan that is right for her to help her lose weight.  ? ?Estimated daily fluid intake: 128 oz ?Supplements: none ?Sleep: 10p -6:30 a.m. Not a morning person, gets up for children. ?Stress / self-care: not assessed ?Current average weekly physical activity: core rehab from last pregnancy. ? ?24-Hr Dietary Recall ?First Meal: coffee sugar-free flavored syrup with a small spoon and half of sugar (sometimes just coffee) ?1/2 cup oatmeal, almond milk, flaxseed. Granola with pumpkin seeds. ?Snack:  ?Second Meal/snack time varies: boiled eggs, onions, avocado toast ?Snack: none ?Third Meal: 5-7 pm air fried chicken bites, avocado, mixed vegetables ?Snack:  ?Beverages: water, coffee, orange juice ? ?NUTRITION DIAGNOSIS  ?NB-1.1 Food and nutrition-related knowledge deficit As related to healthy fats and added sugar effect on body.  As evidenced by patient states intake changes including using coconut oil instead of olive oil and maple syrup based on effort to eat healthier. ? ? ?NUTRITION INTERVENTION  ?Nutrition education (E-1) on the following topics:  ?Mindful eating ?Anxiety induced carb cravings ?Healthy level of visceral fat shown in body composition (above) ? ?Handouts Provided Include  ?none ? ?Learning Style & Readiness for Change ?Teaching method  utilized: Environmental health practitioner & Auditory  ?Demonstrated degree of understanding via: Teach Back  ?Barriers to learning/adherence to lifestyle change: none ? ?Goals Established by Pt ?Increase activity by walking 30 min 6 days week between 9-11 am ?Continue with how you are reducing added sugar in diet ?Continue eat lots of vegetables daily ?Think about working with your thoughts ?[email or web address] ? ?MONITORING & EVALUATION ?Dietary intake, weekly physical activity, and body comp weight in 6 weeks. ?

## 2021-11-03 NOTE — Patient Instructions (Addendum)
Increase activity by walking 30 min 6 days week between 9-11 am ?Continue with how you are reducing added sugar in diet ?Continue eat lots of vegetables daily ?Think about working with your thoughts ?[email or web address] ?

## 2021-11-13 ENCOUNTER — Ambulatory Visit: Payer: No Typology Code available for payment source | Admitting: Nurse Practitioner

## 2021-11-21 ENCOUNTER — Ambulatory Visit: Payer: No Typology Code available for payment source | Admitting: Nurse Practitioner

## 2021-11-22 ENCOUNTER — Encounter: Payer: Self-pay | Admitting: Nurse Practitioner

## 2021-11-22 ENCOUNTER — Ambulatory Visit (INDEPENDENT_AMBULATORY_CARE_PROVIDER_SITE_OTHER): Payer: No Typology Code available for payment source | Admitting: Nurse Practitioner

## 2021-11-22 DIAGNOSIS — R61 Generalized hyperhidrosis: Secondary | ICD-10-CM | POA: Diagnosis not present

## 2021-11-22 NOTE — Progress Notes (Signed)
Virtual Visit via Telephone Note ? ?I connected with [place] on 11/22/21 at  8:20 AM EDT by telephone and verified that I am speaking with the correct person using two identifiers. ?  ?I discussed the limitations, risks, security and privacy concerns of performing an evaluation and management service by telephone and the availability of in person appointments. I also discussed with the patient that there may be a patient responsible charge related to this service. The patient expressed understanding and agreed to proceed. ? ?Patient home ?Provider Office ? ?History of Present Illness: ? ?[place]  has a past medical history of Cancer [facility]), Hypertension, and Stroke [facility]).  ? ?Currently concerned about night sweats x 3 wks. States that when night sweats, fever and other symptoms consistent with the flu. All other symptoms, including fever have resolved, however; she continues to have night sweats. States, " I wake up in the morning drenched in sweat." Denies any other concerns. Currently on birth control and has regular menstrual cycle.  ? ?Denies any fatigue, chest pain, shortness of breath, HA or dizziness. Denies any blurred vision, numbness or tingling. ? ? ?  ?Review of Systems  ?Constitutional:  Negative for chills, fever and malaise/fatigue.  ?     See HPI  ?Respiratory:  Negative for cough and shortness of breath.   ?Cardiovascular:  Negative for chest pain, palpitations and leg swelling.  ?Gastrointestinal:  Negative for abdominal pain, blood in stool, constipation, diarrhea, nausea and vomiting.  ?Skin: Negative.   ?Psychiatric/Behavioral:  Negative for depression. The patient is not nervous/anxious.   ?All other systems reviewed and are negative.  ? ?Observations/Objective: ?No exam; telephone visit ? ?Assessment and Plan: ?1. Night sweats ?Discussed possible causes ?Given anticipatory guidance  ? ?Follow Up Instructions: ?Informed that if symptoms continue, to follow up in clinic  in 2 wks for additional evaluation and labs  ?Discussed non pharmacological methods for management of symptoms ?Informed to take OTC medications as needed ? ?  ?I discussed the assessment and treatment plan with the patient. The patient was provided an opportunity to ask questions and all were answered. The patient agreed with the plan and demonstrated an understanding of the instructions. ?  ?The patient was advised to call back or seek an in-person evaluation if the symptoms worsen or if the condition fails to improve as anticipated. ? ?I provided 20 minutes of telephone- visit time during this encounter. ? ? ?Teena Dunk, NP ?

## 2021-12-13 ENCOUNTER — Ambulatory Visit (INDEPENDENT_AMBULATORY_CARE_PROVIDER_SITE_OTHER): Payer: No Typology Code available for payment source

## 2021-12-13 VITALS — BP 140/87 | HR 69 | Wt 206.6 lb

## 2021-12-13 DIAGNOSIS — R309 Painful micturition, unspecified: Secondary | ICD-10-CM

## 2021-12-13 DIAGNOSIS — R350 Frequency of micturition: Secondary | ICD-10-CM

## 2021-12-13 DIAGNOSIS — Z3042 Encounter for surveillance of injectable contraceptive: Secondary | ICD-10-CM

## 2021-12-13 LAB — POCT URINALYSIS DIP (DEVICE)
Bilirubin Urine: NEGATIVE
Glucose, UA: NEGATIVE mg/dL
Ketones, ur: NEGATIVE mg/dL
Leukocytes,Ua: NEGATIVE
Nitrite: NEGATIVE
Protein, ur: NEGATIVE mg/dL
Specific Gravity, Urine: >=1.03 (ref 1.005–1.030)
Urobilinogen, UA: 0.2 mg/dL (ref 0.0–1.0)
pH: 5.5 (ref 5.0–8.0)

## 2021-12-13 MED ORDER — MEDROXYPROGESTERONE ACETATE 150 MG/ML IM SUSP
150.0000 mg | Freq: Once | INTRAMUSCULAR | Status: AC
  Administered 2021-12-13: 150 mg via INTRAMUSCULAR

## 2021-12-13 NOTE — Progress Notes (Signed)
[place] here for Depo-Provera Injection. Injection administered without complication. Patient will return in 3 months for next injection between July 19th and August 2nd. Next annual visit due December 2023.  ? ?Patient also has complaint of painful urination and urinary frequency. Urine analysis completed. Urine culture collected. I explained to patient we will notify her with abnormal results. Patient verbalized understanding and denies any other questions.  ? ?Seth Bake, RN ?12/13/2021   ?

## 2021-12-15 LAB — URINE CULTURE: Organism ID, Bacteria: NO GROWTH

## 2022-01-31 ENCOUNTER — Ambulatory Visit (INDEPENDENT_AMBULATORY_CARE_PROVIDER_SITE_OTHER): Payer: No Typology Code available for payment source

## 2022-01-31 ENCOUNTER — Ambulatory Visit (INDEPENDENT_AMBULATORY_CARE_PROVIDER_SITE_OTHER): Payer: No Typology Code available for payment source | Admitting: Orthopaedic Surgery

## 2022-01-31 DIAGNOSIS — M5442 Lumbago with sciatica, left side: Secondary | ICD-10-CM | POA: Diagnosis not present

## 2022-01-31 DIAGNOSIS — G8929 Other chronic pain: Secondary | ICD-10-CM | POA: Diagnosis not present

## 2022-01-31 MED ORDER — PREDNISONE 10 MG (21) PO TBPK: ORAL_TABLET | ORAL | 0 refills | Status: DC

## 2022-01-31 MED ORDER — METHOCARBAMOL 500 MG PO TABS: 500.0000 mg | ORAL_TABLET | Freq: Two times a day (BID) | ORAL | 2 refills | Status: DC | PRN

## 2022-01-31 MED ORDER — TRAMADOL HCL 50 MG PO TABS: 50.0000 mg | ORAL_TABLET | Freq: Two times a day (BID) | ORAL | 1 refills | Status: DC | PRN

## 2022-01-31 NOTE — Progress Notes (Signed)
Office Visit Note   Patient: Janice Stewart           Date of Birth: 1986/06/03           MRN: [id number] Visit Date: 01/31/2022              Requested by: [name] I, NP [facility] [address] [place],  [place] [address] PCP: [name] I, NP   Assessment & Plan: Visit Diagnoses:  1. 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  At this point, would like to make referral to Dr. Inda Merlin for further evaluation and treatment recommendation.  Patient understands and agrees.  In the meantime, have sent in a steroid taper, muscle relaxer and tramadol.  She will call us with any concerns or questions.  Follow-Up Instructions: Return for Dr. Lorin Mercy for further eval.   Orders:  Orders Placed This Encounter  Procedures   XR Lumbar Spine 2-3 Views   Meds ordered this encounter  Medications   predniSONE (STERAPRED UNI-PAK 21 TAB) 10 MG (21) TBPK tablet    Sig: Take as directed    Dispense:  21 tablet    Refill:  0   methocarbamol (ROBAXIN) 500 MG tablet    Sig: Take 1 tablet (500 mg total) by mouth 2 (two) times daily as needed for muscle spasms.    Dispense:  20 tablet    Refill:  2   traMADol (ULTRAM) 50 MG tablet    Sig: Take 1 tablet (50 mg total) by mouth every 12 (twelve) hours as needed.    Dispense:  30 tablet    Refill:  1      Procedures: No procedures performed   Clinical Data: No additional findings.   Subjective: Chief Complaint  Patient presents with   Lower Back - Pain    HPI patient is a pleasant 36 year old female who comes in today with chronic left-sided low back pain.  This began a few years ago after giving birth to her second child.  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  Shortly after she sustained a CVA.  She has been seen by  Dr. Junius Roads previously for left-sided SI joint pain where she was referred to physical therapy which provided slight relief with core strengthening.  She was then referred to Dr. Ernestina Patches for SI joint injection and MRI.  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  She notes that the injection did not provide any relief.  She is now having pain primarily to the upper lumbar levels on the left side.  Symptoms appear to be slight discomfort at rest but sharp pain when she is turning in the bed or when she goes from a seated to standing position.  She denies any radicular symptoms down either leg.  No paresthesias.  She takes occasional Tylenol Motrin without significant relief.  Review of Systems as detailed in HPI.  All others reviewed and are negative.   Objective: Vital Signs: There were no vitals taken for this visit.  Physical Exam well-developed well-nourished female no acute distress.  Alert and oriented x3.  Ortho Exam lumbar spine exam shows no spinous tenderness.  She does have moderate left-sided paraspinous tenderness around L1-L2.  Slight increased pain with lumbar flexion.  No  pain with extension or rotation.  Negative straight leg raise.  No focal weakness.  She is neurovascular tact distally.  Specialty Comments:  No specialty comments available.  Imaging: XR Lumbar Spine 2-3 Views  Result Date: 01/31/2022 Patient has evidence of scoliosis seen on x-ray.  Otherwise, no acute findings or structural abnormalities    PMFS History: Patient Active Problem List   Diagnosis Date Noted   Simple obesity 09/22/2021   Post-dates pregnancy 07/17/2020   Sickle cell trait ([place]) 02/22/2020   Gestational choriocarcinoma ([place]) in 2017 01/25/2020   History of CVA (cerebrovascular accident) 01/25/2020   Past Medical History:  Diagnosis Date   Cancer ([place])    Hypertension    Stroke ([place])     Family History  Problem Relation Age of Onset   Varicose Veins Mother    Hypertension Father     Cancer Father        prostate    Past Surgical History:  Procedure Laterality Date   LIVER BIOPSY  2017   LUNG BIOPSY  2017   Social History   Occupational History   Not on file  Tobacco Use   Smoking status: Never   Smokeless tobacco: Never  Vaping Use   Vaping Use: Never used  Substance and Sexual Activity   Alcohol use: Not Currently   Drug use: Never   Sexual activity: Yes    Birth control/protection: Injection    Comment: DEPO-PROVERA

## 2022-02-21 ENCOUNTER — Ambulatory Visit (INDEPENDENT_AMBULATORY_CARE_PROVIDER_SITE_OTHER): Payer: No Typology Code available for payment source | Admitting: Orthopaedic Surgery

## 2022-02-21 ENCOUNTER — Encounter: Payer: Self-pay | Admitting: Orthopaedic Surgery

## 2022-02-21 VITALS — BP 141/94 | HR 60 | Ht 69.0 in | Wt 207.0 lb

## 2022-02-21 DIAGNOSIS — D573 Sickle-cell trait: Secondary | ICD-10-CM

## 2022-02-21 DIAGNOSIS — G8929 Other chronic pain: Secondary | ICD-10-CM

## 2022-02-21 DIAGNOSIS — M5442 Lumbago with sciatica, left side: Secondary | ICD-10-CM | POA: Diagnosis not present

## 2022-02-21 DIAGNOSIS — M879 Osteonecrosis, unspecified: Secondary | ICD-10-CM

## 2022-02-21 NOTE — Progress Notes (Signed)
   Office Visit Note   Patient: Janice Stewart           Date of Birth: Mar 31, 1986           MRN: [id number] Visit Date: 02/21/2022              Requested by: [name] I, NP No address on file PCP: Teena Dunk, NP   Assessment & Plan: Visit Diagnoses: No diagnosis found.  Plan: ***  Follow-Up Instructions: No follow-ups on file.   Orders:  No orders of the defined types were placed in this encounter.  No orders of the defined types were placed in this encounter.     Procedures: No procedures performed   Clinical Data: No additional findings.   Subjective: Chief Complaint  Patient presents with  . Lower Back - Pain    HPI  Review of Systems   Objective: Vital Signs: BP (!) 141/94   Pulse 60   Ht '5\' 9"'$  (1.753 m)   Wt 207 lb (93.9 kg)   BMI 30.57 kg/m   Physical Exam  Ortho Exam  Specialty Comments:  No specialty comments available.  Imaging: No results found.   PMFS History: Patient Active Problem List   Diagnosis Date Noted  . Simple obesity 09/22/2021  . Post-dates pregnancy 07/17/2020  . Sickle cell trait ([place]) 02/22/2020  . Gestational choriocarcinoma ([place]) in 2017 01/25/2020  . History of CVA (cerebrovascular accident) 01/25/2020   Past Medical History:  Diagnosis Date  . Cancer ([place])   . Hypertension   . Stroke [facility])     Family History  Problem Relation Age of Onset  . Varicose Veins Mother   . Hypertension Father   . Cancer Father        prostate    Past Surgical History:  Procedure Laterality Date  . LIVER BIOPSY  2017  . LUNG BIOPSY  2017   Social History   Occupational History  . Not on file  Tobacco Use  . Smoking status: Never  . Smokeless tobacco: Never  Vaping Use  . Vaping Use: Never used  Substance and Sexual Activity  . Alcohol use: Not Currently  . Drug use: Never  . Sexual activity: Yes    Birth control/protection: Injection    Comment: DEPO-PROVERA

## 2022-02-22 DIAGNOSIS — M879 Osteonecrosis, unspecified: Secondary | ICD-10-CM | POA: Insufficient documentation

## 2022-02-28 ENCOUNTER — Ambulatory Visit (INDEPENDENT_AMBULATORY_CARE_PROVIDER_SITE_OTHER): Payer: No Typology Code available for payment source

## 2022-02-28 ENCOUNTER — Other Ambulatory Visit: Payer: Self-pay

## 2022-02-28 VITALS — BP 144/94 | HR 75 | Wt 209.7 lb

## 2022-02-28 DIAGNOSIS — Z3042 Encounter for surveillance of injectable contraceptive: Secondary | ICD-10-CM

## 2022-02-28 MED ORDER — MEDROXYPROGESTERONE ACETATE 150 MG/ML IM SUSP
150.0000 mg | Freq: Once | INTRAMUSCULAR | Status: AC
  Administered 2022-02-28: 150 mg via INTRAMUSCULAR

## 2022-02-28 NOTE — Patient Instructions (Signed)
[facility] for Primary Care Provider  Address: [address], [place], [place] [address] Phone: [phone]

## 2022-02-28 NOTE — Progress Notes (Signed)
[place] here for Depo-Provera Injection. Injection administered without complication. Patient will return in 3 months for next injection between October 4 and October 18. Next annual visit due December 2023.   Patient has elevated blood pressure today at 144/94. I recommended patient follow up with her PCP in regards to managing her BP. Patient verbalized understanding and denies any questions.   Seth Bake, RN 02/28/2022

## 2022-03-04 ENCOUNTER — Ambulatory Visit
Admission: RE | Admit: 2022-03-04 | Discharge: 2022-03-04 | Disposition: A | Discharge status: Home / Self Care | Payer: No Typology Code available for payment source | Source: Ambulatory Visit | Attending: Orthopaedic Surgery | Admitting: Orthopaedic Surgery

## 2022-03-04 DIAGNOSIS — G8929 Other chronic pain: Secondary | ICD-10-CM

## 2022-03-23 ENCOUNTER — Encounter: Payer: Self-pay | Admitting: Orthopaedic Surgery

## 2022-03-23 ENCOUNTER — Ambulatory Visit (INDEPENDENT_AMBULATORY_CARE_PROVIDER_SITE_OTHER): Payer: No Typology Code available for payment source | Admitting: Orthopaedic Surgery

## 2022-03-23 DIAGNOSIS — M545 Low back pain, unspecified: Secondary | ICD-10-CM

## 2022-03-26 DIAGNOSIS — M545 Low back pain, unspecified: Secondary | ICD-10-CM | POA: Insufficient documentation

## 2022-03-26 NOTE — Progress Notes (Signed)
Office Visit Note   Patient: Janice Stewart           Date of Birth: 1986-02-01           MRN: [id number] Visit Date: 03/23/2022              Requested by: [name] I, NP No address on file PCP: Teena Dunk, NP   Assessment & Plan: Visit Diagnoses: Low back pain  Plan: We discussed working on a walking program with the children continuing the therapy exercises for core strengthening.  MRI scan was reviewed today.  She has no significant degenerative changes in the lumbar spine.  Follow-Up Instructions: No follow-ups on file.   Orders:  No orders of the defined types were placed in this encounter.  No orders of the defined types were placed in this encounter.     Procedures: No procedures performed   Clinical Data: No additional findings.   Subjective: Chief Complaint  Patient presents with   Lower Back - Pain    HPI 36 year old female here with her children with ongoing problems with back pain stiffness in the morning aching pain somewhat more over the left flank without any urinary tract symptoms.  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  She states she does have normal amount of stress of daily living with her children.  Some increased pain during her menstrual cycle.  No fever chills no bowel bladder symptoms.  No improvement with the prednisone Dosepak, muscle relaxants and tramadol.  Review of Systems all other systems updated unchanged from 02/21/2022.   Objective: Vital Signs: BP (!) 150/93   Pulse 77   Ht '5\' 9"'$  (1.753 m)   Wt 206 lb (93.4 kg)   LMP 02/14/2022 (Approximate)   BMI 30.42 kg/m   Physical Exam Constitutional:      Appearance: She is well-developed.  HENT:     Head: Normocephalic.     Right Ear: External ear normal.     Left Ear: External ear normal. There is no impacted cerumen.  Eyes:     Pupils: Pupils are equal, round, and reactive to light.   Neck:     Thyroid: No thyromegaly.     Trachea: No tracheal deviation.  Cardiovascular:     Rate and Rhythm: Normal rate.  Pulmonary:     Effort: Pulmonary effort is normal.  Abdominal:     Palpations: Abdomen is soft.  Musculoskeletal:     Cervical back: No rigidity.  Skin:    General: Skin is warm and dry.  Neurological:     Mental Status: She is alert and oriented to person, place, and time.  Psychiatric:        Behavior: Behavior normal.     Ortho Exam reflexes are intact negative logroll of the hips.  Some tenderness over the left paralumbar muscle.  Pulses are normal no pitting edema good lower extremity strength.  Normal heel toe walking.  Specialty Comments:  No specialty comments available.  Imaging: CLINICAL DATA:  Low back pain, symptoms persist with > 6 wks treatment   EXAM: MRI LUMBAR SPINE WITHOUT CONTRAST   TECHNIQUE: Multiplanar, multisequence MR imaging of the lumbar spine was performed. No intravenous contrast was administered.   COMPARISON:  MRI pelvis 06/24/2021.  X-ray 01/31/2022   FINDINGS: Segmentation:  Standard.   Alignment:  Physiologic.   Vertebrae: No acute fracture. No evidence of discitis. Stable  size and appearance of bone lesion in the S1 segment with heterogeneously high T2 signal and peripherally high T1 signal measuring approximately 4.9 x 2.7 x 3.2 cm (series 4, image 7). Lesion is non expansile. No adjacent marrow edema is seen. No additional lesions.   Conus medullaris and cauda equina: Conus extends to the L2 level. Conus and cauda equina appear normal.   Paraspinal and other soft tissues: Negative.   Disc levels:   Negative. Intervertebral disc heights are preserved without disc desiccation or focal disc protrusion. Normal facet joints. No foraminal or canal stenosis at any level.   IMPRESSION: 1. No significant degenerative changes of the lumbar spine. No foraminal or canal stenosis at any level. 2. Stable size  and appearance of bone lesion in the S1 segment, favoring a benign entity such as a bone infarct or atypical hemangioma.     Electronically Signed   By: Davina Poke D.O.   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[place]) 02/22/2022   Simple obesity 09/22/2021   Post-dates pregnancy 07/17/2020   Sickle cell trait ([place]) 02/22/2020   Gestational choriocarcinoma ([place]) in 2017 01/25/2020   History of CVA (cerebrovascular accident) 01/25/2020   Past Medical History:  Diagnosis Date   Cancer ([place])    Hypertension    Stroke ([place])     Family History  Problem Relation Age of Onset   Varicose Veins Mother    Hypertension Father    Cancer Father        prostate    Past Surgical History:  Procedure Laterality Date   LIVER BIOPSY  2017   LUNG BIOPSY  2017   Social History   Occupational History   Not on file  Tobacco Use   Smoking status: Never   Smokeless tobacco: Never  Vaping Use   Vaping Use: Never used  Substance and Sexual Activity   Alcohol use: Not Currently   Drug use: Never   Sexual activity: Yes    Birth control/protection: Injection    Comment: DEPO-PROVERA

## 2022-04-26 NOTE — Progress Notes (Signed)
Patient ID: Janice Stewart, female    DOB: Jun 11, 1986  MRN: [id number]  CC: Annual Physical Exam  Subjective: Janice Stewart is a 36 y.o. female who presents for annual physical exam.   Her concerns today include:  - Patient reports she is here to reestablish primary care at our office. States she was told last year that Janice Merino, NP would be her primary provider and manage her sickle cell trait as well. States she prefers to remain at our office for primary care and see specialist for sickle cell trait only.  - Persisting hair loss. She was seen by Dermatology at [facility] in 2022. States provider told her that her hair loss is due to postpartum as she had recently had a baby at that time. No treatment was prescribed. Today patient reports her baby is almost 2 years-old so does not think hair loss was related to postpartum. She is requesting to not return to that office.  - Having decreased libido. Initially thought related to postpartum. She is established with Gynecology but unsure if they will discuss with her, states she will ask them to see.  - Night sweats persisting. States she discussed with Janice Merino, NP earlier this year during an appointment. Reports labs were obtained but she never heard back regarding results.  - She is no longer taking garlic cloves for high blood pressure. States she got tired of peeling garlic cloves. Denies red flag symptoms associated with high blood pressure. States she would like to try another blood pressure medication. In the past thought Amlodipine was causing hair loss.  - Requesting to see a foot doctor. Noticed bilateral great toenails changing colors. Right > left. Reports right great toenail was yellow, then red, now brown. Endorses clear drainage from right great toenail and toenail lifting from nailbed. Left great toenail has experienced color changes but not to the extent of the right great toenail. States she  usually lets her toenails grow out to prevent ingrown toenails. Within the last several months recalls wearing boots which were tight and made symptoms worse. Since then she has cut her toenails.  - She is established with Orthopedics for management of back pain.    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) 02/22/2022   Simple obesity 09/22/2021   Post-dates pregnancy 07/17/2020   Sickle cell trait ([place]) 02/22/2020   Gestational choriocarcinoma ([place]) in 2017 01/25/2020   History of CVA (cerebrovascular accident) 01/25/2020     Current Outpatient Medications on File Prior to Visit  Medication Sig Dispense Refill   medroxyPROGESTERone (DEPO-PROVERA) 150 MG/ML injection Inject 150 mg into the muscle every 3 (three) months.     No current facility-administered medications on file prior to visit.    No Known Allergies  Social History   Socioeconomic History   Marital status: Single    Spouse name: Not on file   Number of children: Not on file   Years of education: Not on file   Highest education level: Not on file  Occupational History   Not on file  Tobacco Use   Smoking status: Never   Smokeless tobacco: Never  Vaping Use   Vaping Use: Never used  Substance and Sexual Activity   Alcohol use: Not Currently   Drug use: Never   Sexual activity: Yes    Birth control/protection: Injection    Comment: DEPO-PROVERA  Other Topics Concern   Not on file  Social History Narrative   Not on file   Social Determinants of Health   Financial Resource Strain: Not on file  Food Insecurity: No Food Insecurity (09/22/2021)   Hunger Vital Sign    Worried About Running Out of Food in the Last Year: Never true    Ran Out of Food in the Last Year: Never true  Transportation Needs: No Transportation Needs (06/14/2021)   PRAPARE - Hydrologist (Medical): No    Lack of Transportation (Non-Medical): No   Physical Activity: Not on file  Stress: Not on file  Social Connections: Not on file  Intimate Partner Violence: Not on file    Family History  Problem Relation Age of Onset   Varicose Veins Mother    Hypertension Father    Cancer Father        prostate    Past Surgical History:  Procedure Laterality Date   LIVER BIOPSY  2017   LUNG BIOPSY  2017    ROS: Review of Systems Negative except as stated above  PHYSICAL EXAM: BP (!) 149/89 (BP Location: Left Arm, Patient Position: Sitting, Cuff Size: Large)   Pulse 77   Temp 98.6 F (37 C) (Oral)   Resp 16   Ht _0  (1.753 m)   Wt 203 lb (92.1 kg)   LMP 04/30/2022   SpO2 99%   Breastfeeding No   BMI 29.98 kg/m   Physical Exam HENT:     Head: Normocephalic and atraumatic.     Right Ear: Tympanic membrane, ear canal and external ear normal.     Left Ear: Tympanic membrane, ear canal and external ear normal.     Nose: Nose normal.     Mouth/Throat:     Mouth: Mucous membranes are moist.     Pharynx: Oropharynx is clear.  Eyes:     Extraocular Movements: Extraocular movements intact.     Conjunctiva/sclera: Conjunctivae normal.     Pupils: Pupils are equal, round, and reactive to light.  Cardiovascular:     Rate and Rhythm: Normal rate and regular rhythm.     Pulses: Normal pulses.     Heart sounds: Normal heart sounds.  Pulmonary:     Effort: Pulmonary effort is normal.     Breath sounds: Normal breath sounds.  Chest:     Comments: Patient declined.  Abdominal:     General: Bowel sounds are normal.     Palpations: Abdomen is soft.  Genitourinary:    Comments: Patient declined.  Musculoskeletal:        General: Normal range of motion.     Right shoulder: Normal.     Left shoulder: Normal.     Right upper arm: Normal.     Left upper arm: Normal.     Right elbow: Normal.     Left elbow: Normal.     Right forearm: Normal.     Left forearm: Normal.     Right wrist: Normal.     Left wrist: Normal.      Right hand: Normal.     Left hand: Normal.     Cervical back: Normal, normal range of motion and neck supple.     Thoracic back: Normal.     Lumbar back: Normal.     Right hip: Normal.     Left hip: Normal.     Right upper leg: Normal.     Left upper leg:  Normal.     Right knee: Normal.     Left knee: Normal.     Right lower leg: Normal.     Left lower leg: Normal.     Right ankle: Normal.     Left ankle: Normal.     Right foot: Normal.     Left foot: Normal.  Feet:     Comments: Bilateral great toenails discoloration.  Skin:    General: Skin is warm and dry.     Capillary Refill: Capillary refill takes less than 2 seconds.  Neurological:     General: No focal deficit present.     Mental Status: She is alert and oriented to person, place, and time.  Psychiatric:        Mood and Affect: Mood normal.        Behavior: Behavior normal.     ASSESSMENT AND PLAN: 1. Annual physical exam - Counseled on 150 minutes of exercise per week as tolerated, healthy eating (including decreased daily intake of saturated fats, cholesterol, added sugars, sodium), STI prevention, and routine healthcare maintenance.  2. Screening for metabolic disorder - Routine screening.  - CMP14+EGFR  3. Screening for deficiency anemia - Routine screening.  - CBC  4. Diabetes mellitus screening - Routine screening.  - Hemoglobin A1c  5. Screening cholesterol level - Routine screening. - Lipid panel  6. Thyroid disorder screen - Routine screening.  - TSH  7. Encounter for vitamin deficiency screening - Routine screening.  - Multiple vitamins-minerals as prescribed. Counseled on medication adherence.  - Multiple Vitamins-Minerals (MULTIVITAMIN) tablet; Take 1 tablet by mouth daily.  Dispense: 30 tablet; Refill: 2 - Vitamin D, 25-hydroxy  8. Primary hypertension - Blood pressure not at goal during today's visit. Patient asymptomatic without chest pressure, chest pain, palpitations, shortness of  breath, worst headache of life, and any additional red flag symptoms. - Begin Valsartan as prescribed. Counseled on medication adherence and adverse effects.  - Counseled on blood pressure goal of less than 130/80, low-sodium, DASH diet, medication compliance, and 150 minutes of moderate intensity exercise per week as tolerated. Counseled on medication adherence and adverse effects. - Follow-up with primary provider in 2 weeks or sooner if needed for blood pressure check.  - valsartan (DIOVAN) 40 MG tablet; Take 1 tablet (40 mg total) by mouth daily.  Dispense: 30 tablet; Refill: 2  9. Great toe pain, right 10. Nail discoloration - Mupirocin ointment as prescribed. Counseled on medication adherence.  - Referral to Podiatry for further evaluation and management.  - Ambulatory referral to Podiatry - mupirocin ointment (BACTROBAN) 2 %; Apply 1 Application topically 2 (two) times daily.  Dispense: 60 g; Refill: 0  11. Hair loss - Referral to Dermatology for further evaluation and management.  - Ambulatory referral to Dermatology  12. Decreased libido 13. Night sweats - Referral to Gynecology for further evaluation and management.  - Ambulatory referral to Gynecology  14. Sickle cell trait ([place]) - Referral to Hematology / Oncology for further evaluation and management.  - Ambulatory referral to Hematology / Oncology  15. Chronic back pain, unspecified back location, unspecified back pain laterality 16. Bone infarct [facility]) - Keep all scheduled appointments with Orthopedics.   17. Need for immunization against influenza - Administered.  - Flu Vaccine QUAD 61moIM (Fluarix, Fluzone & Alfiuria Quad PF)    Patient was given the opportunity to ask questions.  Patient verbalized understanding of the plan and was able to repeat key elements of the plan. Patient was  given clear instructions to go to Emergency Department or return to medical center if symptoms don't improve, worsen, or new  problems develop.The patient verbalized understanding.   Orders Placed This Encounter  Procedures   Flu Vaccine QUAD 86moIM (Fluarix, Fluzone & Alfiuria Quad PF)   CMP14+EGFR   CBC   Hemoglobin A1c   Lipid panel   TSH   Vitamin D, 25-hydroxy   Ambulatory referral to Podiatry   Ambulatory referral to Dermatology   Ambulatory referral to Gynecology   Ambulatory referral to Hematology / Oncology     Requested Prescriptions   Signed Prescriptions Disp Refills   valsartan (DIOVAN) 40 MG tablet 30 tablet 2    Sig: Take 1 tablet (40 mg total) by mouth daily.   Multiple Vitamins-Minerals (MULTIVITAMIN) tablet 30 tablet 2    Sig: Take 1 tablet by mouth daily.   mupirocin ointment (BACTROBAN) 2 % 60 g 0    Sig: Apply 1 Application topically 2 (two) times daily.    Return in about 1 year (around 05/11/2023) for Physical per patient preference, Follow-Up or next available 2 week bp check.  ACamillia Herter NP

## 2022-05-10 ENCOUNTER — Encounter: Payer: Self-pay | Admitting: Family

## 2022-05-10 ENCOUNTER — Ambulatory Visit (INDEPENDENT_AMBULATORY_CARE_PROVIDER_SITE_OTHER): Payer: No Typology Code available for payment source | Admitting: Family

## 2022-05-10 VITALS — BP 149/89 | HR 77 | Temp 98.6°F | Resp 16 | Ht 69.0 in | Wt 203.0 lb

## 2022-05-10 DIAGNOSIS — Z1329 Encounter for screening for other suspected endocrine disorder: Secondary | ICD-10-CM

## 2022-05-10 DIAGNOSIS — Z13220 Encounter for screening for lipoid disorders: Secondary | ICD-10-CM

## 2022-05-10 DIAGNOSIS — Z131 Encounter for screening for diabetes mellitus: Secondary | ICD-10-CM | POA: Diagnosis not present

## 2022-05-10 DIAGNOSIS — R61 Generalized hyperhidrosis: Secondary | ICD-10-CM

## 2022-05-10 DIAGNOSIS — R6882 Decreased libido: Secondary | ICD-10-CM

## 2022-05-10 DIAGNOSIS — M549 Dorsalgia, unspecified: Secondary | ICD-10-CM

## 2022-05-10 DIAGNOSIS — L608 Other nail disorders: Secondary | ICD-10-CM

## 2022-05-10 DIAGNOSIS — Z0000 Encounter for general adult medical examination without abnormal findings: Secondary | ICD-10-CM | POA: Diagnosis not present

## 2022-05-10 DIAGNOSIS — Z23 Encounter for immunization: Secondary | ICD-10-CM | POA: Diagnosis not present

## 2022-05-10 DIAGNOSIS — M79674 Pain in right toe(s): Secondary | ICD-10-CM

## 2022-05-10 DIAGNOSIS — G8929 Other chronic pain: Secondary | ICD-10-CM

## 2022-05-10 DIAGNOSIS — M879 Osteonecrosis, unspecified: Secondary | ICD-10-CM

## 2022-05-10 DIAGNOSIS — D573 Sickle-cell trait: Secondary | ICD-10-CM

## 2022-05-10 DIAGNOSIS — Z130 Encounter for screening for diseases of the blood and blood-forming organs and certain disorders involving the immune mechanism: Secondary | ICD-10-CM

## 2022-05-10 DIAGNOSIS — L659 Nonscarring hair loss, unspecified: Secondary | ICD-10-CM

## 2022-05-10 DIAGNOSIS — Z13228 Encounter for screening for other metabolic disorders: Secondary | ICD-10-CM | POA: Diagnosis not present

## 2022-05-10 DIAGNOSIS — I10 Essential (primary) hypertension: Secondary | ICD-10-CM

## 2022-05-10 DIAGNOSIS — Z1321 Encounter for screening for nutritional disorder: Secondary | ICD-10-CM

## 2022-05-10 MED ORDER — MUPIROCIN 2 % EX OINT: 1.0000 | TOPICAL_OINTMENT | Freq: Two times a day (BID) | CUTANEOUS | 0 refills | Status: DC

## 2022-05-10 MED ORDER — VALSARTAN 40 MG PO TABS: 40.0000 mg | ORAL_TABLET | Freq: Every day | ORAL | 2 refills | Status: DC

## 2022-05-10 MED ORDER — THERA VITAL M PO TABS: 1.0000 | ORAL_TABLET | Freq: Every day | ORAL | 2 refills | Status: AC

## 2022-05-10 NOTE — Patient Instructions (Signed)

## 2022-05-10 NOTE — Progress Notes (Signed)
Concerns with continued hair loss Referral for foot doctor ( toe nail issue)  Post partum libido Midnight sweats

## 2022-05-11 LAB — CBC
Hematocrit: 35.6 % (ref 34.0–46.6)
Hemoglobin: 11.8 g/dL (ref 11.1–15.9)
MCH: 28.6 pg (ref 26.6–33.0)
MCHC: 33.1 g/dL (ref 31.5–35.7)
MCV: 86 fL (ref 79–97)
Platelets: 249 10*3/uL (ref 150–450)
RBC: 4.13 x10E6/uL (ref 3.77–5.28)
RDW: 13.3 % (ref 11.7–15.4)
WBC: 5.6 10*3/uL (ref 3.4–10.8)

## 2022-05-11 LAB — CMP14+EGFR
ALT: 9 IU/L (ref 0–32)
AST: 11 IU/L (ref 0–40)
Albumin/Globulin Ratio: 1.7 (ref 1.2–2.2)
Albumin: 4.8 g/dL (ref 3.9–4.9)
Alkaline Phosphatase: 64 IU/L (ref 44–121)
BUN/Creatinine Ratio: 13 (ref 9–23)
BUN: 11 mg/dL (ref 6–20)
Bilirubin Total: 0.3 mg/dL (ref 0.0–1.2)
CO2: 21 mmol/L (ref 20–29)
Calcium: 9.5 mg/dL (ref 8.7–10.2)
Chloride: 103 mmol/L (ref 96–106)
Creatinine, Ser: 0.86 mg/dL (ref 0.57–1.00)
Globulin, Total: 2.8 g/dL (ref 1.5–4.5)
Glucose: 74 mg/dL (ref 70–99)
Potassium: 4.4 mmol/L (ref 3.5–5.2)
Sodium: 138 mmol/L (ref 134–144)
Total Protein: 7.6 g/dL (ref 6.0–8.5)
eGFR: 90 mL/min/{1.73_m2} (ref >59)

## 2022-05-11 LAB — TSH: TSH: 1.21 u[IU]/mL (ref 0.450–4.500)

## 2022-05-11 LAB — LIPID PANEL
Chol/HDL Ratio: 3.7 ratio (ref 0.0–4.4)
Cholesterol, Total: 191 mg/dL (ref 100–199)
HDL: 51 mg/dL (ref >39)
LDL Chol Calc (NIH): 129 mg/dL — ABNORMAL HIGH (ref 0–99)
Triglycerides: 61 mg/dL (ref 0–149)
VLDL Cholesterol Cal: 11 mg/dL (ref 5–40)

## 2022-05-11 LAB — HEMOGLOBIN A1C
Est. average glucose Bld gHb Est-mCnc: 100 mg/dL
Hgb A1c MFr Bld: 5.1 % (ref 4.8–5.6)

## 2022-05-16 ENCOUNTER — Ambulatory Visit (INDEPENDENT_AMBULATORY_CARE_PROVIDER_SITE_OTHER): Payer: No Typology Code available for payment source | Admitting: General Practice

## 2022-05-16 VITALS — BP 132/95 | HR 72 | Ht 69.0 in | Wt 199.0 lb

## 2022-05-16 DIAGNOSIS — Z3042 Encounter for surveillance of injectable contraceptive: Secondary | ICD-10-CM | POA: Diagnosis not present

## 2022-05-16 MED ORDER — MEDROXYPROGESTERONE ACETATE 150 MG/ML IM SUSP
150.0000 mg | Freq: Once | INTRAMUSCULAR | Status: AC
  Administered 2022-05-16: 150 mg via INTRAMUSCULAR

## 2022-05-16 NOTE — Progress Notes (Signed)
Patient ID: Janice Stewart, female    DOB: 1985-10-15  MRN: [id number]  CC: Blood Pressure Check  Subjective: Janice Stewart Dust is a 36 y.o. female who presents for blood pressure check.   Her concerns today include:  Doing well on blood pressure medication, no issues or concerns. Has questions regarding cholesterol lab from last appointment. Discussed with patient total cholesterol and LDL cholesterol has improved compared to last year. No known family history of high cholesterol. Offered to begin cholesterol medication patient declined for now. Discussed with patient to continue with heart healthy diet and plan to recheck fasting cholesterol at next appointment in 3 months. Patient agreeable.  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) 02/22/2022   Simple obesity 09/22/2021   Post-dates pregnancy 07/17/2020   Sickle cell trait ([place]) 02/22/2020   Gestational choriocarcinoma ([place]) in 2017 01/25/2020   History of CVA (cerebrovascular accident) 01/25/2020     Current Outpatient Medications on File Prior to Visit  Medication Sig Dispense Refill   cephALEXin (KEFLEX) 500 MG capsule Take 1 capsule (500 mg total) by mouth 3 (three) times daily. 21 capsule 0   medroxyPROGESTERone (DEPO-PROVERA) 150 MG/ML injection Inject 150 mg into the muscle every 3 (three) months.     Multiple Vitamins-Minerals (MULTIVITAMIN) tablet Take 1 tablet by mouth daily. 30 tablet 2   mupirocin ointment (BACTROBAN) 2 % Apply 1 Application topically 2 (two) times daily. 60 g 0   valsartan (DIOVAN) 40 MG tablet Take 1 tablet (40 mg total) by mouth daily. 30 tablet 2   No current facility-administered medications on file prior to visit.    No Known Allergies  Social History   Socioeconomic History   Marital status: Single    Spouse name: Not on file   Number of children: Not on file   Years of education: Not on file   Highest  education level: Not on file  Occupational History   Not on file  Tobacco Use   Smoking status: Never    Passive exposure: Never   Smokeless tobacco: Never  Vaping Use   Vaping Use: Never used  Substance and Sexual Activity   Alcohol use: Not Currently   Drug use: Never   Sexual activity: Yes    Birth control/protection: Injection    Comment: DEPO-PROVERA  Other Topics Concern   Not on file  Social History Narrative   Not on file   Social Determinants of Health   Financial Resource Strain: Not on file  Food Insecurity: No Food Insecurity (09/22/2021)   Hunger Vital Sign    Worried About Running Out of Food in the Last Year: Never true    Ran Out of Food in the Last Year: Never true  Transportation Needs: No Transportation Needs (06/14/2021)   PRAPARE - Hydrologist (Medical): No    Lack of Transportation (Non-Medical): No  Physical Activity: Not on file  Stress: Not on file  Social Connections: Not on file  Intimate Partner Violence: Not on file    Family History  Problem Relation Age of Onset   Varicose Veins Mother    Hypertension Father    Cancer Father        prostate    Past Surgical History:  Procedure Laterality Date   LIVER BIOPSY  2017   LUNG BIOPSY  2017    ROS: Review of  Systems Negative except as stated above  PHYSICAL EXAM: BP 120/80 (BP Location: Left Arm, Patient Position: Sitting, Cuff Size: Large)   Pulse 75   Temp 98.3 F (36.8 C)   Resp 16   Wt 197 lb (89.4 kg)   LMP 04/30/2022   SpO2 100%   BMI 29.09 kg/m   Physical Exam HENT:     Head: Normocephalic and atraumatic.  Eyes:     Extraocular Movements: Extraocular movements intact.     Conjunctiva/sclera: Conjunctivae normal.     Pupils: Pupils are equal, round, and reactive to light.  Cardiovascular:     Rate and Rhythm: Normal rate and regular rhythm.     Pulses: Normal pulses.     Heart sounds: Normal heart sounds.  Pulmonary:     Effort:  Pulmonary effort is normal.     Breath sounds: Normal breath sounds.  Musculoskeletal:     Cervical back: Normal range of motion and neck supple.  Neurological:     General: No focal deficit present.     Mental Status: She is alert and oriented to person, place, and time.  Psychiatric:        Mood and Affect: Mood normal.        Behavior: Behavior normal.     ASSESSMENT AND PLAN: 1. Primary hypertension - Blood pressure improved since last visit. - Continue Valsartan as prescribed. No refills needed as of present. - Counseled on blood pressure goal of less than 130/80, low-sodium, DASH diet, medication compliance, and 150 minutes of moderate intensity exercise per week as tolerated. Counseled on medication adherence and adverse effects. - Update BMP.  - Follow-up with primary provider in 3 months or sooner if needed.  - Basic Metabolic Panel  2. Screening cholesterol level - Recheck fasting cholesterol at next visit in 3 months. Patient aware to follow-up with me before then if needed.    Patient was given the opportunity to ask questions.  Patient verbalized understanding of the plan and was able to repeat key elements of the plan. Patient was given clear instructions to go to Emergency Department or return to medical center if symptoms don't improve, worsen, or new problems develop.The patient verbalized understanding.   Orders Placed This Encounter  Procedures   Basic Metabolic Panel    Return in about 3 months (around 08/24/2022) for Follow-Up or next available chronic care mgmt.  Camillia Herter, NP

## 2022-05-16 NOTE — Progress Notes (Signed)
[place] here for Depo-Provera Injection. Injection administered without complication. Patient will return in 3 months for next injection between 12/20 and 1/3. Next annual visit due November 2023.   Derinda Late, RN 05/16/2022  9:27 AM

## 2022-05-18 ENCOUNTER — Ambulatory Visit (INDEPENDENT_AMBULATORY_CARE_PROVIDER_SITE_OTHER): Payer: No Typology Code available for payment source | Admitting: Podiatry

## 2022-05-18 DIAGNOSIS — L03039 Cellulitis of unspecified toe: Secondary | ICD-10-CM | POA: Diagnosis not present

## 2022-05-18 MED ORDER — CEPHALEXIN 500 MG PO CAPS: 500.0000 mg | ORAL_CAPSULE | Freq: Three times a day (TID) | ORAL | 0 refills | Status: DC

## 2022-05-18 NOTE — Patient Instructions (Signed)

## 2022-05-21 NOTE — Progress Notes (Signed)
Subjective:   Patient ID: Janice Stewart, female   DOB: 36 y.o.   MRN: [id number]   HPI Chief Complaint  Patient presents with   Ingrown Toenail    Patient came in today for right hallux ingrown, started 3 months ago, patient had drainage, seen PCP and was given Mupirocin, a week ago, patient has cut and been cleaning the toenail    36 year old female presents for above concerns.  She states that she was wearing shoes that were tight causing irritation of the toe.  She that the nails tend to come off.  No pus.  No specific injury otherwise.   Review of Systems  All other systems reviewed and are negative.  Past Medical History:  Diagnosis Date   Cancer ([place])    Hypertension    Sickle cell trait ([place])    Stroke [facility])     Past Surgical History:  Procedure Laterality Date   LIVER BIOPSY  2017   LUNG BIOPSY  2017     Current Outpatient Medications:    cephALEXin (KEFLEX) 500 MG capsule, Take 1 capsule (500 mg total) by mouth 3 (three) times daily., Disp: 21 capsule, Rfl: 0   medroxyPROGESTERone (DEPO-PROVERA) 150 MG/ML injection, Inject 150 mg into the muscle every 3 (three) months., Disp: , Rfl:    Multiple Vitamins-Minerals (MULTIVITAMIN) tablet, Take 1 tablet by mouth daily., Disp: 30 tablet, Rfl: 2   mupirocin ointment (BACTROBAN) 2 %, Apply 1 Application topically 2 (two) times daily., Disp: 60 g, Rfl: 0   valsartan (DIOVAN) 40 MG tablet, Take 1 tablet (40 mg total) by mouth daily., Disp: 30 tablet, Rfl: 2  No Known Allergies         Objective:  Physical Exam  General: AAO x3, NAD  Dermatological: On the right hallux toenail the nail is somewhat loose with underlying nailbed distally.  There is no significant incurvation of the nail but it appears to have some loosening, granulation tissue present underneath the distal portion of the nail.  Localized edema and erythema without any ascending cellulitis.  No drainage or pus.  There is no fluctuation or  crepitation.  No malodor.  Vascular: Dorsalis Pedis artery and Posterior Tibial artery pedal pulses are 2/4 bilateral with immedate capillary fill time. There is no pain with calf compression, swelling, warmth, erythema.   Neruologic: Grossly intact via light touch bilateral.   Musculoskeletal: No gross boney pedal deformities bilateral. No pain, crepitus, or limitation noted with foot and ankle range of motion bilateral. Muscular strength 5/5 in all groups tested bilateral.  Gait: Unassisted, Nonantalgic.       Assessment:   Localized infection around toenail right side     Plan:  -Treatment options discussed including all alternatives, risks, and complications -Etiology of symptoms were discussed -We discussed both conservative as well as surgical options for this.  Ultimately discussed but held off on this today.  I was able to debride the nail back to the nail that was firmly adhered.  Recommend Epsom salt soaks daily and continue with the bacitracin antibiotic ointment dressing changes daily.  Prescribed Keflex. -No improvement will likely need to have the nail removed.  Trula Slade DPM

## 2022-05-24 ENCOUNTER — Ambulatory Visit (INDEPENDENT_AMBULATORY_CARE_PROVIDER_SITE_OTHER): Payer: No Typology Code available for payment source | Admitting: Family

## 2022-05-24 ENCOUNTER — Encounter: Payer: Self-pay | Admitting: Family

## 2022-05-24 VITALS — BP 120/80 | HR 75 | Temp 98.3°F | Resp 16 | Wt 197.0 lb

## 2022-05-24 DIAGNOSIS — I10 Essential (primary) hypertension: Secondary | ICD-10-CM | POA: Diagnosis not present

## 2022-05-24 DIAGNOSIS — Z13220 Encounter for screening for lipoid disorders: Secondary | ICD-10-CM | POA: Diagnosis not present

## 2022-05-25 LAB — BASIC METABOLIC PANEL
BUN/Creatinine Ratio: 7 — ABNORMAL LOW (ref 9–23)
BUN: 7 mg/dL (ref 6–20)
CO2: 21 mmol/L (ref 20–29)
Calcium: 9.4 mg/dL (ref 8.7–10.2)
Chloride: 106 mmol/L (ref 96–106)
Creatinine, Ser: 0.95 mg/dL (ref 0.57–1.00)
Glucose: 75 mg/dL (ref 70–99)
Potassium: 4.5 mmol/L (ref 3.5–5.2)
Sodium: 140 mmol/L (ref 134–144)
eGFR: 80 mL/min/{1.73_m2} (ref >59)

## 2022-06-01 ENCOUNTER — Ambulatory Visit: Payer: No Typology Code available for payment source | Admitting: Family

## 2022-06-04 ENCOUNTER — Ambulatory Visit: Payer: No Typology Code available for payment source | Admitting: Podiatry

## 2022-07-10 ENCOUNTER — Encounter: Payer: Self-pay | Admitting: Family Medicine

## 2022-07-10 ENCOUNTER — Ambulatory Visit (INDEPENDENT_AMBULATORY_CARE_PROVIDER_SITE_OTHER): Payer: No Typology Code available for payment source | Admitting: Family Medicine

## 2022-07-10 VITALS — BP 128/77 | HR 72 | Temp 97.9°F | Wt 203.4 lb

## 2022-07-10 DIAGNOSIS — R5382 Chronic fatigue, unspecified: Secondary | ICD-10-CM

## 2022-07-10 DIAGNOSIS — E559 Vitamin D deficiency, unspecified: Secondary | ICD-10-CM | POA: Diagnosis not present

## 2022-07-10 DIAGNOSIS — D573 Sickle-cell trait: Secondary | ICD-10-CM | POA: Diagnosis not present

## 2022-07-10 MED ORDER — FOLIC ACID 1 MG PO TABS: 1.0000 mg | ORAL_TABLET | Freq: Every day | ORAL | 11 refills | Status: AC

## 2022-07-10 NOTE — Progress Notes (Signed)
Subjective   Patient ID: Janice Stewart, female    DOB: Mar 16, 1986  Age: 36 y.o. MRN: [id number]  Chief Complaint  Patient presents with   Consult    Referred by PCP    Janice Stewart is a very pleasant 36 year old female with a medical history significant for obesity, sickle cell trait, gestational choriocarcinoma, and history of CVA presents with a primary complaint of sickle cell trait. Patient was referred to clinic by her PCP for further evaluation of sickle cell trait.  Patient has been experiencing increased low back pain for well over a year.  Also, she has a history of cerebrovascular accident.  The concern is the fact that patient is experiencing symptoms that are consistent with sickle cell anemia despite having sickle cell trait.  Patient also has a history of bone infarcts.  She has not been evaluated by hematologist.  Janice Stewart patient's hemoglobin fractionation confirms sickle cell trait. Today, patient endorses low back pain, fatigue, and recent weight gain.  She denies any constipation, intolerance to heat or cold, hair loss, or depression.    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 01/25/2020   History of CVA (cerebrovascular accident) 01/25/2020   Past Medical History:  Diagnosis Date   Cancer ([place])    Hypertension    Sickle cell trait ([place])    Stroke [facility])    Past Surgical History:  Procedure Laterality Date   LIVER BIOPSY  2017   LUNG BIOPSY  2017   Social History   Tobacco Use   Smoking status: Never    Passive exposure: Never   Smokeless tobacco: Never  Vaping Use   Vaping Use: Never used  Substance Use Topics   Alcohol use: Not Currently   Drug use: Never   Social History   Socioeconomic History   Marital status:  Single    Spouse name: Not on file   Number of children: Not on file   Years of education: Not on file   Highest education level: Not on file  Occupational History   Not on file  Tobacco Use   Smoking status: Never    Passive exposure: Never   Smokeless tobacco: Never  Vaping Use   Vaping Use: Never used  Substance and Sexual Activity   Alcohol use: Not Currently   Drug use: Never   Sexual activity: Yes    Birth control/protection: Injection    Comment: DEPO-PROVERA  Other Topics Concern   Not on file  Social History Narrative   Not on file   Social Determinants of Health   Financial Resource Strain: Not on file  Food Insecurity: No Food Insecurity (09/22/2021)   Hunger Vital Sign    Worried About Running Out of Food in the Last Year: Never true    Ran Out of Food in the Last Year: Never true  Transportation Needs: No Transportation Needs (06/14/2021)   PRAPARE - Hydrologist (Medical): No    Lack of Transportation (Non-Medical): No  Physical Activity: Not on file  Stress: Not on file  Social Connections: Not on file  Intimate Partner Violence: Not on file   Family Status  Relation Name Status   Mother  Alive   Father  Alive   Family History  Problem Relation Age of Onset   Varicose Veins Mother    Hypertension Father    Cancer Father        prostate   No Known Allergies    Review of Systems  Constitutional:  Positive for malaise/fatigue. Negative for chills, fever and weight loss.  HENT: Negative.    Eyes: Negative.   Respiratory: Negative.    Gastrointestinal: Negative.   Genitourinary: Negative.   Musculoskeletal: Negative.   Skin: Negative.   Neurological: Negative.   Psychiatric/Behavioral: Negative.        Objective:     BP 128/77   Pulse 72   Temp 97.9 F (36.6 C)   Wt 203 lb 6.4 oz (92.3 kg)   SpO2 100%   BMI 30.04 kg/m  BP Readings from Last 3 Encounters:  07/10/22 128/77  05/24/22 120/80  05/16/22  (!) 132/95   Wt Readings from Last 3 Encounters:  07/10/22 203 lb 6.4 oz (92.3 kg)  05/24/22 197 lb (89.4 kg)  05/16/22 199 lb (90.3 kg)      Physical Exam Constitutional:      Appearance: She is obese.  Eyes:     Pupils: Pupils are equal, round, and reactive to light.  Cardiovascular:     Rate and Rhythm: Normal rate and regular rhythm.     Pulses: Normal pulses.  Pulmonary:     Effort: Pulmonary effort is normal.  Abdominal:     General: Abdomen is flat. Bowel sounds are normal.  Skin:    General: Skin is warm.  Neurological:     General: No focal deficit present.     Mental Status: She is alert. Mental status is at baseline.  Psychiatric:        Mood and Affect: Mood normal.        Behavior: Behavior normal.        Thought Content: Thought content normal.        Judgment: Judgment normal.      No results found for any visits on 07/10/22.  Last CBC Lab Results  Component Value Date   WBC 5.6 05/10/2022   HGB 11.8 05/10/2022   HCT 35.6 05/10/2022   MCV 86 05/10/2022   MCH 28.6 05/10/2022   RDW 13.3 05/10/2022   PLT 249 54/04/8118   Last metabolic panel Lab Results  Component Value Date   GLUCOSE 75 05/24/2022   NA 140 05/24/2022   K 4.5 05/24/2022   CL 106 05/24/2022   CO2 21 05/24/2022   BUN 7 05/24/2022   CREATININE 0.95 05/24/2022   EGFR 80 05/24/2022   CALCIUM 9.4 05/24/2022   PROT 7.6 05/10/2022   ALBUMIN 4.8 05/10/2022   LABGLOB 2.8 05/10/2022   AGRATIO 1.7 05/10/2022   BILITOT 0.3 05/10/2022   ALKPHOS 64 05/10/2022   AST 11 05/10/2022   ALT 9 05/10/2022   ANIONGAP 9 07/23/2020   Last lipids Lab Results  Component Value Date   CHOL 191 05/10/2022   HDL 51 05/10/2022   LDLCALC 129 (H) 05/10/2022   TRIG 61 05/10/2022   CHOLHDL 3.7 05/10/2022   Last hemoglobin A1c Lab Results  Component Value Date   HGBA1C 5.1 05/10/2022   Last thyroid functions Lab Results  Component Value Date   TSH 1.210 05/10/2022   Last vitamin D No  results found for: "25OHVITD2", "25OHVITD3", "VD25OH" Last vitamin B12 and Folate No results found for: "VITAMINB12", "FOLATE"    The ASCVD Risk score (Arnett  DK, et al., 2019) failed to calculate for the following reasons:   The 2019 ASCVD risk score is only valid for ages 21 to 25    Assessment & Plan:   Problem List Items Addressed This Visit   None Visit Diagnoses     Vitamin D deficiency    -  Primary   Chronic fatigue       Relevant Medications   folic acid (FOLVITE) 1 MG tablet   Other Relevant Orders   Vitamin B12   CBC with Differential   Sickle cell trait syndrome (HCC)       Relevant Medications   folic acid (FOLVITE) 1 MG tablet   Other Relevant Orders   Ambulatory referral to Hematology / Oncology      1. Vitamin D deficiency Repeat vitamin D level in 3 months  2. Chronic fatigue - Vitamin B12 - CBC with Differential - folic acid (FOLVITE) 1 MG tablet; Take 1 tablet (1 mg total) by mouth daily.  Dispense: 30 tablet; Refill: 11  3. Sickle cell trait syndrome ([place]) At this juncture, patient warrants referral to hematology for further workup and evaluation. Discussed sickle cell trait versus sickle cell anemia at length, patient expressed understanding. - folic acid (FOLVITE) 1 MG tablet; Take 1 tablet (1 mg total) by mouth daily.  Dispense: 30 tablet; Refill: 11 - Ambulatory referral to Hematology / Oncology  Patient advised to continue to follow-up with her PCP as scheduled  Donia Pounds  APRN, MSN, FNP-C Patient [place] [address] [place], [place] [address] [phone]

## 2022-07-10 NOTE — Patient Instructions (Signed)
I have sent a referral to [facility] hematology for further workup of your sickle cell trait. I would like for you to start folic acid daily. I will follow-up with you by phone with any abnormal lab results. I recommend 5 small meals throughout the day.  Decrease your sugar intake in order to decrease your weight.  Exercise around 150 minutes/week.  Also, increase your water intake to 64 ounces per day.   It was a pleasure meeting you

## 2022-07-11 LAB — CBC WITH DIFFERENTIAL/PLATELET
Basophils Absolute: 0 10*3/uL (ref 0.0–0.2)
Basos: 1 %
EOS (ABSOLUTE): 0.2 10*3/uL (ref 0.0–0.4)
Eos: 2 %
Hematocrit: 34.9 % (ref 34.0–46.6)
Hemoglobin: 11.6 g/dL (ref 11.1–15.9)
Immature Grans (Abs): 0 10*3/uL (ref 0.0–0.1)
Immature Granulocytes: 0 %
Lymphocytes Absolute: 2.4 10*3/uL (ref 0.7–3.1)
Lymphs: 34 %
MCH: 28.5 pg (ref 26.6–33.0)
MCHC: 33.2 g/dL (ref 31.5–35.7)
MCV: 86 fL (ref 79–97)
Monocytes Absolute: 0.4 10*3/uL (ref 0.1–0.9)
Monocytes: 6 %
Neutrophils Absolute: 4 10*3/uL (ref 1.4–7.0)
Neutrophils: 57 %
Platelets: 249 10*3/uL (ref 150–450)
RBC: 4.07 x10E6/uL (ref 3.77–5.28)
RDW: 13.4 % (ref 11.7–15.4)
WBC: 7.1 10*3/uL (ref 3.4–10.8)

## 2022-07-11 LAB — VITAMIN B12: Vitamin B-12: 363 pg/mL (ref 232–1245)

## 2022-07-18 NOTE — Progress Notes (Signed)
Patient's laboratory values were within normal range.  Patient was referred to [facility] hematology for further workup and evaluation of her sickle cell trait.  Sickle cell trait typically does not present in the same fashion as sickle cell disease.  However, patient has had symptoms that are similar to sickle cell disease.  Recommend that patient continues daily folic acid and increase hydration to around 64 ounces per day.  Patient should follow-up with her PCP as scheduled. Donia Pounds  APRN, MSN, FNP-C Patient [place] [address] [place], [place] [address] [phone]

## 2022-08-01 ENCOUNTER — Other Ambulatory Visit: Payer: Self-pay

## 2022-08-01 ENCOUNTER — Ambulatory Visit (INDEPENDENT_AMBULATORY_CARE_PROVIDER_SITE_OTHER): Payer: No Typology Code available for payment source | Admitting: Certified Nurse Midwife

## 2022-08-01 ENCOUNTER — Other Ambulatory Visit: Payer: Self-pay | Admitting: Family

## 2022-08-01 ENCOUNTER — Encounter: Payer: Self-pay | Admitting: Certified Nurse Midwife

## 2022-08-01 ENCOUNTER — Other Ambulatory Visit (HOSPITAL_COMMUNITY)
Admission: RE | Admit: 2022-08-01 | Discharge: 2022-08-01 | Disposition: A | Discharge status: Home / Self Care | Payer: No Typology Code available for payment source | Source: Ambulatory Visit | Attending: Certified Nurse Midwife | Admitting: Certified Nurse Midwife

## 2022-08-01 VITALS — BP 133/88 | HR 77 | Wt 204.6 lb

## 2022-08-01 DIAGNOSIS — N898 Other specified noninflammatory disorders of vagina: Secondary | ICD-10-CM

## 2022-08-01 DIAGNOSIS — R6882 Decreased libido: Secondary | ICD-10-CM

## 2022-08-01 DIAGNOSIS — I10 Essential (primary) hypertension: Secondary | ICD-10-CM

## 2022-08-01 DIAGNOSIS — R635 Abnormal weight gain: Secondary | ICD-10-CM

## 2022-08-01 DIAGNOSIS — L659 Nonscarring hair loss, unspecified: Secondary | ICD-10-CM

## 2022-08-01 DIAGNOSIS — Z01419 Encounter for gynecological examination (general) (routine) without abnormal findings: Secondary | ICD-10-CM | POA: Diagnosis not present

## 2022-08-01 DIAGNOSIS — Z3042 Encounter for surveillance of injectable contraceptive: Secondary | ICD-10-CM | POA: Diagnosis not present

## 2022-08-01 MED ORDER — FLUCONAZOLE 150 MG PO TABS: 150.0000 mg | ORAL_TABLET | Freq: Once | ORAL | 0 refills | Status: AC

## 2022-08-01 MED ORDER — MEDROXYPROGESTERONE ACETATE 150 MG/ML IM SUSP
150.0000 mg | Freq: Once | INTRAMUSCULAR | Status: AC
  Administered 2022-08-01: 150 mg via INTRAMUSCULAR

## 2022-08-01 NOTE — Progress Notes (Signed)
ANNUAL EXAM Patient name: Janice Stewart MRN [id number]  Date of birth: Feb 28, 1986 Chief Complaint:   Gynecologic Exam  History of Present Illness:   Janice Stewart is a 36 y.o. [phone] Hispanic female being seen today for a routine annual exam.  Current complaints: Absent libido, hair loss, weight gain and night sweats - libido was low after she had her daughter but is now non-existent and is very upsetting to her.  No LMP recorded. Patient has had an injection.   Upstream - 08/01/22 2058       Pregnancy Intention Screening   Does the patient want to become pregnant in the next year? No    Does the patient's partner want to become pregnant in the next year? No    Would the patient like to discuss contraceptive options today? N/A      Contraception Wrap Up   Current Method Hormonal Injection    End Method Hormonal Injection    Contraception Counseling Provided No    How was the end contraceptive method provided? Provided on site            The pregnancy intention screening data noted above was reviewed. Potential methods of contraception were discussed. The patient elected to proceed with Hormonal Injection.   Last pap 06/26/21. Results were: NILM w/ HRHPV negative. H/O abnormal pap: no Last mammogram: Never (age). Results were: N/A. Family h/o breast cancer: no Last colonoscopy: Never (age). Results were: N/A. Family h/o colorectal cancer: no     08/01/2022   11:01 AM 05/24/2022    8:25 AM 05/10/2022    8:21 AM 09/22/2021    9:21 AM 09/05/2021    1:41 PM  Depression screen PHQ 2/9  Decreased Interest 1 0 0 0 0  Down, Depressed, Hopeless 0 0 0 0 0  PHQ - 2 Score 1 0 0 0 0  Altered sleeping 0 0 0    Tired, decreased energy 0 0 0    Change in appetite 1 0 0    Feeling bad or failure about yourself  0 0 0    Trouble concentrating 0 0 0    Moving slowly or fidgety/restless 0 0 0    Suicidal thoughts 0 0 0    PHQ-9 Score 2 0 0          08/01/2022    11:01 AM 05/10/2022    8:21 AM 06/26/2021    4:29 PM 06/14/2021    9:58 AM  GAD 7 : Generalized Anxiety Score  Nervous, Anxious, on Edge 0 0 0 0  Control/stop worrying 0 0 0 0  Worry too much - different things 0 0 0 0  Trouble relaxing 1 0 0 0  Restless 0 0 0 0  Easily annoyed or irritable 0 0 0 0  Afraid - awful might happen 0 0 0 0  Total GAD 7 Score 1 0 0 0     Review of Systems:   Pertinent items are noted in HPI Denies any headaches, blurred vision, fatigue, shortness of breath, chest pain, abdominal pain, abnormal vaginal discharge/itching/odor/irritation, problems with periods, bowel movements, urination, or intercourse unless otherwise stated above. Pertinent History Reviewed:  Reviewed past medical,surgical, social and family history.  Reviewed problem list, medications and allergies. Physical Assessment:   Vitals:   08/01/22 1045 08/01/22 1136  BP: (!) 148/93 133/88  Pulse: 78 77  Weight: 204 lb 9.6 oz (92.8 kg)    Body mass  index is 30.21 kg/m.   Physical Examination:  General appearance - well appearing, and in no distress Mental status - alert, oriented to person, place, and time Psych:  She has a normal mood and affect Skin - warm and dry, normal color, no suspicious lesions noted Chest - effort normal Heart - normal rate and regular rhythm Neck:  midline trachea, no thyromegaly or nodules Breasts - breasts appear normal Abdomen - soft, nontender Pelvic - not indicated Extremities:  No swelling or varicosities noted  Chaperone present for exam  No results found for this or any previous visit (from the past 24 hour(s)).  Assessment & Plan:  1. Encounter for annual routine gynecological examination - Pap due in 2025  2. Vagina itching - Pt notes itching/discharge just like previous yeast infections - fluconazole (DIFLUCAN) 150 MG tablet; Take 1 tablet (150 mg total) by mouth once for 1 dose.  Dispense: 1 tablet; Refill: 0 - Cervicovaginal ancillary  only( [place])  3. Low libido - Thyroid Panel With TSH - Vitamin D (25 hydroxy)  4. Weight gain - Thyroid Panel With TSH - Vitamin D (25 hydroxy)  5. Hair loss - Thyroid Panel With TSH - Vitamin D (25 hydroxy)  6. Encounter for surveillance of injectable contraceptive - Explained that much of her symptoms could be related to Depo, but she does not like any other birth control option.  - medroxyPROGESTERone (DEPO-PROVERA) injection 150 mg  7. Chronic hypertension - on Valsartan but has not taken it today, this is managed by her PCP  Mammogram: @ 36yo, or sooner if problems Colonoscopy: @ 36yo, or sooner if problems  Orders Placed This Encounter  Procedures   Thyroid Panel With TSH   Vitamin D (25 hydroxy)   Meds:  Meds ordered this encounter  Medications   fluconazole (DIFLUCAN) 150 MG tablet    Sig: Take 1 tablet (150 mg total) by mouth once for 1 dose.    Dispense:  1 tablet    Refill:  0   medroxyPROGESTERone (DEPO-PROVERA) injection 150 mg   Follow-up: Return in about 1 year (around 08/02/2023), or if symptoms worsen or fail to improve.  Gaylan Gerold, CNM, MSN, [place] Certified Nurse Midwife, [facility]

## 2022-08-02 LAB — CERVICOVAGINAL ANCILLARY ONLY
Candida Glabrata: NEGATIVE
Candida Vaginitis: POSITIVE — AB
Chlamydia: NEGATIVE
Comment: NEGATIVE
Comment: NEGATIVE
Comment: NEGATIVE
Comment: NEGATIVE
Comment: NORMAL
Neisseria Gonorrhea: NEGATIVE
Trichomonas: NEGATIVE

## 2022-08-02 LAB — THYROID PANEL WITH TSH
Free Thyroxine Index: 1.9 (ref 1.2–4.9)
T3 Uptake Ratio: 25 % (ref 24–39)
T4, Total: 7.4 ug/dL (ref 4.5–12.0)
TSH: 1.47 u[IU]/mL (ref 0.450–4.500)

## 2022-08-02 LAB — VITAMIN D 25 HYDROXY (VIT D DEFICIENCY, FRACTURES): Vit D, 25-Hydroxy: 31.4 ng/mL (ref 30.0–100.0)

## 2022-08-09 NOTE — Progress Notes (Signed)
Patient ID: Janice Stewart, female    DOB: 25-Jan-1986  MRN: [id number]  CC: Chronic Care Management   Subjective: Janice Stewart Must [place] is a 36 y.o. female who presents for chronic care management.   Her concerns today include:  - Doing well on blood pressure medication, no issues/concerns. Denies red flag symptoms.  - Established with Gynecology. She is taking Ashwagandha as recommended but does not feel libido improved. Discussed with patient to consult back with gynecologist for next steps. Patient agreeable.  - Hair still shedding. Feeling sensitive sensation of scalp. Has an appointment with dermatologist 01/24/2023. Would like to try referral to Lois Huxley, MD as well.  - Appointment with Hematology not until 04/11/2023.  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     Cholecalciferol (VITAMIN D3) 50 MCG (2000 UT) CHEW Chew 50 mcg by mouth daily.     folic acid (FOLVITE) 1 MG tablet Take 1 tablet (1 mg total) by mouth daily. 30 tablet 11   medroxyPROGESTERone (DEPO-PROVERA) 150 MG/ML injection Inject 150 mg into the muscle every 3 (three) months.     No current facility-administered medications on file prior to visit.    No Known Allergies  Social History   Socioeconomic History   Marital status: Single    Spouse name: Not on file   Number of children: Not on file   Years of education: Not on file   Highest education level: Not on file  Occupational History   Not on file  Tobacco Use   Smoking status: Never    Passive exposure: Never   Smokeless tobacco: Never  Vaping Use   Vaping Use:  Never used  Substance and Sexual Activity   Alcohol use: Not Currently   Drug use: Never   Sexual activity: Yes    Birth control/protection: Injection    Comment: DEPO-PROVERA  Other Topics Concern   Not on file  Social History Narrative   Not on file   Social Determinants of Health   Financial Resource Strain: Not on file  Food Insecurity: No Food Insecurity (08/01/2022)   Hunger Vital Sign    Worried About Running Out of Food in the Last Year: Never true    Ran Out of Food in the Last Year: Never true  Transportation Needs: No Transportation Needs (08/01/2022)   PRAPARE - Hydrologist (Medical): No    Lack of Transportation (Non-Medical): No  Physical Activity: Not on file  Stress: Not on file  Social Connections: Not on file  Intimate Partner Violence: Not on file    Family History  Problem Relation Age of Onset   Varicose Veins Mother    Hypertension Father    Cancer Father        prostate    Past Surgical History:  Procedure Laterality Date   LIVER BIOPSY  2017   LUNG BIOPSY  2017    ROS: Review of Systems Negative except as stated above  PHYSICAL EXAM: BP 129/80   Pulse 83   Temp 98.3 F (36.8 C)  Resp 16   Ht 5' 9.02" (1.753 m)   Wt 198 lb (89.8 kg)   SpO2 97%   BMI 29.23 kg/m    Physical Exam HENT:     Head: Normocephalic and atraumatic.     Comments: Scalp appears normal. Eyes:     Extraocular Movements: Extraocular movements intact.     Conjunctiva/sclera: Conjunctivae normal.     Pupils: Pupils are equal, round, and reactive to light.  Cardiovascular:     Rate and Rhythm: Normal rate and regular rhythm.     Pulses: Normal pulses.     Heart sounds: Normal heart sounds.  Pulmonary:     Effort: Pulmonary effort is normal.     Breath sounds: Normal breath sounds.  Musculoskeletal:     Cervical back: Normal range of motion and neck supple.  Neurological:     General: No focal deficit present.     Mental  Status: She is alert and oriented to person, place, and time.  Psychiatric:        Mood and Affect: Mood normal.        Behavior: Behavior normal.     ASSESSMENT AND PLAN: 1. Primary hypertension - Continue Valsartan as prescribed.  - Counseled on blood pressure goal of less than 130/80, low-sodium, DASH diet, medication compliance, and 150 minutes of moderate intensity exercise per week as tolerated. Counseled on medication adherence and adverse effects. - Follow-up with primary provider in 3 months or sooner if needed.  - valsartan (DIOVAN) 40 MG tablet; Take 1 tablet (40 mg total) by mouth daily.  Dispense: 30 tablet; Refill: 2  2. Screening cholesterol level - Routine screening.  - Lipid panel  3. Hair loss - Per patient request referral to dermatologist Lois Huxley, MD for evaluation/management.  - Patient has an appointment on 01/24/2023 with Sarina Ser, MD. Discussed with patient to keep this appointment should today's referral appointment be further out than 01/24/2023. Patient agreeable.  - Ambulatory referral to Dermatology   Patient was given the opportunity to ask questions.  Patient verbalized understanding of the plan and was able to repeat key elements of the plan. Patient was given clear instructions to go to Emergency Department or return to medical center if symptoms don't improve, worsen, or new problems develop.The patient verbalized understanding.   Orders Placed This Encounter  Procedures   Lipid panel   Ambulatory referral to Dermatology     Requested Prescriptions   Signed Prescriptions Disp Refills   valsartan (DIOVAN) 40 MG tablet 30 tablet 2    Sig: Take 1 tablet (40 mg total) by mouth daily.    Return in about 3 months (around 11/14/2022) for Follow-Up or next available chronic care mgmt .  Camillia Herter, NP

## 2022-08-14 ENCOUNTER — Ambulatory Visit: Payer: Self-pay | Admitting: Family Medicine

## 2022-08-15 ENCOUNTER — Encounter: Payer: Self-pay | Admitting: Family

## 2022-08-15 ENCOUNTER — Ambulatory Visit (INDEPENDENT_AMBULATORY_CARE_PROVIDER_SITE_OTHER): Payer: No Typology Code available for payment source | Admitting: Family

## 2022-08-15 VITALS — BP 129/80 | HR 83 | Temp 98.3°F | Resp 16 | Ht 69.02 in | Wt 198.0 lb

## 2022-08-15 DIAGNOSIS — Z13220 Encounter for screening for lipoid disorders: Secondary | ICD-10-CM | POA: Diagnosis not present

## 2022-08-15 DIAGNOSIS — I10 Essential (primary) hypertension: Secondary | ICD-10-CM

## 2022-08-15 DIAGNOSIS — L659 Nonscarring hair loss, unspecified: Secondary | ICD-10-CM

## 2022-08-15 MED ORDER — VALSARTAN 40 MG PO TABS: 40.0000 mg | ORAL_TABLET | Freq: Every day | ORAL | 2 refills | Status: DC

## 2022-08-15 NOTE — Progress Notes (Signed)
.  Pt presents for chronic care management

## 2022-08-16 LAB — LIPID PANEL
Chol/HDL Ratio: 3.9 ratio (ref 0.0–4.4)
Cholesterol, Total: 195 mg/dL (ref 100–199)
HDL: 50 mg/dL (ref >39)
LDL Chol Calc (NIH): 132 mg/dL — ABNORMAL HIGH (ref 0–99)
Triglycerides: 73 mg/dL (ref 0–149)
VLDL Cholesterol Cal: 13 mg/dL (ref 5–40)

## 2022-10-22 ENCOUNTER — Other Ambulatory Visit: Payer: Self-pay

## 2022-10-22 ENCOUNTER — Encounter: Payer: No Typology Code available for payment source | Admitting: Obstetrics and Gynecology

## 2022-10-22 ENCOUNTER — Ambulatory Visit (INDEPENDENT_AMBULATORY_CARE_PROVIDER_SITE_OTHER): Payer: Medicaid Other | Admitting: Obstetrics and Gynecology

## 2022-10-22 ENCOUNTER — Encounter: Payer: Self-pay | Admitting: Obstetrics and Gynecology

## 2022-10-22 VITALS — BP 135/86 | HR 71 | Wt 203.1 lb

## 2022-10-22 DIAGNOSIS — R6882 Decreased libido: Secondary | ICD-10-CM

## 2022-10-22 DIAGNOSIS — N921 Excessive and frequent menstruation with irregular cycle: Secondary | ICD-10-CM | POA: Diagnosis not present

## 2022-10-22 DIAGNOSIS — Z30430 Encounter for insertion of intrauterine contraceptive device: Secondary | ICD-10-CM

## 2022-10-22 LAB — POCT PREGNANCY, URINE: Preg Test, Ur: NEGATIVE

## 2022-10-22 MED ORDER — PARAGARD INTRAUTERINE COPPER IU IUD
1.0000 | INTRAUTERINE_SYSTEM | Freq: Once | INTRAUTERINE | Status: AC
  Administered 2022-10-22: 1 via INTRAUTERINE

## 2022-10-22 NOTE — Progress Notes (Signed)
    Hx:  Put on depo after birth, had bleeding, went back to pills, still having bleeding, then depo again. Decreaed but now has had bleeding x 3 weeks and would like IUD. Would like to have monthly cycles  and avoid further episodes of irregular bleeding. Also notes decreased sexual desire which was not the case previously.   IUD Insertion Procedure Note Patient identified, informed consent performed, consent signed.   Discussed risks of irregular bleeding, cramping, infection, malpositioning or misplacement of the IUD outside the uterus which may require further procedure such as laparoscopy. Also discussed >99% contraception efficacy, increased risk of ectopic pregnancy with failure of method.  Time out was performed.  Urine pregnancy test negative.  Speculum placed in the vagina.  Cervix visualized.  Cleaned with Betadine x 2.  Anterior cervix infiltrated with 0.5cc of 1% lidocaine. Grasped anteriorly with a single tooth tenaculum.  Paracervical block was administered and the endocervical canal instilled with using the syringe.  Paragard IUD placed per manufacturer's recommendations.  Strings trimmed to 3 cm. Tenaculum was removed, good hemostasis noted.  Patient tolerated procedure well.   Patient was given post-procedure instructions.  She was advised to have backup contraception for one week.  Patient was also asked to check IUD strings periodically and follow up prn for IUD check.   1. Encounter for IUD insertion Now s/p uncomplicated IUD insertion. After discussion of different bleeding expectations, patient elected for Cu-IUD as she would like to have a regular monthly menses and avoid hormones given complex medical hx (gestational choriocarcinoma, CVA, sickle cell).   2. Low libido May be due to depo provera and depo washout can take several months. Recommend follow up symptoms in a few months. If no improvement, can consider medical management to increase sexual desire  3. Breakthrough  bleeding Breakthrough on depo, discussed bleeding likely to be irregular initially as depo washes out and uterus adjusts to Cu-IUD

## 2022-12-01 ENCOUNTER — Other Ambulatory Visit: Payer: Self-pay | Admitting: Family

## 2022-12-01 DIAGNOSIS — I10 Essential (primary) hypertension: Secondary | ICD-10-CM

## 2022-12-03 NOTE — Telephone Encounter (Signed)
Requested medication (s) are due for refill today: Yes  Requested medication (s) are on the active medication list: Yes  Last refill:  08/15/22  Future visit scheduled: No  Notes to clinic:  Prescription has expired.    Requested Prescriptions  Pending Prescriptions Disp Refills   valsartan (DIOVAN) 40 MG tablet [Pharmacy Med Name: Valsartan 40 MG Oral Tablet] 30 tablet 0    Sig: Take 1 tablet by mouth once daily     Cardiovascular:  Angiotensin Receptor Blockers Failed - 12/01/2022  9:16 AM      Failed - Cr in normal range and within 180 days    Creatinine, Ser  Date Value Ref Range Status  05/24/2022 0.95 0.57 - 1.00 mg/dL Final         Failed - K in normal range and within 180 days    Potassium  Date Value Ref Range Status  05/24/2022 4.5 3.5 - 5.2 mmol/L Final         Passed - Patient is not pregnant      Passed - Last BP in normal range    BP Readings from Last 1 Encounters:  10/22/22 135/86         Passed - Valid encounter within last 6 months    Recent Outpatient Visits           3 months ago Primary hypertension   [facility], [place], NP   6 months ago Primary hypertension   [facility], [place], NP   6 months ago Annual physical exam   [facility], Amy J, NP   1 year ago Essential hypertension   [facility], [place], NP   1 year ago Essential hypertension   [facility], [place], NP       Future Appointments             In 1 month Deirdre Evener, MD [facility]

## 2023-01-01 ENCOUNTER — Other Ambulatory Visit: Payer: Self-pay | Admitting: Family

## 2023-01-01 DIAGNOSIS — I10 Essential (primary) hypertension: Secondary | ICD-10-CM

## 2023-01-02 NOTE — Telephone Encounter (Signed)
Requested medication (s) are due for refill today: yes  Requested medication (s) are on the active medication list: yes    Last refill: 12/03/22  #30  0 refills  Future visit scheduled no  Notes to clinic:Failed due to labs, please review. Thank you.  Requested Prescriptions  Pending Prescriptions Disp Refills   valsartan (DIOVAN) 40 MG tablet [Pharmacy Med Name: Valsartan 40 MG Oral Tablet] 30 tablet 0    Sig: Take 1 tablet by mouth once daily     Cardiovascular:  Angiotensin Receptor Blockers Failed - 01/01/2023  2:12 PM      Failed - Cr in normal range and within 180 days    Creatinine, Ser  Date Value Ref Range Status  05/24/2022 0.95 0.57 - 1.00 mg/dL Final         Failed - K in normal range and within 180 days    Potassium  Date Value Ref Range Status  05/24/2022 4.5 3.5 - 5.2 mmol/L Final         Passed - Patient is not pregnant      Passed - Last BP in normal range    BP Readings from Last 1 Encounters:  10/22/22 135/86         Passed - Valid encounter within last 6 months    Recent Outpatient Visits           4 months ago Primary hypertension   [facility] [facility], [place], [place]   7 months ago Primary hypertension   [place] [facility], [place], [place]   7 months ago Annual physical exam   [facility], Amy J, NP   1 year ago Essential hypertension   [place] [facility], [place], [place]   1 year ago Essential hypertension   [place] [facility], Salomon Fick, NP       Future Appointments             In 3 weeks Deirdre Evener, MD [facility]

## 2023-01-08 ENCOUNTER — Encounter: Payer: Self-pay | Admitting: Obstetrics and Gynecology

## 2023-01-08 ENCOUNTER — Other Ambulatory Visit: Payer: Self-pay

## 2023-01-08 ENCOUNTER — Ambulatory Visit: Payer: Medicaid Other | Admitting: Obstetrics and Gynecology

## 2023-01-08 VITALS — BP 134/95 | HR 67 | Wt 199.4 lb

## 2023-01-08 DIAGNOSIS — M6208 Separation of muscle (nontraumatic), other site: Secondary | ICD-10-CM

## 2023-01-08 DIAGNOSIS — Z975 Presence of (intrauterine) contraceptive device: Secondary | ICD-10-CM

## 2023-01-08 DIAGNOSIS — R6882 Decreased libido: Secondary | ICD-10-CM | POA: Diagnosis not present

## 2023-01-08 NOTE — Progress Notes (Signed)
GYNECOLOGY VISIT  Patient name: Janice Stewart MRN [id number]  Date of birth: [birth year] Chief Complaint:   Contraception  History:  Janice Stewart is a 37 y.o. [phone] being seen today for IUD follow up and having a regular monthly cycle, which she prefers. Pain with intercourse is improving.   No longer having BT. Desire for intercourse has improved No pain with intercourse or vaginal dryness Still having hair falling out - has appt with derm in about 2 weeks Previously told she has diastasis recti and working with PP healing coach and was told to measure it so she can keep track - notes having some abdominal discomfort Would be open to more children in the future  Past Medical History:  Diagnosis Date   Cancer (HCC)    Hypertension    Sickle cell trait (HCC)    Stroke [facility])     Past Surgical History:  Procedure Laterality Date   LIVER BIOPSY  2017   LUNG BIOPSY  2017    The following portions of the patient's history were reviewed and updated as appropriate: allergies, current medications, past family history, past medical history, past social history, past surgical history and problem list.   Health Maintenance:   Last pap     Component Value Date/Time   DIAGPAP  06/26/2021 1646    - Negative for intraepithelial lesion or malignancy (NILM)   DIAGPAP  01/25/2020 1416    - Negative for intraepithelial lesion or malignancy (NILM)   HPVHIGH Negative 06/26/2021 1646   HPVHIGH Negative 01/25/2020 1416   ADEQPAP  06/26/2021 1646    Satisfactory for evaluation; transformation zone component PRESENT.   ADEQPAP  01/25/2020 1416    Satisfactory for evaluation; transformation zone component PRESENT.    High Risk HPV: Positive  Adequacy:  Satisfactory for evaluation, transformation zone component PRESENT  Diagnosis:  Atypical squamous cells of undetermined significance (ASC-US)    Review of Systems:  Pertinent items are noted in HPI. Comprehensive  review of systems was otherwise negative.   Objective:  Physical Exam BP (!) 137/97   Pulse 65   Wt 199 lb 6.4 oz (90.4 kg)   BMI 29.43 kg/m    Physical Exam Vitals and nursing note reviewed.  Constitutional:      Appearance: Normal appearance.  HENT:     Head: Normocephalic and atraumatic.  Pulmonary:     Effort: Pulmonary effort is normal.  Abdominal:     Palpations: Abdomen is soft.     Tenderness: There is no abdominal tenderness.  Skin:    General: Skin is warm and dry.  Neurological:     General: No focal deficit present.     Mental Status: She is alert.  Psychiatric:        Mood and Affect: Mood normal.        Behavior: Behavior normal.        Thought Content: Thought content normal.        Judgment: Judgment normal.       Assessment & Plan:   1. IUD (intrauterine device) in place Doing well with IUD in place, having regular monthly cycles and cramping is tolerable.   2. Low libido Improving at this time. Will continue to monitor and if worsens or plateaus can consider additional medical therapy  3. Diastasis recti Referral to PFPT for further evaluation and management.  - Ambulatory referral to Physical Therapy   Lorriane Shire, MD Minimally Invasive Gynecologic Surgery  Center for [employer], [facility]

## 2023-01-14 ENCOUNTER — Other Ambulatory Visit: Payer: Self-pay

## 2023-01-14 DIAGNOSIS — I10 Essential (primary) hypertension: Secondary | ICD-10-CM

## 2023-01-14 MED ORDER — VALSARTAN 40 MG PO TABS: 40.0000 mg | ORAL_TABLET | Freq: Every day | ORAL | 0 refills | Status: DC

## 2023-01-24 ENCOUNTER — Ambulatory Visit: Payer: Medicaid Other | Admitting: Dermatology

## 2023-01-24 VITALS — BP 134/92 | HR 86 | Wt 199.0 lb

## 2023-01-24 DIAGNOSIS — L650 Telogen effluvium: Secondary | ICD-10-CM

## 2023-01-24 DIAGNOSIS — Z7189 Other specified counseling: Secondary | ICD-10-CM

## 2023-01-24 DIAGNOSIS — L659 Nonscarring hair loss, unspecified: Secondary | ICD-10-CM

## 2023-01-24 DIAGNOSIS — Z79899 Other long term (current) drug therapy: Secondary | ICD-10-CM

## 2023-01-24 MED ORDER — MINOXIDIL 2.5 MG PO TABS: 2.5000 mg | ORAL_TABLET | Freq: Every day | ORAL | 5 refills | Status: DC

## 2023-01-24 NOTE — Progress Notes (Signed)
   New Patient Visit   Subjective  Janice Stewart is a 37 y.o. female who presents for the following: hair loss. Patient noticed hair loss after birth of 2nd child. It has been 3 years and hair loss has increased and is getting thinner. No new medications. Patient had IUD inserted 4 months ago. She has been seeing a nutritionist over the last month and has lost 4 pounds by changing eating habits. 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 Also with hx of stroke, Vitamin D deficiency, high blood pressure.   The following portions of the chart were reviewed this encounter and updated as appropriate: medications, allergies, medical history  Review of Systems:  No other skin or systemic complaints except as noted in HPI or Assessment and Plan.  Objective  Well appearing patient in no apparent distress; mood and affect are within normal limits. A focused examination was performed of the following areas: scalp Relevant exam findings are noted in the Assessment and Plan.  Scalp            Assessment & Plan   Alopecia Scalp  Telogen Effluvium  Counseling Telogen effluvium is a benign, self-limited condition causing increased hair shedding usually for several months. It does not progress to baldness, and the hair eventually grows back on its own. It can be triggered by recent illness, recent surgery, thyroid disease, low iron stores, vitamin D deficiency, fad diets or rapid weight loss, hormonal changes such as pregnancy or birth control pills, and some medication. Usually the hair loss starts 2-3 months after the illness or health change. Rarely, it can continue for longer than a year. Treatments options may include oral or topical Minoxidil; Red Light scalp treatments; Biotin 2.5 mg daily and other options.   Start OTC Biotin (at least) 2.5 mg daily.  Start minoxidil 2.5 mg 1/2 tablet daily.   No ankle  swelling today. Labs are reviewed over the  last 6 months and thyroid, CBC and chemistry all okay  Return in about 6 months (around 07/26/2023) for hair loss.  Anise Salvo, RMA, am acting as scribe for Armida Sans, MD .  Documentation: I have reviewed the above documentation for accuracy and completeness, and I agree with the above.  Armida Sans, MD

## 2023-01-24 NOTE — Patient Instructions (Signed)
Telogen Effluvium Counseling Telogen effluvium is a benign, self-limited condition causing increased hair shedding usually for several months. It does not progress to baldness, and the hair eventually grows back on its own. It can be triggered by recent illness, recent surgery, thyroid disease, low iron stores, vitamin D deficiency, fad diets or rapid weight loss, hormonal changes such as pregnancy or birth control pills, and some medication. Usually the hair loss starts 2-3 months after the illness or health change. Rarely, it can continue for longer than a year. Treatments options may include oral or topical Minoxidil; Red Light scalp treatments; Biotin 2.5 mg daily and other options.   Start OTC Biotin (at least) 2.5 mg daily. Fine to take a higher dose.  Start minoxidil 2.5 mg 1/2 tablet daily.   Due to recent changes in healthcare laws, you may see results of your pathology and/or laboratory studies on MyChart before the doctors have had a chance to review them. We understand that in some cases there may be results that are confusing or concerning to you. Please understand that not all results are received at the same time and often the doctors may need to interpret multiple results in order to provide you with the best plan of care or course of treatment. Therefore, we ask that you please give [place] 2 business days to thoroughly review all your results before contacting the office for clarification. Should we see a critical lab result, you will be contacted sooner.   If You Need Anything After Your Visit  If you have any questions or concerns for your doctor, please call our main line at [phone] and press option 4 to reach your doctor's medical assistant. If no one answers, please leave a voicemail as directed and we will return your call as soon as possible. Messages left after 4 pm will be answered the following business day.   You may also send [place] a message via MyChart. We typically respond  to MyChart messages within 1-2 business days.  For prescription refills, please ask your pharmacy to contact our office. Our fax number is [phone].  If you have an urgent issue when the clinic is closed that cannot wait until the next business day, you can page your doctor at the number below.    Please note that while we do our best to be available for urgent issues outside of office hours, we are not available 24/7.   If you have an urgent issue and are unable to reach [place], you may choose to seek medical care at your doctor's office, retail clinic, urgent care center, or emergency room.  If you have a medical emergency, please immediately call 911 or go to the emergency department.  Pager Numbers  - Dr. Gwen Pounds: [phone]  - Dr. Neale Burly: [phone]  - Dr. Roseanne Reno: [phone]  In the event of inclement weather, please call our main line at [phone] for an update on the status of any delays or closures.  Dermatology Medication Tips: Please keep the boxes that topical medications come in in order to help keep track of the instructions about where and how to use these. Pharmacies typically print the medication instructions only on the boxes and not directly on the medication tubes.   If your medication is too expensive, please contact our office at [phone] option 4 or send [place] a message through MyChart.   We are unable to tell what your co-pay for medications will be in advance as this is different depending on  your insurance coverage. However, we may be able to find a substitute medication at lower cost or fill out paperwork to get insurance to cover a needed medication.   If a prior authorization is required to get your medication covered by your insurance company, please allow [place] 1-2 business days to complete this process.  Drug prices often vary depending on where the prescription is filled and some pharmacies may offer cheaper prices.  The website www.goodrx.com  contains coupons for medications through different pharmacies. The prices here do not account for what the cost may be with help from insurance (it may be cheaper with your insurance), but the website can give you the price if you did not use any insurance.  - You can print the associated coupon and take it with your prescription to the pharmacy.  - You may also stop by our office during regular business hours and pick up a GoodRx coupon card.  - If you need your prescription sent electronically to a different pharmacy, notify our office through [facility] or by phone at [phone] option 4.     Si Usted Necesita Algo Despus de Su Visita  Tambin puede enviarnos un mensaje a travs de Clinical cytogeneticist. Por lo general respondemos a los mensajes de MyChart en el transcurso de 1 a 2 das hbiles.  Para renovar recetas, por favor pida a su farmacia que se ponga en contacto con nuestra oficina. Annie Sable de fax es [place] [phone].  Si tiene un asunto urgente cuando la clnica est cerrada y que no puede esperar hasta el siguiente da hbil, puede llamar/localizar a su doctor(a) al nmero que aparece a continuacin.   Por favor, tenga en cuenta que aunque hacemos todo lo posible para estar disponibles para asuntos urgentes fuera del horario de [place], no estamos disponibles las 24 horas del da, los 7 [address]  [address] de la [place].   Si tiene un problema urgente y no puede comunicarse con nosotros, puede optar por buscar atencin mdica  en el consultorio de su doctor(a), en una clnica privada, en un centro de atencin urgente o en una sala de emergencias.  Si tiene Engineer, drilling, por favor llame inmediatamente al 911 o vaya a la sala de emergencias.  Nmeros de bper  - Dr. Gwen Pounds: [phone]  - Dra. Moye: [phone]  - Dra. Roseanne Reno: [phone]  En caso de inclemencias del [place], por favor llame a Lacy Duverney principal al [phone] para una actualizacin sobre el [place]  de cualquier retraso o cierre.  Consejos para la medicacin en dermatologa: Por favor, guarde las cajas en las que vienen los medicamentos de uso tpico para ayudarle a seguir las instrucciones sobre dnde y cmo usarlos. Las farmacias generalmente imprimen las instrucciones del medicamento slo en las cajas y no directamente en los tubos del [place].   Si su medicamento es muy caro, por favor, pngase en contacto con Rolm Gala llamando al [phone] y presione la opcin 4 o envenos un mensaje a travs de Clinical cytogeneticist.   No podemos decirle cul ser su copago por los medicamentos por adelantado ya que esto es diferente dependiendo de la cobertura de su seguro. Sin embargo, es posible que podamos encontrar un medicamento sustituto a Audiological scientist un formulario para que el seguro cubra el medicamento que se considera necesario.   Si se requiere una autorizacin previa para que su compaa de seguros [place] su medicamento, por favor permtanos de 1 a 2 das hbiles para completar [address]  Los precios de los medicamentos varan con frecuencia dependiendo del Environmental consultant de dnde se surte la receta y alguna farmacias pueden ofrecer precios ms baratos.  El sitio web www.goodrx.com tiene cupones para medicamentos de Airline pilot. Los precios aqu no tienen en cuenta lo que podra costar con la ayuda del seguro (puede ser ms barato con su seguro), pero el sitio web puede darle el precio si no utiliz Research scientist (physical sciences).  - Puede imprimir el cupn correspondiente y llevarlo con su receta a la farmacia.  - Tambin puede pasar por nuestra oficina durante el horario de atencin regular y Charity fundraiser una tarjeta de cupones de GoodRx.  - Si necesita que su receta se enve electrnicamente a una farmacia diferente, informe a nuestra oficina a travs de MyChart de [place] o por telfono llamando al [phone] y presione la opcin 4.

## 2023-01-26 ENCOUNTER — Encounter: Payer: Self-pay | Admitting: Dermatology

## 2023-02-18 ENCOUNTER — Encounter: Payer: Self-pay | Admitting: Physical Therapy

## 2023-02-18 ENCOUNTER — Other Ambulatory Visit: Payer: Self-pay

## 2023-02-18 ENCOUNTER — Ambulatory Visit: Payer: Medicaid Other | Admitting: Physical Therapy

## 2023-02-18 DIAGNOSIS — M6281 Muscle weakness (generalized): Secondary | ICD-10-CM | POA: Insufficient documentation

## 2023-02-18 DIAGNOSIS — M6208 Separation of muscle (nontraumatic), other site: Secondary | ICD-10-CM | POA: Diagnosis not present

## 2023-02-18 DIAGNOSIS — R293 Abnormal posture: Secondary | ICD-10-CM | POA: Diagnosis not present

## 2023-02-18 DIAGNOSIS — M62838 Other muscle spasm: Secondary | ICD-10-CM | POA: Insufficient documentation

## 2023-02-18 NOTE — Therapy (Signed)
OUTPATIENT PHYSICAL THERAPY FEMALE PELVIC EVALUATION   Patient Name: Janice Stewart MRN: [id number] DOB:1985/11/18, 37 y.o., female Today's Date: 02/18/2023  END OF SESSION:  PT End of Session - 02/18/23 1244     Visit Number 1    Date for PT Re-Evaluation 05/13/23    Authorization Type Medicaid - [employer] [place]    PT Start Time 1235    PT Stop Time 1315    PT Time Calculation (min) 40 min    Activity Tolerance Patient tolerated treatment well    Behavior During Therapy WFL for tasks assessed/performed             Past Medical History:  Diagnosis Date   Cancer (HCC)    Hypertension    Sickle cell trait (HCC)    Stroke [facility])    Past Surgical History:  Procedure Laterality Date   LIVER BIOPSY  2017   LUNG BIOPSY  2017   Patient Active Problem List   Diagnosis Date Noted   Low back pain 03/26/2022   Bone infarct (HCC) 02/22/2022   Simple obesity 09/22/2021   Sickle cell trait (HCC) 02/22/2020   Gestational choriocarcinoma (HCC) in 2017 01/25/2020   History of CVA (cerebrovascular accident) 01/25/2020    PCP: Rema Fendt, NP   REFERRING PROVIDER: Lorriane Shire, MD   REFERRING DIAG: M62.08 (ICD-10-CM) - Diastasis recti   THERAPY DIAG:  Muscle weakness (generalized) - Plan: PT plan of care cert/re-cert  Other muscle spasm - Plan: PT plan of care cert/re-cert  Abnormal posture - Plan: PT plan of care cert/re-cert  Rationale for Evaluation and Treatment: Rehabilitation  ONSET DATE: 07/2020 - after second child  SUBJECTIVE:                                                                                                                                                                                           SUBJECTIVE STATEMENT: Pt has been feeling low back pain and feeling the abdominal wall is very weak. Walking every day 30-60 min helps.  Standing a long time like in kitchen causes the pain to come back Fluid intake: Yes: bottles of water  about 5-6    PAIN:  Are you having pain? Yes NPRS scale: 8/10 Pain location:  low back  Pain type: aching Pain description: intermittent   Aggravating factors: standing for a few hours Relieving factors: take motrin or tylenol  PRECAUTIONS: None  WEIGHT BEARING RESTRICTIONS: No  FALLS:  Has patient fallen in last 6 months? No  LIVING ENVIRONMENT: Lives with: lives with their spouse and two kids Lives in: House/apartment   OCCUPATION: home  maker  PLOF: Independent  PATIENT GOALS: get rid of pain and know how to strengthen maybe doing more home workouts  PERTINENT HISTORY:  2 vaginal deliveries Sexual abuse: No  BOWEL MOVEMENT: Pain with bowel movement: No Type of bowel movement:normal and no strain - very regular  URINATION: Pain with urination: No Fully empty bladder: Yes:   Stream: Strong Urgency: No No leakage or frequency  INTERCOURSE: Pain with intercourse:  No   PREGNANCY: Vaginal deliveries 2 Tearing No   PROLAPSE: None   OBJECTIVE:   DIAGNOSTIC FINDINGS:    PATIENT SURVEYS:    PFIQ-7 = 81  COGNITION: Overall cognitive status: Within functional limits for tasks assessed     SENSATION:   MUSCLE LENGTH: Hamstrings: Right 50 deg; Left 50 deg   LUMBAR SPECIAL TESTS:  Straight leg raise test: Negative  FUNCTIONAL TESTS:  Single leg stand - normal  GAIT:  Comments: WFL   POSTURE:  scoliosis mild to the Rt  PELVIC ALIGNMENT: normal  LUMBARAROM/PROM:  A/PROM A/PROM  eval  Flexion 75%  Extension   Right lateral flexion   Left lateral flexion   Right rotation   Left rotation    (Blank rows = not tested)  LOWER EXTREMITY ROM:  Passive ROM Right eval Left eval  Hip flexion [facility]   Hip extension    Hip abduction    Hip adduction    Hip internal rotation 75% 75%  Hip external rotation 75% 75%  Knee flexion    Knee extension    Ankle dorsiflexion    Ankle plantarflexion    Ankle inversion    Ankle  eversion     (Blank rows = not tested)  LOWER EXTREMITY MMT:  MMT Right eval Left eval  Hip flexion    Hip extension    Hip abduction 4/5 +P   Hip adduction 4/5 +P   Hip internal rotation    Hip external rotation 4/5   Knee flexion    Knee extension    Ankle dorsiflexion    Ankle plantarflexion    Ankle inversion    Ankle eversion     PALPATION:   General  tender to palpation Rt>Lt lumbar and gluteals and attachment                  Patient confirms identification and approves PT to assess internal pelvic floor and treatment deferred  PELVIC MMT: deferred  TODAY'S TREATMENT:                                                                                                                              DATE: 02/18/23  EVAL and initial HEP   PATIENT EDUCATION:  Education details: Access Code: [email or web address] Person educated: Patient Education method: Explanation, Demonstration, Verbal cues, and Handouts Education comprehension: verbalized understanding and returned demonstration  HOME EXERCISE PROGRAM: Access Code: [email or web address] URL: [email or web address] Date: 02/18/2023 Prepared by: Dwana Curd  Exercises - Quadruped Alternating  Arm Lift  - 1 x daily - 7 x weekly - 2 sets - 10 reps - Cat Cow to Child's Pose  - 1 x daily - 7 x weekly - 1 sets - 5 reps - 10 sec hold - Quadruped Rocking Slow  - 1 x daily - 7 x weekly - 3 sets - 10 reps - Standing Hamstring Stretch with Step  - 1 x daily - 7 x weekly - 1 sets - 3 reps - 30 sec hold  ASSESSMENT:  CLINICAL IMPRESSION: Patient is a 37 y.o. female who was seen today for physical therapy evaluation and treatment for diastasis rectus abdominus. Pt has tension throughout posterior kinetic chain muscle groups and significant tenderness along sacral ligaments bil and Rt lumbar paraspinals.  Pt has very tight limited hamstring mobility.  Pt has diastasis of 3 finger width and 1 knuckle deep at umbilicus and gradually  closes up to xyphoid process.  Pt demonstrates some hip weakness bilaterally.  Pt will benefit from core strengthening and manual techniques for low back pain management and full return to functional activities  OBJECTIVE IMPAIRMENTS: decreased coordination, decreased ROM, decreased strength, increased muscle spasms, impaired flexibility, postural dysfunction, and pain.   ACTIVITY LIMITATIONS: standing  PARTICIPATION LIMITATIONS: meal prep, cleaning, laundry, and community activity  PERSONAL FACTORS: 1 comorbidity: 2 vaginal deliveries  are also affecting patient's functional outcome.   REHAB POTENTIAL: Excellent  CLINICAL DECISION MAKING: Stable/uncomplicated  EVALUATION COMPLEXITY: Low   GOALS: Goals reviewed with patient? Yes  SHORT TERM GOALS: Target date: 03/18/23  Ind with initial HEP Baseline: Goal status: INITIAL  2.  Pt will report 25% less back pain Baseline:  Goal status: INITIAL  LONG TERM GOALS: Target date: 05/13/23  Pt will be independent with advanced HEP to maintain improvements made throughout therapy  Baseline:  Goal status: INITIAL  2.  Pt will report 75% reduction of pain due to improvements in posture, strength, and muscle length  Baseline:  Goal status: INITIAL  3.  Pt will have reduced DRA to less than 1 finger width for improved functional stability Baseline:  Goal status: INITIAL  4.  Pt will have 5/5 bil hip strength for improved functional activities and standing without pain Baseline:  Goal status: INITIAL   PLAN:  PT FREQUENCY: 1-2x/week  PT DURATION: 12 weeks  PLANNED INTERVENTIONS: Therapeutic exercises, Therapeutic activity, Neuromuscular re-education, Balance training, Gait training, Patient/Family education, Self Care, Joint mobilization, Dry Needling, Electrical stimulation, Spinal manipulation, Spinal mobilization, Cryotherapy, Moist heat, Taping, Traction, Ultrasound, Biofeedback, Manual therapy, and Re-evaluation  PLAN FOR  NEXT SESSION: manual treatments for pain management lumbar and gluteals; core strength   Brayton Caves Rebeccah Ivins, PT 02/18/2023, 5:55 PM

## 2023-02-25 ENCOUNTER — Ambulatory Visit: Payer: Medicaid Other | Admitting: Physical Therapy

## 2023-02-25 ENCOUNTER — Encounter: Payer: Self-pay | Admitting: Physical Therapy

## 2023-02-25 DIAGNOSIS — M62838 Other muscle spasm: Secondary | ICD-10-CM

## 2023-02-25 DIAGNOSIS — M6281 Muscle weakness (generalized): Secondary | ICD-10-CM

## 2023-02-25 DIAGNOSIS — M6208 Separation of muscle (nontraumatic), other site: Secondary | ICD-10-CM | POA: Diagnosis not present

## 2023-02-25 DIAGNOSIS — R293 Abnormal posture: Secondary | ICD-10-CM | POA: Diagnosis not present

## 2023-02-25 NOTE — Therapy (Signed)
OUTPATIENT PHYSICAL THERAPY FEMALE PELVIC EVALUATION   Patient Name: Janice Stewart MRN: [id number] DOB:1985-10-22, 37 y.o., female Today's Date: 02/25/2023  END OF SESSION:  PT End of Session - 02/25/23 1529     Visit Number 2    Date for PT Re-Evaluation 05/13/23    Authorization Type Medicaid - [employer] Approved 5 visits, 02/18/2023-04/18/2023-auth#[id number]    PT Start Time 1529    PT Stop Time 1613    PT Time Calculation (min) 44 min    Activity Tolerance Patient tolerated treatment well    Behavior During Therapy WFL for tasks assessed/performed              Past Medical History:  Diagnosis Date   Cancer (HCC)    Hypertension    Sickle cell trait (HCC)    Stroke [facility])    Past Surgical History:  Procedure Laterality Date   LIVER BIOPSY  2017   LUNG BIOPSY  2017   Patient Active Problem List   Diagnosis Date Noted   Low back pain 03/26/2022   Bone infarct (HCC) 02/22/2022   Simple obesity 09/22/2021   Sickle cell trait (HCC) 02/22/2020   Gestational choriocarcinoma (HCC) in 2017 01/25/2020   History of CVA (cerebrovascular accident) 01/25/2020    PCP: Rema Fendt, NP   REFERRING PROVIDER: Lorriane Shire, MD   REFERRING DIAG: M62.08 (ICD-10-CM) - Diastasis recti   THERAPY DIAG:  Muscle weakness (generalized)  Other muscle spasm  Abnormal posture  Rationale for Evaluation and Treatment: Rehabilitation  ONSET DATE: 07/2020 - after second child  SUBJECTIVE:                                                                                                                                                                                           SUBJECTIVE STATEMENT: Pt has been doing the stretches.  Feeling some pain today Fluid intake: Yes: bottles of water about 5-6    PAIN:  Are you having pain? Yes NPRS scale: 6/10 Pain location:  low back  Pain type: aching Pain description: intermittent   Aggravating factors:  standing for a few hours Relieving factors: take motrin or tylenol  PRECAUTIONS: None  WEIGHT BEARING RESTRICTIONS: No  FALLS:  Has patient fallen in last 6 months? No  LIVING ENVIRONMENT: Lives with: lives with their spouse and two kids Lives in: House/apartment   OCCUPATION: home maker  PLOF: Independent  PATIENT GOALS: get rid of pain and know how to strengthen maybe doing more home workouts  PERTINENT HISTORY:  2 vaginal deliveries Sexual abuse: No  BOWEL MOVEMENT: Pain with bowel movement: No  Type of bowel movement:normal and no strain - very regular  URINATION: Pain with urination: No Fully empty bladder: Yes:   Stream: Strong Urgency: No No leakage or frequency  INTERCOURSE: Pain with intercourse:  No   PREGNANCY: Vaginal deliveries 2 Tearing No   PROLAPSE: None   OBJECTIVE:   DIAGNOSTIC FINDINGS:    PATIENT SURVEYS:    PFIQ-7 = 81  COGNITION: Overall cognitive status: Within functional limits for tasks assessed     SENSATION:   MUSCLE LENGTH: Hamstrings: Right 50 deg; Left 50 deg   LUMBAR SPECIAL TESTS:  Straight leg raise test: Negative  FUNCTIONAL TESTS:  Single leg stand - normal  GAIT:  Comments: WFL   POSTURE:  scoliosis mild to the Rt  PELVIC ALIGNMENT: normal  LUMBARAROM/PROM:  A/PROM A/PROM  eval  Flexion 75%  Extension   Right lateral flexion   Left lateral flexion   Right rotation   Left rotation    (Blank rows = not tested)  LOWER EXTREMITY ROM:  Passive ROM Right eval Left eval  Hip flexion [facility]   Hip extension    Hip abduction    Hip adduction    Hip internal rotation 75% 75%  Hip external rotation 75% 75%  Knee flexion    Knee extension    Ankle dorsiflexion    Ankle plantarflexion    Ankle inversion    Ankle eversion     (Blank rows = not tested)  LOWER EXTREMITY MMT:  MMT Right eval Left eval  Hip flexion    Hip extension    Hip abduction 4/5 +P   Hip adduction 4/5 +P    Hip internal rotation    Hip external rotation 4/5   Knee flexion    Knee extension    Ankle dorsiflexion    Ankle plantarflexion    Ankle inversion    Ankle eversion     PALPATION:   General  tender to palpation Rt>Lt lumbar and gluteals and attachment                  Patient confirms identification and approves PT to assess internal pelvic floor and treatment deferred  PELVIC MMT: deferred  TODAY'S TREATMENT                                                                                                                             DATE: 02/25/23   Exercise: Quadruped - engage transversus abdominus , thoracic rotation Knee to chest Lumbar rotation  Manual: Fascial release to lumbar in quadruped   NMRE: Core bracing with ball - between knees Core brace 3lb overhead Core brace press on thighs   02/18/23  EVAL and initial HEP   PATIENT EDUCATION:  Education details: Access Code: [email or web address] Person educated: Patient Education method: Explanation, Demonstration, Verbal cues, and Handouts Education comprehension: verbalized understanding and returned demonstration  HOME EXERCISE PROGRAM: Access Code: [email or web address] URL: [email or web address] Date: 02/25/2023 Prepared by: Adela Lank  Annalynne Ibanez  Exercises - Quadruped Alternating Arm Lift  - 1 x daily - 7 x weekly - 2 sets - 10 reps - Cat Cow to Child's Pose  - 1 x daily - 7 x weekly - 1 sets - 5 reps - 10 sec hold - Quadruped Rocking Slow  - 1 x daily - 7 x weekly - 3 sets - 10 reps - Standing Hamstring Stretch with Step  - 1 x daily - 7 x weekly - 1 sets - 3 reps - 30 sec hold - Hooklying Transversus Abdominis Palpation  - 1 x daily - 7 x weekly - 3 sets - 10 reps - Hooklying Lumbar Rotation  - 1 x daily - 7 x weekly - 1 sets - 10 reps - 3 sec hold - Quadruped Thoracic Rotation Full Range with Hand on Neck  - 1 x daily - 7 x weekly - 3 sets - 10 reps - Hooklying Rib Cage Breathing  - 1 x daily - 7 x weekly - 3  sets - 10 reps  ASSESSMENT:  CLINICAL IMPRESSION: Pt has been doing stretches at home. Pt not noticing much difference yet.  Focused a little on release of low back.  Also getting transversus abdominus and obliques engaged and this needed cues to keep ribcage and back pressing against mat in supine.  Pt did the best with ball between kness to activate the deep core and done with exhale.  Pt will benefit from skilled PT to continue to improve core strength and posture for improved pain management.  OBJECTIVE IMPAIRMENTS: decreased coordination, decreased ROM, decreased strength, increased muscle spasms, impaired flexibility, postural dysfunction, and pain.   ACTIVITY LIMITATIONS: standing  PARTICIPATION LIMITATIONS: meal prep, cleaning, laundry, and community activity  PERSONAL FACTORS: 1 comorbidity: 2 vaginal deliveries  are also affecting patient's functional outcome.   REHAB POTENTIAL: Excellent  CLINICAL DECISION MAKING: Stable/uncomplicated  EVALUATION COMPLEXITY: Low   GOALS: Goals reviewed with patient? Yes  SHORT TERM GOALS: Target date: 03/18/23  Ind with initial HEP Baseline: Goal status: MET  2.  Pt will report 25% less back pain Baseline:  Goal status: In progress  LONG TERM GOALS: Target date: 05/13/23  Pt will be independent with advanced HEP to maintain improvements made throughout therapy  Baseline:  Goal status: INITIAL  2.  Pt will report 75% reduction of pain due to improvements in posture, strength, and muscle length  Baseline:  Goal status: INITIAL  3.  Pt will have reduced DRA to less than 1 finger width for improved functional stability Baseline:  Goal status: INITIAL  4.  Pt will have 5/5 bil hip strength for improved functional activities and standing without pain Baseline:  Goal status: INITIAL   PLAN:  PT FREQUENCY: 1-2x/week  PT DURATION: 12 weeks  PLANNED INTERVENTIONS: Therapeutic exercises, Therapeutic activity, Neuromuscular  re-education, Balance training, Gait training, Patient/Family education, Self Care, Joint mobilization, Dry Needling, Electrical stimulation, Spinal manipulation, Spinal mobilization, Cryotherapy, Moist heat, Taping, Traction, Ultrasound, Biofeedback, Manual therapy, and Re-evaluation  PLAN FOR NEXT SESSION: manual treatments for pain management lumbar and gluteals - try dry needling if no change since previous visit; core strength progressions   Junious Silk, PT 02/25/2023, 3:29 PM

## 2023-03-06 ENCOUNTER — Ambulatory Visit: Payer: Medicaid Other

## 2023-03-08 ENCOUNTER — Ambulatory Visit
Admission: EM | Admit: 2023-03-08 | Discharge: 2023-03-08 | Disposition: A | Discharge status: Home / Self Care | Payer: Medicaid Other | Attending: Internal Medicine | Admitting: Internal Medicine

## 2023-03-08 ENCOUNTER — Telehealth: Payer: Self-pay | Admitting: *Deleted

## 2023-03-08 DIAGNOSIS — N939 Abnormal uterine and vaginal bleeding, unspecified: Secondary | ICD-10-CM | POA: Insufficient documentation

## 2023-03-08 DIAGNOSIS — N898 Other specified noninflammatory disorders of vagina: Secondary | ICD-10-CM | POA: Insufficient documentation

## 2023-03-08 DIAGNOSIS — Z113 Encounter for screening for infections with a predominantly sexual mode of transmission: Secondary | ICD-10-CM | POA: Insufficient documentation

## 2023-03-08 LAB — POCT URINALYSIS DIP (MANUAL ENTRY)
Bilirubin, UA: NEGATIVE
Blood, UA: NEGATIVE
Glucose, UA: NEGATIVE mg/dL
Ketones, POC UA: NEGATIVE mg/dL
Leukocytes, UA: NEGATIVE
Nitrite, UA: NEGATIVE
Protein Ur, POC: NEGATIVE mg/dL
Spec Grav, UA: 1.015 (ref 1.010–1.025)
Urobilinogen, UA: 0.2 E.U./dL
pH, UA: 6.5 (ref 5.0–8.0)

## 2023-03-08 LAB — POCT URINE PREGNANCY: Preg Test, Ur: NEGATIVE

## 2023-03-08 NOTE — Discharge Instructions (Signed)
Urine pregnancy test was negative.  Urine test to test for UTI was also normal.  Vaginal swab to test for vaginal infections and STDs is pending.  Will call if it is abnormal.  Please follow-up with family medicine doctor and/or gynecology as ultrasound is most likely necessary to evaluate cyst area on her vagina.

## 2023-03-08 NOTE — Telephone Encounter (Signed)
Called Pt, asking for a referral to have ultrasound to have a bump checked out.

## 2023-03-08 NOTE — Telephone Encounter (Signed)
Please call patient regarding their concerns

## 2023-03-08 NOTE — ED Provider Notes (Signed)
[facility]    CSN: [id number] Arrival date & time: 03/08/23  0801      History   Chief Complaint Chief Complaint  Patient presents with   Abscess    HPI Janice Stewart is a 37 y.o. female.   Patient presents with 2 different chief complaints today.  Reports that she has a "knot" on her left upper vaginal area that has been present for about 5 to 6 days.  States that it is not painful or bothersome.  Denies any drainage from the area.  Denies any associated fever.  Patient also reports that she has been having some very light vaginal spotting that is intermittent over the past 3 to 4 days.  States that previous to this she has been having normal menstrual cycles with last menstrual cycle being 02/19/2023.  Denies vaginal discharge, abdominal pain, pelvic pain, painful intercourse, back pain, chills, nausea, vomiting.  She is sexually active but denies exposure to STD.  States that she had some spotting when they first placed her IUD in February but otherwise has not had spotting since.  States that she was previously on Depo birth control shot where she had vaginal bleeding for about a year.  She states that she called gynecology but reports that they are not able to see her until September.   Abscess   Past Medical History:  Diagnosis Date   Cancer [facility])    Hypertension    Sickle cell trait (HCC)    Stroke [facility])     Patient Active Problem List   Diagnosis Date Noted   Low back pain 03/26/2022   Bone infarct (HCC) 02/22/2022   Simple obesity 09/22/2021   Sickle cell trait (HCC) 02/22/2020   Gestational choriocarcinoma (HCC) in 2017 01/25/2020   History of CVA (cerebrovascular accident) 01/25/2020    Past Surgical History:  Procedure Laterality Date   LIVER BIOPSY  2017   LUNG BIOPSY  2017    OB History     Gravida  4   Para  2   Term  2   Preterm      AB  1   Living  2      SAB  1   IAB      Ectopic      Multiple  0   Live  Births  2            Home Medications    Prior to Admission medications   Medication Sig Start Date End Date Taking? Authorizing Provider  ASHWAGANDHA PO Take by mouth.   Yes [provider]  Cholecalciferol (VITAMIN D3) 50 MCG (2000 UT) CHEW Chew 50 mcg by mouth daily.   Yes [provider]  folic acid (FOLVITE) 1 MG tablet Take 1 tablet (1 mg total) by mouth daily. 07/10/22 07/10/23 Yes Massie Maroon, FNP  minoxidil (LONITEN) 2.5 MG tablet Take 1 tablet (2.5 mg total) by mouth daily. 01/24/23  Yes Deirdre Evener, MD  valsartan (DIOVAN) 40 MG tablet Take 1 tablet (40 mg total) by mouth daily. 01/14/23  Yes Zonia Kief, Amy J, NP  medroxyPROGESTERone (DEPO-PROVERA) 150 MG/ML injection Inject 150 mg into the muscle every 3 (three) months. Patient not taking: Reported on 01/08/2023    [provider]    Family History Family History  Problem Relation Age of Onset   Varicose Veins Mother    Hypertension Father    Cancer Father  prostate    Social History Social History   Tobacco Use   Smoking status: Never    Passive exposure: Never   Smokeless tobacco: Never  Vaping Use   Vaping status: Never Used  Substance Use Topics   Alcohol use: Not Currently   Drug use: Never     Allergies   Patient has no known allergies.   Review of Systems Review of Systems Per HPI  Physical Exam Triage Vital Signs ED Triage Vitals [03/08/23 0807]  Encounter Vitals Group     BP (!) 148/90     Systolic BP Percentile      Diastolic BP Percentile      Pulse Rate 69     Resp 16     Temp 97.8 F (36.6 C)     Temp Source Oral     SpO2 99 %     Weight      Height      Head Circumference      Peak Flow      Pain Score      Pain Loc      Pain Education      Exclude from Growth Chart    No data found.  Updated Vital Signs BP (!) 148/90 (BP Location: Left Arm)   Pulse 69   Temp 97.8 F (36.6 C) (Oral)   Resp 16   LMP 02/19/2023  (Approximate)   SpO2 99%   Visual Acuity Right Eye Distance:   Left Eye Distance:   Bilateral Distance:    Right Eye Near:   Left Eye Near:    Bilateral Near:     Physical Exam Exam conducted with a chaperone present.  Constitutional:      General: She is not in acute distress.    Appearance: Normal appearance. She is not toxic-appearing or diaphoretic.  HENT:     Head: Normocephalic and atraumatic.  Eyes:     Extraocular Movements: Extraocular movements intact.     Conjunctiva/sclera: Conjunctivae normal.  Pulmonary:     Effort: Pulmonary effort is normal.  Abdominal:    Genitourinary:    Comments: Patient has approximately 1 cm area of induration that is palpable present to left upper mons pubis.  There is no obvious significant swelling, discoloration, fluctuance, drainage. Self vaginal swab performed.  Neurological:     General: No focal deficit present.     Mental Status: She is alert and oriented to person, place, and time. Mental status is at baseline.  Psychiatric:        Mood and Affect: Mood normal.        Behavior: Behavior normal.        Thought Content: Thought content normal.        Judgment: Judgment normal.      UC Treatments / Results  Labs (all labs ordered are listed, but only abnormal results are displayed) Labs Reviewed  POCT URINALYSIS DIP (MANUAL ENTRY)  POCT URINE PREGNANCY  CERVICOVAGINAL ANCILLARY ONLY    EKG   Radiology No results found.  Procedures Procedures (including critical care time)  Medications Ordered in UC Medications - No data to display  Initial Impression / Assessment and Plan / UC Course  I have reviewed the triage vital signs and the nursing notes.  Pertinent labs & imaging results that were available during my care of the patient were reviewed by me and considered in my medical decision making (see chart for details).     1.  Vaginal lesion  Differential diagnoses for vaginal lesion include cyst versus  lipoma.  There is no signs of infection on exam so abscess does not appear to be present.  It also does not appear to be consistent with lymph node swelling.  Advised patient ultrasound of the area would be reasonable to determine exact etiology which cannot be performed or ordered here ar urgent care.  Encouraged follow-up with gynecology and/or family medicine to have this performed.  She voiced understanding of this.  2.  Vaginal spotting  Patient reports vaginal spotting is minimal so no need for emergent evaluation or CBC at this time.  Urine pregnancy test was completed that was negative.  UA also completely unremarkable.  Will send cervicovaginal swab to test for any for vaginitis that could be causing symptoms.  Awaiting results.  Given no pelvic pain or any associated symptoms, pelvic exam was deferred.  Patient encouraged to follow-up with her gynecologist for further evaluation and management of this.  Advised strict return precautions.  Patient verbalized understanding and was agreeable with plan. Final Clinical Impressions(s) / UC Diagnoses   Final diagnoses:  Vaginal spotting  Vaginal lesion  Screening examination for venereal disease     Discharge Instructions      Urine pregnancy test was negative.  Urine test to test for UTI was also normal.  Vaginal swab to test for vaginal infections and STDs is pending.  Will call if it is abnormal.  Please follow-up with family medicine doctor and/or gynecology as ultrasound is most likely necessary to evaluate cyst area on her vagina.    ED Prescriptions   None    PDMP not reviewed this encounter.   Gustavus Bryant, [place] 03/08/23 [phone]

## 2023-03-08 NOTE — ED Triage Notes (Signed)
Pt reports she has an knot on her vagina. Pt reports she has been spotting between her menstrual cycle.  Pt states she has appointment with her GYN in Sep.

## 2023-03-13 ENCOUNTER — Other Ambulatory Visit: Payer: Self-pay | Admitting: Family

## 2023-03-13 DIAGNOSIS — N898 Other specified noninflammatory disorders of vagina: Secondary | ICD-10-CM

## 2023-03-13 NOTE — Telephone Encounter (Signed)
Referral to Gynecology for evaluation/management vaginal lesion. Expect call soon with appointment details.

## 2023-03-13 NOTE — Telephone Encounter (Signed)
Patient advised that GYN referral was made.  She is asking if she still needs appt for 8/2? Please advise.

## 2023-03-14 ENCOUNTER — Ambulatory Visit
Payer: Medicaid Other | Attending: Obstetrics and Gynecology | Admitting: Rehabilitative and Restorative Service Providers"

## 2023-03-14 ENCOUNTER — Encounter: Payer: Self-pay | Admitting: Rehabilitative and Restorative Service Providers"

## 2023-03-14 DIAGNOSIS — M62838 Other muscle spasm: Secondary | ICD-10-CM | POA: Insufficient documentation

## 2023-03-14 DIAGNOSIS — R293 Abnormal posture: Secondary | ICD-10-CM | POA: Diagnosis not present

## 2023-03-14 DIAGNOSIS — M6281 Muscle weakness (generalized): Secondary | ICD-10-CM | POA: Insufficient documentation

## 2023-03-14 NOTE — Therapy (Addendum)
OUTPATIENT PHYSICAL THERAPY TREATMENT NOTE AND LATE ENTRY DISCHARGE SUMMARY   Patient Name: Janice Stewart MRN: [id number] DOB:Jun 06, 1986, 37 y.o., female Today's Date: 03/14/2023  END OF SESSION:  PT End of Session - 03/14/23 0809     Visit Number 3    Date for PT Re-Evaluation 05/13/23    Authorization Type Medicaid - [employer] Approved 5 visits, 02/18/2023-04/18/2023-auth#[id number]    Authorization - Visit Number 2    Authorization - Number of Visits 5    PT Start Time 0802    PT Stop Time 0842    PT Time Calculation (min) 40 min    Activity Tolerance Patient tolerated treatment well    Behavior During Therapy [facility] for tasks assessed/performed              Past Medical History:  Diagnosis Date   Cancer (HCC)    Hypertension    Sickle cell trait (HCC)    Stroke [facility])    Past Surgical History:  Procedure Laterality Date   LIVER BIOPSY  2017   LUNG BIOPSY  2017   Patient Active Problem List   Diagnosis Date Noted   Low back pain 03/26/2022   Bone infarct (HCC) 02/22/2022   Simple obesity 09/22/2021   Sickle cell trait (HCC) 02/22/2020   Gestational choriocarcinoma (HCC) in 2017 01/25/2020   History of CVA (cerebrovascular accident) 01/25/2020    PCP: Rema Fendt, NP   REFERRING PROVIDER: Lorriane Shire, MD   REFERRING DIAG: M62.08 (ICD-10-CM) - Diastasis recti   THERAPY DIAG:  Muscle weakness (generalized)  Other muscle spasm  Abnormal posture  Rationale for Evaluation and Treatment: Rehabilitation  ONSET DATE: 07/2020 - after second child  SUBJECTIVE:                                                                                                                                                                                           SUBJECTIVE STATEMENT: Pt reports that her abdominals are feeling better, but she is having more low back pain.  Fluid intake: Yes: bottles of water about 5-6    PAIN:  Are you having pain?  Yes NPRS scale: 5/10 Pain location:  low back  Pain type: aching Pain description: intermittent   Aggravating factors: standing for a few hours Relieving factors: take motrin or tylenol  PRECAUTIONS: None  WEIGHT BEARING RESTRICTIONS: No  FALLS:  Has patient fallen in last 6 months? No  LIVING ENVIRONMENT: Lives with: lives with their spouse and two kids Lives in: House/apartment   OCCUPATION: home maker  PLOF: Independent  PATIENT GOALS: get rid of pain and  know how to strengthen maybe doing more home workouts  PERTINENT HISTORY:  2 vaginal deliveries Sexual abuse: No  BOWEL MOVEMENT: Pain with bowel movement: No Type of bowel movement:normal and no strain - very regular  URINATION: Pain with urination: No Fully empty bladder: Yes:   Stream: Strong Urgency: No No leakage or frequency  INTERCOURSE: Pain with intercourse:  No   PREGNANCY: Vaginal deliveries 2 Tearing No   PROLAPSE: None   OBJECTIVE:   DIAGNOSTIC FINDINGS:    PATIENT SURVEYS:    PFIQ-7 = 81  COGNITION: Overall cognitive status: Within functional limits for tasks assessed     SENSATION:   MUSCLE LENGTH: Hamstrings: Right 50 deg; Left 50 deg   LUMBAR SPECIAL TESTS:  Straight leg raise test: Negative  FUNCTIONAL TESTS:  Single leg stand - normal  GAIT:  Comments: WFL   POSTURE:  scoliosis mild to the Rt  PELVIC ALIGNMENT: normal  LUMBARAROM/PROM:  A/PROM A/PROM  eval  Flexion 75%  Extension   Right lateral flexion   Left lateral flexion   Right rotation   Left rotation    (Blank rows = not tested)  LOWER EXTREMITY ROM:  Passive ROM Right eval Left eval  Hip flexion [facility]   Hip extension    Hip abduction    Hip adduction    Hip internal rotation 75% 75%  Hip external rotation 75% 75%  Knee flexion    Knee extension    Ankle dorsiflexion    Ankle plantarflexion    Ankle inversion    Ankle eversion     (Blank rows = not tested)  LOWER  EXTREMITY MMT:  MMT Right eval Left eval  Hip flexion    Hip extension    Hip abduction 4/5 +P   Hip adduction 4/5 +P   Hip internal rotation    Hip external rotation 4/5   Knee flexion    Knee extension    Ankle dorsiflexion    Ankle plantarflexion    Ankle inversion    Ankle eversion     PALPATION:   General  tender to palpation Rt>Lt lumbar and gluteals and attachment                  Patient confirms identification and approves PT to assess internal pelvic floor and treatment deferred  PELVIC MMT: deferred  TODAY'S TREATMENT                                                                                                                              DATE: 03/14/2023 Nustep level 5 x5 min with PT present to discuss status Standing hamstring stretch 2x20 sec bilat Quadruped TA contraction 2x10 Quadruped cat/cow 2x10 Child's pose 2x10 sec Quadruped thread the needle x10 bilat Supine TA contraction with pressing ball into thighs 2x10 Supine lower trunk rotation x10 Trigger Point Dry-Needling  Treatment instructions: Expect mild to moderate muscle soreness. S/S of pneumothorax if dry  needled over a lung field, and to seek immediate medical attention should they occur. Patient verbalized understanding of these instructions and education. Patient Consent Given: Yes Education handout provided: Yes Muscles treated: bilateral lumbar multifidi, bilateral glutes/piriformis Electrical stimulation performed: No Parameters: N/A Treatment response/outcome: Utilized skilled palpation to identify bony landmarks and trigger points.  Able to illicit twitch response and muscle elongation.  Soft tissue mobilization following to further promote tissue elongation.     DATE: 02/25/23   Exercise: Quadruped - engage transversus abdominus , thoracic rotation Knee to chest Lumbar rotation  Manual: Fascial release to lumbar in quadruped   NMRE: Core bracing with ball - between  knees Core brace 3lb overhead Core brace press on thighs     PATIENT EDUCATION:  Education details: Access Code: [email or web address] Person educated: Patient Education method: Explanation, Demonstration, Verbal cues, and Handouts Education comprehension: verbalized understanding and returned demonstration  HOME EXERCISE PROGRAM: Access Code: [email or web address] URL: [email or web address] Date: 02/25/2023 Prepared by: Dwana Curd  Exercises - Quadruped Alternating Arm Lift  - 1 x daily - 7 x weekly - 2 sets - 10 reps - Cat Cow to Child's Pose  - 1 x daily - 7 x weekly - 1 sets - 5 reps - 10 sec hold - Quadruped Rocking Slow  - 1 x daily - 7 x weekly - 3 sets - 10 reps - Standing Hamstring Stretch with Step  - 1 x daily - 7 x weekly - 1 sets - 3 reps - 30 sec hold - Hooklying Transversus Abdominis Palpation  - 1 x daily - 7 x weekly - 3 sets - 10 reps - Hooklying Lumbar Rotation  - 1 x daily - 7 x weekly - 1 sets - 10 reps - 3 sec hold - Quadruped Thoracic Rotation Full Range with Hand on Neck  - 1 x daily - 7 x weekly - 3 sets - 10 reps - Hooklying Rib Cage Breathing  - 1 x daily - 7 x weekly - 3 sets - 10 reps  ASSESSMENT:  CLINICAL IMPRESSION: Pt presents to skilled PT reporting that she is a little worried about her recent nodule palpated around her vaginal area.  Patient is pending a referral to a gynecologist for further work-up.  Patient reports that pain is primarily in her low back instead of abdomen now.  Patient reports having dry needling in the past and is agreeable to treatment today.  Noted increased twitch response on left lumbar multifidi and right piriformis.  Patient with overall good response to treatment with good core activation noted.  Patient continues to require skilled PT to progress towards decreased pain.  OBJECTIVE IMPAIRMENTS: decreased coordination, decreased ROM, decreased strength, increased muscle spasms, impaired flexibility, postural dysfunction,  and pain.   ACTIVITY LIMITATIONS: standing  PARTICIPATION LIMITATIONS: meal prep, cleaning, laundry, and community activity  PERSONAL FACTORS: 1 comorbidity: 2 vaginal deliveries  are also affecting patient's functional outcome.   REHAB POTENTIAL: Excellent  CLINICAL DECISION MAKING: Stable/uncomplicated  EVALUATION COMPLEXITY: Low   GOALS: Goals reviewed with patient? Yes  SHORT TERM GOALS: Target date: 03/18/23  Ind with initial HEP Baseline: Goal status: MET  2.  Pt will report 25% less back pain Baseline:  Goal status: In progress  LONG TERM GOALS: Target date: 05/13/23  Pt will be independent with advanced HEP to maintain improvements made throughout therapy  Baseline:  Goal status: Ongoing  2.  Pt will report 75% reduction of pain due to improvements in posture, strength, and  muscle length  Baseline:  Goal status: INITIAL  3.  Pt will have reduced DRA to less than 1 finger width for improved functional stability Baseline:  Goal status: Ongoing  4.  Pt will have 5/5 bil hip strength for improved functional activities and standing without pain Baseline:  Goal status: Ongoing   PLAN:  PT FREQUENCY: 1-2x/week  PT DURATION: 12 weeks  PLANNED INTERVENTIONS: Therapeutic exercises, Therapeutic activity, Neuromuscular re-education, Balance training, Gait training, Patient/Family education, Self Care, Joint mobilization, Dry Needling, Electrical stimulation, Spinal manipulation, Spinal mobilization, Cryotherapy, Moist heat, Taping, Traction, Ultrasound, Biofeedback, Manual therapy, and Re-evaluation  PLAN FOR NEXT SESSION: manual treatments for pain management lumbar and gluteals, assess response to dry needling/manual   Woody Kronberg, PT 03/14/2023, 8:55 AM   [facility] [address] [place], [place] [address] Phone # [phone] Fax [phone]    PHYSICAL THERAPY DISCHARGE SUMMARY  As of 09/16/2023, patient has not  returned for further PT visits and discharge at this time.  Patient agrees to discharge. Patient goals were partially met. Patient is being discharged due to not returning since the last visit.  Clydie Braun Nyiesha Beever, PT, DPT 09/16/23, 10:14 AM

## 2023-03-14 NOTE — Patient Instructions (Signed)

## 2023-03-14 NOTE — Telephone Encounter (Signed)
Noted. Patient preference to keep or cancel appointment scheduled for 03/15/2023.

## 2023-03-15 ENCOUNTER — Telehealth: Payer: Self-pay | Admitting: Family

## 2023-03-15 ENCOUNTER — Encounter: Payer: Self-pay | Admitting: Family

## 2023-03-15 ENCOUNTER — Ambulatory Visit: Payer: Medicaid Other | Admitting: Family

## 2023-03-15 VITALS — BP 144/84 | HR 63 | Temp 98.1°F | Ht 69.0 in | Wt 197.6 lb

## 2023-03-15 DIAGNOSIS — I10 Essential (primary) hypertension: Secondary | ICD-10-CM | POA: Diagnosis not present

## 2023-03-15 DIAGNOSIS — Z5941 Food insecurity: Secondary | ICD-10-CM | POA: Diagnosis not present

## 2023-03-15 DIAGNOSIS — N898 Other specified noninflammatory disorders of vagina: Secondary | ICD-10-CM | POA: Diagnosis not present

## 2023-03-15 MED ORDER — VALSARTAN 80 MG PO TABS: 80.0000 mg | ORAL_TABLET | Freq: Every day | ORAL | 1 refills | Status: DC

## 2023-03-15 NOTE — Progress Notes (Signed)
Patient ID: Janice Stewart, female    DOB: [birth year]  MRN: [id number]  CC: Urgent Care Follow-Up  Subjective: Janice Stewart is a 37 y.o. female who presents for Urgent Care follow-up.   Her concerns today include:  03/08/2023 [facility]) per NP note: Initial Impression / Assessment and Plan / UC Course  I have reviewed the triage vital signs and the nursing notes.   Pertinent labs & imaging results that were available during my care of the patient were reviewed by me and considered in my medical decision making (see chart for details).     1.  Vaginal lesion   Differential diagnoses for vaginal lesion include cyst versus lipoma.  There is no signs of infection on exam so abscess does not appear to be present.  It also does not appear to be consistent with lymph node swelling.  Advised patient ultrasound of the area would be reasonable to determine exact etiology which cannot be performed or ordered here ar urgent care.  Encouraged follow-up with gynecology and/or family medicine to have this performed.  She voiced understanding of this.   2.  Vaginal spotting   Patient reports vaginal spotting is minimal so no need for emergent evaluation or CBC at this time.  Urine pregnancy test was completed that was negative.  UA also completely unremarkable.  Will send cervicovaginal swab to test for any for vaginitis that could be causing symptoms.  Awaiting results.  Given no pelvic pain or any associated symptoms, pelvic exam was deferred.  Patient encouraged to follow-up with her gynecologist for further evaluation and management of this.  Advised strict return precautions.  Patient verbalized understanding and was agreeable with plan.  Follow-Ups: Follow up with [facility] (Obstetrics and Gynecology)   Today's visit 03/15/2023: - Reports she tried to schedule an appointment with  her established Gynecologist but they do not have any appointments available until September 2024. Reports new Gynecology referral has not called her to schedule an appointment as of present.  - Reports she is established with Gynecology Oncology for history of gestational choriocarcinoma. Reports currently having intermittent generalized body pain and thinks related to the same. She denies recent trauma/injury and red flag symptoms. Reports she is not presently working. States she is considering applying for disability. Gynecology Oncology will assist with required paperwork. - Reports she tried to apply for food assistance at [facility] and was denied. Reports she was told her husband makes too much money.  - Doing well on Valsartan, no issues/concerns. She does not complain of red flag symptoms such as but not limited to chest pain, shortness of breath, worst headache of life, nausea/vomiting.  - No further issues/concerns for discussion today.   Patient Active Problem List   Diagnosis Date Noted   Low back pain 03/26/2022   Bone infarct (HCC) 02/22/2022   Simple obesity 09/22/2021   Sickle cell trait (HCC) 02/22/2020   Gestational choriocarcinoma (HCC) in 2017 01/25/2020   History of CVA (cerebrovascular accident) 01/25/2020     Current Outpatient Medications on File Prior to Visit  Medication Sig Dispense Refill   ASHWAGANDHA PO Take by mouth.     Cholecalciferol (VITAMIN D3) 50 MCG (2000 UT) CHEW Chew 50 mcg by mouth daily.     folic acid (FOLVITE) 1 MG tablet Take 1 tablet (1 mg total) by mouth daily. 30 tablet 11  minoxidil (LONITEN) 2.5 MG tablet Take 1 tablet (2.5 mg total) by mouth daily. 30 tablet 5   medroxyPROGESTERone (DEPO-PROVERA) 150 MG/ML injection Inject 150 mg into the muscle every 3 (three) months. (Patient not taking: Reported on 03/15/2023)     No current facility-administered medications on file prior to visit.    No Known Allergies  Social History    Socioeconomic History   Marital status: Single    Spouse name: Not on file   Number of children: Not on file   Years of education: Not on file   Highest education level: Not on file  Occupational History   Not on file  Tobacco Use   Smoking status: Never    Passive exposure: Never   Smokeless tobacco: Never  Vaping Use   Vaping status: Never Used  Substance and Sexual Activity   Alcohol use: Not Currently   Drug use: Never   Sexual activity: Yes    Birth control/protection: Injection    Comment: DEPO-PROVERA  Other Topics Concern   Not on file  Social History Narrative   Not on file   Social Determinants of Health   Financial Resource Strain: Not on file  Food Insecurity: No Food Insecurity (08/01/2022)   Hunger Vital Sign    Worried About Running Out of Food in the Last Year: Never true    Ran Out of Food in the Last Year: Never true  Transportation Needs: No Transportation Needs (08/01/2022)   PRAPARE - Administrator, Civil Service (Medical): No    Lack of Transportation (Non-Medical): No  Physical Activity: Not on file  Stress: Not on file  Social Connections: Not on file  Intimate Partner Violence: Not on file    Family History  Problem Relation Age of Onset   Varicose Veins Mother    Hypertension Father    Cancer Father        prostate    Past Surgical History:  Procedure Laterality Date   LIVER BIOPSY  2017   LUNG BIOPSY  2017    ROS: Review of Systems Negative except as stated above  PHYSICAL EXAM: BP (!) 144/84   Pulse 63   Temp 98.1 F (36.7 C) (Oral)   Ht 5\' 9"  (1.753 m)   Wt 197 lb 9.6 oz (89.6 kg)   LMP 02/16/2023 (Approximate)   SpO2 96%   BMI 29.18 kg/m   Physical Exam HENT:     Head: Normocephalic and atraumatic.     Nose: Nose normal.     Mouth/Throat:     Mouth: Mucous membranes are moist.     Pharynx: Oropharynx is clear.  Eyes:     Extraocular Movements: Extraocular movements intact.      Conjunctiva/sclera: Conjunctivae normal.     Pupils: Pupils are equal, round, and reactive to light.  Cardiovascular:     Rate and Rhythm: Normal rate and regular rhythm.     Pulses: Normal pulses.     Heart sounds: Normal heart sounds.  Pulmonary:     Effort: Pulmonary effort is normal.     Breath sounds: Normal breath sounds.  Musculoskeletal:        General: Normal range of motion.     Cervical back: Normal range of motion and neck supple.  Neurological:     General: No focal deficit present.     Mental Status: She is alert and oriented to person, place, and time.  Psychiatric:        Mood  and Affect: Mood normal.        Behavior: Behavior normal.     ASSESSMENT AND PLAN: 1. Vaginal lesion - Patient provided with referral contact information to Gynecology. Patient aware to notify primary provider if she experiences difficulty scheduling an appointment.  2. Primary hypertension - Blood pressure not at goal during today's visit. Patient asymptomatic without chest pressure, chest pain, palpitations, shortness of breath, worst headache of life, and any additional red flag symptoms. - Increase Valsartan from 40 mg daily to 80 mg daily.  - Counseled on blood pressure goal of less than 130/80, low-sodium, DASH diet, medication compliance, and 150 minutes of moderate intensity exercise per week as tolerated. Counseled on medication adherence and adverse effects. - Follow-up with primary provider in 2 weeks or sooner if needed for blood pressure check. - valsartan (DIOVAN) 80 MG tablet; Take 1 tablet (80 mg total) by mouth daily.  Dispense: 30 tablet; Refill: 1  3. Food insecurity - Message sent to our office social worker.    Patient was given the opportunity to ask questions.  Patient verbalized understanding of the plan and was able to repeat key elements of the plan. Patient was given clear instructions to go to Emergency Department or return to medical center if symptoms don't  improve, worsen, or new problems develop.The patient verbalized understanding.   Requested Prescriptions   Signed Prescriptions Disp Refills   valsartan (DIOVAN) 80 MG tablet 30 tablet 1    Sig: Take 1 tablet (80 mg total) by mouth daily.    Return in about 2 weeks (around 03/29/2023) for Follow-Up or next available blood pressure check.  Rema Fendt, NP

## 2023-03-15 NOTE — Progress Notes (Signed)
Pt asking for referral for ultrasound

## 2023-03-20 ENCOUNTER — Telehealth: Payer: Self-pay | Admitting: Licensed Clinical Social Worker

## 2023-03-20 NOTE — Telephone Encounter (Signed)
Noted  

## 2023-03-20 NOTE — Telephone Encounter (Signed)
Received

## 2023-03-20 NOTE — Telephone Encounter (Signed)
[facility] contacted pt in regards to a referral from PCP about pt needing food resources. Pt stated that she receives [facility] but is unable to work and a needs assistance. Pt allowed me to share information with her in MyChart messenger.

## 2023-03-21 ENCOUNTER — Ambulatory Visit: Payer: Medicaid Other

## 2023-04-23 ENCOUNTER — Encounter: Payer: Self-pay | Admitting: Obstetrics and Gynecology

## 2023-04-23 ENCOUNTER — Other Ambulatory Visit: Payer: Self-pay

## 2023-04-23 ENCOUNTER — Encounter: Payer: Medicaid Other | Admitting: Advanced Practice Midwife

## 2023-04-23 ENCOUNTER — Ambulatory Visit (INDEPENDENT_AMBULATORY_CARE_PROVIDER_SITE_OTHER): Payer: Medicaid Other | Admitting: Obstetrics and Gynecology

## 2023-04-23 VITALS — BP 138/82 | HR 86 | Wt 194.9 lb

## 2023-04-23 DIAGNOSIS — K4030 Unilateral inguinal hernia, with obstruction, without gangrene, not specified as recurrent: Secondary | ICD-10-CM

## 2023-04-23 NOTE — Progress Notes (Signed)
NEW GYNECOLOGY PATIENT Patient name: Janice Stewart MRN [id number]  Date of birth: [birth year] Chief Complaint:   Gynecologic Exam     History:  Janice Stewart is a 37 y.o. [id number] being seen today for vulvar lesion >1 month. Non-painful, non-draining lesion.  Noticed it while sitting on her bathtub and noted bump on mons. No pain with intercourse. No skin changes - feels deep to the skin. Wants to be sure it's ok since previously diagnosed with cancer.        Gynecologic History No LMP recorded. (Menstrual status: IUD). Contraception: IUD Last Pap:     Component Value Date/Time   DIAGPAP  06/26/2021 1646    - Negative for intraepithelial lesion or malignancy (NILM)   DIAGPAP  01/25/2020 1416    - Negative for intraepithelial lesion or malignancy (NILM)   HPVHIGH Negative 06/26/2021 1646   HPVHIGH Negative 01/25/2020 1416   ADEQPAP  06/26/2021 1646    Satisfactory for evaluation; transformation zone component PRESENT.   ADEQPAP  01/25/2020 1416    Satisfactory for evaluation; transformation zone component PRESENT.    High Risk HPV: Positive  Adequacy:  Satisfactory for evaluation, transformation zone component PRESENT  Diagnosis:  Atypical squamous cells of undetermined significance (ASC-US)   Obstetric History OB History  Gravida Para Term Preterm AB Living  4 2 2   1 2   SAB IAB Ectopic Multiple Live Births  1     0 2    # Outcome Date GA Lbr Len/2nd Weight Sex Type Anes PTL Lv  4 Gravida           3 Term 07/18/20 [redacted]w[redacted]d 05:25 / 02:29 9 lb 5.2 oz (4.23 kg) M Vag-Spont EPI  LIV     Birth Comments: none  2 Term 53 [redacted]w[redacted]d    Vag-Spont  N   1 SAB             Past Medical History:  Diagnosis Date   Cancer (HCC)    Hypertension    Sickle cell trait (HCC)    Stroke [facility])     Past Surgical History:  Procedure Laterality Date   LIVER BIOPSY  2017   LUNG BIOPSY  2017    Current Outpatient Medications on File Prior to Visit  Medication Sig  Dispense Refill   ASHWAGANDHA PO Take by mouth.     Cholecalciferol (VITAMIN D3) 50 MCG (2000 UT) CHEW Chew 50 mcg by mouth daily.     folic acid (FOLVITE) 1 MG tablet Take 1 tablet (1 mg total) by mouth daily. 30 tablet 11   minoxidil (LONITEN) 2.5 MG tablet Take 1 tablet (2.5 mg total) by mouth daily. 30 tablet 5   valsartan (DIOVAN) 80 MG tablet Take 1 tablet (80 mg total) by mouth daily. 30 tablet 1   medroxyPROGESTERone (DEPO-PROVERA) 150 MG/ML injection Inject 150 mg into the muscle every 3 (three) months. (Patient not taking: Reported on 03/15/2023)     No current facility-administered medications on file prior to visit.    No Known Allergies  Social History:  reports that she has never smoked. She has never been exposed to tobacco smoke. She has never used smokeless tobacco. She reports that she does not currently use alcohol. She reports that she does not use drugs.  Family History  Problem Relation Age of Onset   Varicose Veins Mother    Hypertension Father    Cancer Father  prostate    The following portions of the patient's history were reviewed and updated as appropriate: allergies, current medications, past family history, past medical history, past social history, past surgical history and problem list.  Review of Systems Pertinent items noted in HPI and remainder of comprehensive ROS otherwise negative.  Physical Exam:  BP 138/82   Pulse 86   Wt 194 lb 14.4 oz (88.4 kg)   BMI 28.78 kg/m  Physical Exam Vitals and nursing note reviewed. Exam conducted with a chaperone present.  Constitutional:      Appearance: Normal appearance.  Cardiovascular:     Rate and Rhythm: Normal rate.  Pulmonary:     Effort: Pulmonary effort is normal.     Breath sounds: Normal breath sounds.  Genitourinary:    Comments: When lying down, normal appearing mons without palpable mass When standing, noted to have swelling/mass in mons, ~3cm on left side - nontender  Neurological:      General: No focal deficit present.     Mental Status: She is alert and oriented to person, place, and time.  Psychiatric:        Mood and Affect: Mood normal.        Behavior: Behavior normal.        Thought Content: Thought content normal.        Judgment: Judgment normal.        Assessment and Plan:   1. Unilateral inguinal hernia with obstruction and without gangrene, recurrence not specified Soft, mobile mass in mons, suspect possible inguinal hernia. Non-fixed and nontender, less concerning for met from recurrence. Will obtain CT to better characterize region and referral to general surgery if hernia confirmed.  - CT PELVIS WO CONTRAST; Future  Routine preventative health maintenance measures emphasized. Please refer to After Visit Summary for other counseling recommendations.   Follow-up: No follow-ups on file.      Lorriane Shire, MD Obstetrician & Gynecologist, Faculty Practice [facility] for [employer], [facility]

## 2023-04-26 ENCOUNTER — Ambulatory Visit (HOSPITAL_BASED_OUTPATIENT_CLINIC_OR_DEPARTMENT_OTHER): Payer: Medicaid Other

## 2023-04-30 ENCOUNTER — Ambulatory Visit (HOSPITAL_BASED_OUTPATIENT_CLINIC_OR_DEPARTMENT_OTHER): Payer: Medicaid Other

## 2023-05-06 ENCOUNTER — Telehealth: Payer: Self-pay

## 2023-05-06 NOTE — Telephone Encounter (Signed)
Called pt to review; appt scheduled for this Friday, 9:45 AM arrival. Centralized scheduling given PA info.

## 2023-05-06 NOTE — Telephone Encounter (Addendum)
-----   Message from Raquel James sent at 05/03/2023 10:32 AM EDT ----- Regarding: RE: CT PA? Dr. Briscoe Deutscher had to do a Peer-to-Peer for this patient. The approval number [id number] and valid from 9/11-11/9. ----- Message ----- From: Marjo Bicker, RN Sent: 05/03/2023  10:14 AM EDT To: Oleh Genin Subject: CT PA?                                         I just found another one! This patient was scheduled for CT on 9/13 and then rescheduled to 9/17 which was ultimately cancelled due to no preauth. Did you receive a notification about this?

## 2023-05-10 ENCOUNTER — Ambulatory Visit (HOSPITAL_COMMUNITY)
Admission: RE | Admit: 2023-05-10 | Discharge: 2023-05-10 | Disposition: A | Discharge status: Home / Self Care | Payer: Medicaid Other | Source: Ambulatory Visit | Attending: Obstetrics and Gynecology | Admitting: Obstetrics and Gynecology

## 2023-05-10 DIAGNOSIS — N3289 Other specified disorders of bladder: Secondary | ICD-10-CM | POA: Diagnosis not present

## 2023-05-10 DIAGNOSIS — K4090 Unilateral inguinal hernia, without obstruction or gangrene, not specified as recurrent: Secondary | ICD-10-CM | POA: Diagnosis not present

## 2023-05-10 DIAGNOSIS — K429 Umbilical hernia without obstruction or gangrene: Secondary | ICD-10-CM | POA: Diagnosis not present

## 2023-05-10 DIAGNOSIS — K4030 Unilateral inguinal hernia, with obstruction, without gangrene, not specified as recurrent: Secondary | ICD-10-CM | POA: Insufficient documentation

## 2023-05-14 DIAGNOSIS — K4090 Unilateral inguinal hernia, without obstruction or gangrene, not specified as recurrent: Secondary | ICD-10-CM

## 2023-05-14 DIAGNOSIS — K429 Umbilical hernia without obstruction or gangrene: Secondary | ICD-10-CM

## 2023-05-14 HISTORY — DX: Unilateral inguinal hernia, without obstruction or gangrene, not specified as recurrent: K40.90

## 2023-05-14 HISTORY — DX: Umbilical hernia without obstruction or gangrene: K42.9

## 2023-05-16 ENCOUNTER — Encounter: Payer: Self-pay | Admitting: Obstetrics and Gynecology

## 2023-05-16 ENCOUNTER — Other Ambulatory Visit: Payer: Self-pay | Admitting: Family

## 2023-05-16 DIAGNOSIS — I10 Essential (primary) hypertension: Secondary | ICD-10-CM

## 2023-05-16 NOTE — Telephone Encounter (Signed)
Requested Prescriptions  Pending Prescriptions Disp Refills   valsartan (DIOVAN) 80 MG tablet [Pharmacy Med Name: Valsartan 80 MG Oral Tablet] 30 tablet 0    Sig: Take 1 tablet by mouth once daily     Cardiovascular:  Angiotensin Receptor Blockers Failed - 05/16/2023  9:15 AM      Failed - Cr in normal range and within 180 days    Creatinine, Ser  Date Value Ref Range Status  05/24/2022 0.95 0.57 - 1.00 mg/dL Final         Failed - K in normal range and within 180 days    Potassium  Date Value Ref Range Status  05/24/2022 4.5 3.5 - 5.2 mmol/L Final         Passed - Patient is not pregnant      Passed - Last BP in normal range    BP Readings from Last 1 Encounters:  04/23/23 138/82         Passed - Valid encounter within last 6 months    Recent Outpatient Visits           2 months ago Vaginal lesion   [facility], [place], [place]   9 months ago Primary hypertension   [facility], [place], [place]   11 months ago Primary hypertension   [facility], [place], NP   1 year ago Annual physical exam   [facility], Amy J, NP   1 year ago Essential hypertension   [facility], [place], Salomon Fick, NP       Future Appointments             In 2 months Deirdre Evener, MD [facility]

## 2023-06-04 ENCOUNTER — Other Ambulatory Visit: Payer: Self-pay | Admitting: Obstetrics and Gynecology

## 2023-06-04 DIAGNOSIS — K4030 Unilateral inguinal hernia, with obstruction, without gangrene, not specified as recurrent: Secondary | ICD-10-CM

## 2023-06-12 ENCOUNTER — Encounter: Payer: Self-pay | Admitting: Surgery

## 2023-06-12 ENCOUNTER — Telehealth: Payer: Self-pay | Admitting: Surgery

## 2023-06-12 ENCOUNTER — Ambulatory Visit: Payer: Medicaid Other | Admitting: Surgery

## 2023-06-12 VITALS — BP 120/78 | HR 77 | Temp 98.5°F | Ht 69.0 in | Wt 192.6 lb

## 2023-06-12 DIAGNOSIS — K429 Umbilical hernia without obstruction or gangrene: Secondary | ICD-10-CM | POA: Diagnosis not present

## 2023-06-12 DIAGNOSIS — M6208 Separation of muscle (nontraumatic), other site: Secondary | ICD-10-CM

## 2023-06-12 DIAGNOSIS — K4090 Unilateral inguinal hernia, without obstruction or gangrene, not specified as recurrent: Secondary | ICD-10-CM

## 2023-06-12 NOTE — Telephone Encounter (Signed)
Patient has been advised of Pre-Admission date/time, and Surgery date at [facility].  Surgery Date: 06/20/23 Preadmission Testing Date: 06/18/23 (phone 1p-4p)  Patient has been made aware to call [phone], between 1-3:00pm the day before surgery, to find out what time to arrive for surgery.

## 2023-06-12 NOTE — Patient Instructions (Addendum)
Ha elegido que le reparen la hernia. Esto lo har el Dr. Aleen Campi en [facility].  Consulte la informacin de atencin previa (azul) que le han proporcionado hoy. Nuestro programador de [place] lo llamar para verificar la fecha de la ciruga y repasar la informacin.   Deber hacer arreglos para estar sin trabajo durante aproximadamente 1 a 2 semanas y luego podr regresar con una restriccin de levantamiento de pesas durante 4 semanas ms. Si tiene documentacin [employer] o [employer] que debe completarse, pdale a su empresa que enve su documentacin por fax al [phone] o puede entregarla en cualquiera de las oficinas. Esta documentacin se completar dentro de los [address] a que se haya completado su [place].  Es posible que tenga un hematoma en la ingle y tambin hinchazn y hematomas en el rea de los testculos. Puede usar hielo de 4 a 5 veces al da durante 15 a 20 minutos cada vez. Asegrese de colocar una barrera entre usted y la bolsa de hielo. Para disminuir la hinchazn, puede enrollar una toalla de bao y colocarla verticalmente entre los muslos con los testculos apoyados sobre la toalla. Querr mantener esta rea elevada tanto como sea posible durante varios das despus de la [place].  Hernia inguinal, adulto  Los msculos ayudan a Pharmacologist todo el cuerpo en su lugar adecuado. Pero si se desarrolla un punto dbil en los msculos, algo puede asomar. Eso se llama hernia. Cuando esto sucede en la parte baja del vientre (abdomen), se llama hernia inguinal. (Toma su nombre de una parte del cuerpo en esta regin llamada canal inguinal). Un punto dbil en la pared de los msculos deja pasar algo de grasa o parte del intestino delgado. Una hernia inguinal puede desarrollarse a cualquier edad. Los hombres los padecen con ms frecuencia que las mujeres. CAUSAS  En los adultos, una hernia inguinal se desarrolla con el tiempo. ? Puede ser desencadenado por:  ? Forzar repentinamente los  msculos de la parte inferior del abdomen.  ? Levantar objetos pesados.  ? Esforzarse para defecar. Esto puede provocar dificultad para defecar (estreimiento).  ? Tos constante. Esto puede deberse al tabaquismo o a una enfermedad pulmonar.  ? Tener sobrepeso.  ? Estar embarazada.  ? Printmaker en un trabajo que requiere Personal assistant de pie durante largos perodos o levantar objetos pesados.  ? Haber tenido una hernia inguinal anteriormente. Un tipo puede ser una situacin de emergencia. Se llama hernia inguinal estrangulada. Se desarrolla si parte del intestino delgado se desliza a travs del punto dbil y no puede regresar al abdomen. Se puede cortar el suministro de [place]. Si eso sucede, parte del intestino puede morir. Esta situacin requiere [place] de emergencia.  SNTOMAS  A menudo, una pequea hernia inguinal no presenta sntomas. Se encuentra cuando un proveedor de atencin mdica realiza un examen fsico. Las hernias ms grandes suelen presentar sntomas.  ? En adultos, los sntomas pueden incluir:  ? Un bulto en la ingle. Esto es ms fcil de ver cuando la persona est de pie. Podra desaparecer al Arsenio Loader.  ? En los hombres, un bulto en el escroto.  ? Dolor o ardor en la ingle. Esto ocurre especialmente al levantar objetos, hacer esfuerzos o toser.  ? Un dolor sordo o sensacin de presin en la ingle. ? Los signos de una hernia estrangulada pueden incluir:  ? Un bulto en la ingle que se vuelve muy doloroso y sensible al tacto.  ? Un bulto que se vuelve rojo o morado.  ?  Grant Ruts, nuseas y vmitos.  ? Incapacidad para defecar o expulsar gases. DIAGNSTICO  Para decidir si tiene una hernia inguinal, probablemente un proveedor de Materials engineer un examen fsico. ? Esto incluir hacer preguntas sobre cualquier sntoma que haya notado. ? Es posible que el proveedor de atencin mdica palpe el rea de la ingle y le pida que tosa. Si se palpa una hernia inguinal, el  mdico puede intentar deslizarla nuevamente hacia el abdomen. ? Generalmente no se necesitan otras pruebas. TRATAMIENTO  Los tratamientos IT consultant. El tamao de la hernia marca la diferencia. Las opciones incluyen: ? Multimedia programmer. Esto suele sugerirse si la hernia es pequea y usted no ha tenido sntomas.  ? No se realizar ningn procedimiento mdico a menos que se desarrollen sntomas.  ? Deber estar atento a los sntomas. Si ocurre algo, comunquese con su proveedor de atencin mdica de inmediato. ? Ciruga. Esto se [place] si la hernia es ms grande o si tiene sntomas.  ? [place]. Este suele ser un procedimiento ambulatorio (no pasar la noche en un hospital). Se hace un corte (incisin) a travs de la piel de la ingle. La hernia se vuelve a colocar dentro del abdomen. Luego, el rea dbil de los msculos se repara mediante herniorrafia o hernioplastia. Herniorrafia: en este tipo de [place], los msculos dbiles se vuelven a coser. Hernioplastia: se utiliza un parche o malla para cerrar la zona dbil de la pared abdominal.  ? Laparoscopia. En este procedimiento, un cirujano realiza pequeas incisiones. Se introduce en el abdomen un tubo delgado con una pequea cmara de video (llamado laparoscopio). El cirujano repara la hernia con malla mirando con la cmara de video y [address] instrumentos largos.  INSTRUCCIONES DE CUIDADO EN EL HOGAR  ? Despus de la ciruga para reparar una hernia inguinal:  ? Deber tomar analgsicos recetados por su proveedor de Psychologist, prison and probation services. Siga todas las instrucciones cuidadosamente.  ? Deber cuidar la herida de la incisin.  ? Tu actividad estar restringida por un tiempo. Esto probablemente incluir no levantar objetos pesados durante varias semanas. Tampoco deberas hacer nada demasiado activo durante algunas semanas. El momento en que podr volver a Proofreader del tipo de trabajo que tenga. ? Durante los perodos de "espera vigilante",  usted debe:  ? Mantenga un peso saludable.  ? Consuma una dieta rica en fibra (frutas, verduras y cereales integrales).  ? Beba muchos lquidos para evitar el estreimiento. Esto significa beber suficiente agua y otros lquidos para mantener la orina clara o de color amarillo plido.  ? No levante objetos pesados.  ? No permanezca de pie durante largos perodos de tiempo.  ? Deja de fumar. Esto debera evitar que desarrolle tos frecuente. BUSQUE ATENCIN MDICA SI:  ? Se desarrolla un bulto en el rea de la ingle. ? Sientes dolor, sensacin de ardor o presin en la ingle. Esto podra ser peor si est levantando objetos o haciendo esfuerzo. ? Tiene fiebre de ms de 100,5 F (38,1 C). BUSQUE ATENCIN MDICA INMEDIATA SI:  ? El dolor en la ingle aumenta repentinamente. ? Un bulto en la ingle aumenta repentinamente y no baja. ? En los hombres, hay un dolor repentino en el escroto. O aumenta el tamao del escroto. ? Un bulto en el rea de la ingle se vuelve rojo o morado y duele al tocarlo. ? Tiene nuseas o vmitos que no desaparecen. ? Sientes que tu corazn late mucho ms rpido de lo normal. ? No puede defecar ni  expulsar gases. ? Tiene fiebre de ms de 102,0 F (38,9 C). Esta informacin no pretende reemplazar los consejos que le brinde su proveedor de Psychologist, prison and probation services. Asegrese de discutir cualquier pregunta que tenga con su proveedor de Psychologist, prison and probation services.   Documento publicado: 01/15/2009 Documento revisado: 06/15/2012 Documento revisado: 20/01/2015 Educacin interactiva para [place] [employer].

## 2023-06-12 NOTE — Progress Notes (Unsigned)
06/12/2023  Reason for Visit:  Left inguinal hernia, umbilical hernia, diastasis recti  Requesting Provider:  Lorriane Shire, MD  History of Present Illness: Janice Stewart is a 37 y.o. female presenting for evaluation of a left inguinal hernia as well as an umbilical hernia and diastases recti.  The patient reports that with her first pregnancy, she had no residual issues in her abdominal wall but after her second pregnancy, she did develop diastases.  More recently over the last 2 months, she has noticed a bulging in the left medial groin.  There is also mild discomfort but no worsening pain.  This is more noticeable when she is standing or doing any strenuous activity, but when she lies down, the bulging disappears.  She has been doing core exercises to strengthen her core muscles due to her diastases but has noticed also bulging around her umbilicus and in the groin when doing exercises.  Overall denies any worsening abdominal pain at the umbilical area, denies any nausea, vomiting, constipation.  She had a CT scan of her pelvis on 05/10/2023 which showed a small left inguinal hernia containing fat and the possibility of very small right inguinal hernia also containing fat, as well as a potential umbilical hernia with diastases recti.  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  Past Medical History: Past Medical History:  Diagnosis Date   Cancer (HCC)    Hypertension    Sickle cell trait (HCC)    Stroke [facility])      Past Surgical History: Past Surgical History:  Procedure Laterality Date   LIVER BIOPSY  2017   LUNG BIOPSY  2017    Home Medications: Prior to Admission medications   Medication Sig Start Date End Date Taking? Authorizing Provider  Cholecalciferol (VITAMIN D3) 50 MCG (2000  UT) CHEW Chew 2,000 Units by mouth in the morning.   Yes [provider]  folic acid (FOLVITE) 1 MG tablet Take 1 tablet (1 mg total) by mouth daily. 07/10/22 07/10/23 Yes Massie Maroon, FNP  minoxidil (LONITEN) 2.5 MG tablet Take 1 tablet (2.5 mg total) by mouth daily. 01/24/23  Yes Deirdre Evener, MD  valsartan (DIOVAN) 80 MG tablet Take 1 tablet by mouth once daily 05/16/23  Yes Zonia Kief, Amy J, NP  acetaminophen (TYLENOL) 500 MG tablet Take 1,000 mg by mouth every 6 (six) hours as needed (pain.).    [provider]  BIOTIN PO Take 1 tablet by mouth daily.    [provider]  ibuprofen (ADVIL) 200 MG tablet Take 400 mg by mouth every 8 (eight) hours as needed (pain.).    [provider]  Multiple Vitamin (MULTIVITAMIN WITH MINERALS) TABS tablet Take 1 tablet by mouth in the morning.    [provider]    Allergies: No Known Allergies  Social History:  reports that she has never smoked. She has never been exposed to tobacco smoke. She has never used smokeless tobacco. She reports that she does not currently use alcohol. She reports that she does not use drugs.   Family History: Family History  Problem Relation Age of Onset   Varicose Veins Mother    Hypertension Father    Cancer Father        prostate    Review of Systems: Review of Systems  Constitutional:  Negative for chills and fever.  HENT:  Negative for hearing loss.   Respiratory:  Negative for shortness of breath.   Cardiovascular:  Negative for chest pain.  Gastrointestinal:  Positive for abdominal pain (discomfort left groin). Negative for nausea and vomiting.  Genitourinary:  Negative for dysuria.  Musculoskeletal:  Negative for myalgias.  Skin:  Negative for rash.  Neurological:  Negative for dizziness.  Psychiatric/Behavioral:  Negative for depression.     Physical Exam BP 120/78 (BP Location: Left Arm, Patient Position: Sitting, Cuff Size: Large)   Pulse 77    Temp 98.5 F (36.9 C) (Oral)   Ht 5\' 9"  (1.753 m)   Wt 192 lb 9.6 oz (87.4 kg)   SpO2 98%   BMI 28.44 kg/m  CONSTITUTIONAL: No acute distress, well nourished. HEENT:  Normocephalic, atraumatic, extraocular motion intact. NECK: Trachea is midline, and there is no jugular venous distension.  RESPIRATORY:  Lungs are clear, and breath sounds are equal bilaterally. Normal respiratory effort without pathologic use of accessory muscles. CARDIOVASCULAR: Heart is regular without murmurs, gallops, or rubs. GI: The abdomen is soft, non-distended, non-tender to palpation.  The patient has a small but reducible left inguinal hernia.  No distinct palpable right inguinal hernia.  Patient has an umbilical hernia in the setting of diastasis recti.  The diastasis separation is about 5.5 cm wide. MUSCULOSKELETAL:  Normal muscle strength and tone in all four extremities.  No peripheral edema or cyanosis. SKIN: Skin turgor is normal. There are no pathologic skin lesions.  NEUROLOGIC:  Motor and sensation is grossly normal.  Cranial nerves are grossly intact. PSYCH:  Alert and oriented to person, place and time. Affect is normal.  Laboratory Analysis: Labs from 04/03/23: Sodium 138, potassium 4.2, chloride 104, CO2 25, BUN 18, creatinine 1.0.  Total bilirubin 0.5, AST 27, ALT 19, alkaline phosphatase 51, albumin 4.4.  WBC 8.0, hemoglobin 11.6, hematocrit 34, platelets 262.  Imaging: CT pelvis on 05/10/2023: IMPRESSION: 1. Small left inguinal hernia containing fat. Probable tiny right inguinal hernia containing fat. 2. Laxity along the anterior abdominal wall near the umbilicus. Findings could represent a combination of rectus diastasis with a small umbilical hernia. 3. 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  Assessment and Plan: This is a 37 y.o. female with a left inguinal hernia, umbilical hernia, and diastases recti.  - Discussed with the patient the findings on exam and her CT scan.  On exam,  she does have a left inguinal hernia, and umbilical hernia, and diastases recti with separation of about 5.5 cm.  I am not able to palpate any hernia in the right groin but discussed with her that this could be due to the small size noted on CT scan.  She has not had any symptoms or noticed any bulging of the right groin.  Discussed with the patient that we can certainly repair all 3 issues but based on the types of repairs that are needed and the location of the ports and instruments for each type of repair, it would be better to do these in 2 separate surgeries.  She is in agreement. - Discussed with her then the plan to first work on the inguinal hernia.  The plan will be for robotic assisted left inguinal hernia repair, possible bilateral.  Reviewed with her the surgery at length including the planned incisions, risks of bleeding, infection, injury to surrounding structures, that this will be an outpatient procedure, postoperative activity restrictions, pain control, and she is willing to  proceed.  The second surgery would be a few weeks later which would be a robotic assisted umbilical hernia repair with plication of diastases recti. - Patient will be scheduled for surgery on 06/20/2023.  All of her questions have been answered.  I spent 55 minutes dedicated to the care of this patient on the date of this encounter to include pre-visit review of records, face-to-face time with the patient discussing diagnosis and management, and any post-visit coordination of care.   Howie Ill, MD [facility]

## 2023-06-12 NOTE — H&P (View-Only) (Signed)
06/12/2023  Reason for Visit:  Left inguinal hernia, umbilical hernia, diastasis recti  Requesting Provider:  Lorriane Shire, MD  History of Present Illness: Janice Stewart is a 37 y.o. female presenting for evaluation of a left inguinal hernia as well as an umbilical hernia and diastases recti.  The patient reports that with her first pregnancy, she had no residual issues in her abdominal wall but after her second pregnancy, she did develop diastases.  More recently over the last 2 months, she has noticed a bulging in the left medial groin.  There is also mild discomfort but no worsening pain.  This is more noticeable when she is standing or doing any strenuous activity, but when she lies down, the bulging disappears.  She has been doing core exercises to strengthen her core muscles due to her diastases but has noticed also bulging around her umbilicus and in the groin when doing exercises.  Overall denies any worsening abdominal pain at the umbilical area, denies any nausea, vomiting, constipation.  She had a CT scan of her pelvis on 05/10/2023 which showed a small left inguinal hernia containing fat and the possibility of very small right inguinal hernia also containing fat, as well as a potential umbilical hernia with diastases recti.  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  Past Medical History: Past Medical History:  Diagnosis Date   Cancer (HCC)    Hypertension    Sickle cell trait (HCC)    Stroke [facility])      Past Surgical History: Past Surgical History:  Procedure Laterality Date   LIVER BIOPSY  2017   LUNG BIOPSY  2017    Home Medications: Prior to Admission medications   Medication Sig Start Date End Date Taking? Authorizing Provider  Cholecalciferol (VITAMIN D3) 50 MCG (2000  UT) CHEW Chew 2,000 Units by mouth in the morning.   Yes [provider]  folic acid (FOLVITE) 1 MG tablet Take 1 tablet (1 mg total) by mouth daily. 07/10/22 07/10/23 Yes Massie Maroon, FNP  minoxidil (LONITEN) 2.5 MG tablet Take 1 tablet (2.5 mg total) by mouth daily. 01/24/23  Yes Deirdre Evener, MD  valsartan (DIOVAN) 80 MG tablet Take 1 tablet by mouth once daily 05/16/23  Yes Zonia Kief, Amy J, NP  acetaminophen (TYLENOL) 500 MG tablet Take 1,000 mg by mouth every 6 (six) hours as needed (pain.).    [provider]  BIOTIN PO Take 1 tablet by mouth daily.    [provider]  ibuprofen (ADVIL) 200 MG tablet Take 400 mg by mouth every 8 (eight) hours as needed (pain.).    [provider]  Multiple Vitamin (MULTIVITAMIN WITH MINERALS) TABS tablet Take 1 tablet by mouth in the morning.    [provider]    Allergies: No Known Allergies  Social History:  reports that she has never smoked. She has never been exposed to tobacco smoke. She has never used smokeless tobacco. She reports that she does not currently use alcohol. She reports that she does not use drugs.   Family History: Family History  Problem Relation Age of Onset   Varicose Veins Mother    Hypertension Father    Cancer Father        prostate    Review of Systems: Review of Systems  Constitutional:  Negative for chills and fever.  HENT:  Negative for hearing loss.   Respiratory:  Negative for shortness of breath.   Cardiovascular:  Negative for chest pain.  Gastrointestinal:  Positive for abdominal pain (discomfort left groin). Negative for nausea and vomiting.  Genitourinary:  Negative for dysuria.  Musculoskeletal:  Negative for myalgias.  Skin:  Negative for rash.  Neurological:  Negative for dizziness.  Psychiatric/Behavioral:  Negative for depression.     Physical Exam BP 120/78 (BP Location: Left Arm, Patient Position: Sitting, Cuff Size: Large)   Pulse 77    Temp 98.5 F (36.9 C) (Oral)   Ht 5\' 9"  (1.753 m)   Wt 192 lb 9.6 oz (87.4 kg)   SpO2 98%   BMI 28.44 kg/m  CONSTITUTIONAL: No acute distress, well nourished. HEENT:  Normocephalic, atraumatic, extraocular motion intact. NECK: Trachea is midline, and there is no jugular venous distension.  RESPIRATORY:  Lungs are clear, and breath sounds are equal bilaterally. Normal respiratory effort without pathologic use of accessory muscles. CARDIOVASCULAR: Heart is regular without murmurs, gallops, or rubs. GI: The abdomen is soft, non-distended, non-tender to palpation.  The patient has a small but reducible left inguinal hernia.  No distinct palpable right inguinal hernia.  Patient has an umbilical hernia in the setting of diastasis recti.  The diastasis separation is about 5.5 cm wide. MUSCULOSKELETAL:  Normal muscle strength and tone in all four extremities.  No peripheral edema or cyanosis. SKIN: Skin turgor is normal. There are no pathologic skin lesions.  NEUROLOGIC:  Motor and sensation is grossly normal.  Cranial nerves are grossly intact. PSYCH:  Alert and oriented to person, place and time. Affect is normal.  Laboratory Analysis: Labs from 04/03/23: Sodium 138, potassium 4.2, chloride 104, CO2 25, BUN 18, creatinine 1.0.  Total bilirubin 0.5, AST 27, ALT 19, alkaline phosphatase 51, albumin 4.4.  WBC 8.0, hemoglobin 11.6, hematocrit 34, platelets 262.  Imaging: CT pelvis on 05/10/2023: IMPRESSION: 1. Small left inguinal hernia containing fat. Probable tiny right inguinal hernia containing fat. 2. Laxity along the anterior abdominal wall near the umbilicus. Findings could represent a combination of rectus diastasis with a small umbilical hernia. 3. 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  Assessment and Plan: This is a 37 y.o. female with a left inguinal hernia, umbilical hernia, and diastases recti.  - Discussed with the patient the findings on exam and her CT scan.  On exam,  she does have a left inguinal hernia, and umbilical hernia, and diastases recti with separation of about 5.5 cm.  I am not able to palpate any hernia in the right groin but discussed with her that this could be due to the small size noted on CT scan.  She has not had any symptoms or noticed any bulging of the right groin.  Discussed with the patient that we can certainly repair all 3 issues but based on the types of repairs that are needed and the location of the ports and instruments for each type of repair, it would be better to do these in 2 separate surgeries.  She is in agreement. - Discussed with her then the plan to first work on the inguinal hernia.  The plan will be for robotic assisted left inguinal hernia repair, possible bilateral.  Reviewed with her the surgery at length including the planned incisions, risks of bleeding, infection, injury to surrounding structures, that this will be an outpatient procedure, postoperative activity restrictions, pain control, and she is willing to  proceed.  The second surgery would be a few weeks later which would be a robotic assisted umbilical hernia repair with plication of diastases recti. - Patient will be scheduled for surgery on 06/20/2023.  All of her questions have been answered.  I spent 55 minutes dedicated to the care of this patient on the date of this encounter to include pre-visit review of records, face-to-face time with the patient discussing diagnosis and management, and any post-visit coordination of care.   Howie Ill, MD [facility]

## 2023-06-14 DIAGNOSIS — M6208 Separation of muscle (nontraumatic), other site: Secondary | ICD-10-CM

## 2023-06-14 HISTORY — DX: Separation of muscle (nontraumatic), other site: M62.08

## 2023-06-15 ENCOUNTER — Other Ambulatory Visit: Payer: Self-pay | Admitting: Family

## 2023-06-15 DIAGNOSIS — I10 Essential (primary) hypertension: Secondary | ICD-10-CM

## 2023-06-17 ENCOUNTER — Other Ambulatory Visit: Payer: Self-pay

## 2023-06-17 DIAGNOSIS — I10 Essential (primary) hypertension: Secondary | ICD-10-CM

## 2023-06-17 MED ORDER — VALSARTAN 80 MG PO TABS: 80.0000 mg | ORAL_TABLET | Freq: Every day | ORAL | 0 refills | Status: DC

## 2023-06-18 ENCOUNTER — Other Ambulatory Visit: Payer: Self-pay

## 2023-06-18 ENCOUNTER — Encounter
Admission: RE | Admit: 2023-06-18 | Discharge: 2023-06-18 | Disposition: A | Discharge status: Home / Self Care | Payer: Medicaid Other | Source: Ambulatory Visit | Attending: Surgery | Admitting: Surgery

## 2023-06-18 DIAGNOSIS — Z01812 Encounter for preprocedural laboratory examination: Secondary | ICD-10-CM

## 2023-06-18 DIAGNOSIS — D508 Other iron deficiency anemias: Secondary | ICD-10-CM

## 2023-06-18 DIAGNOSIS — I10 Essential (primary) hypertension: Secondary | ICD-10-CM

## 2023-06-18 HISTORY — DX: Anemia, unspecified: D64.9

## 2023-06-18 HISTORY — DX: Headache, unspecified: R51.9

## 2023-06-18 NOTE — Patient Instructions (Addendum)
Your procedure is scheduled on: 06/20/23 - Thursday Report to the Registration Desk on the 1st floor of the [facility]. To find out your arrival time, please call [phone] between 1PM - 3PM on: 06/19/23 - Wednesday If your arrival time is 6:00 am, do not arrive before that time as the [facility] entrance doors do not open until 6:00 am.  REMEMBER: Instructions that are not followed completely may result in serious medical risk, up to and including death; or upon the discretion of your surgeon and anesthesiologist your surgery may need to be rescheduled.  Do not eat food after midnight the night before surgery.  No gum chewing or hard candies.  You may however, drink CLEAR liquids up to 2 hours before you are scheduled to arrive for your surgery. Do not drink anything within 2 hours of your scheduled arrival time.  Clear liquids include: - water  - apple juice without pulp - gatorade (not RED colors) - black coffee or tea (Do NOT add milk or creamers to the coffee or tea) Do NOT drink anything that is not on this list.  One week prior to surgery: Stop Anti-inflammatories (NSAIDS) such as Advil, Aleve, Ibuprofen, Motrin, Naproxen, Naprosyn and Aspirin based products such as Excedrin, Goody's Powder, BC Powder. You may take Tylenol if needed for pain up until the day of surgery.  Stop ANY OVER THE COUNTER supplements until after surgery : BIOTIN PO , VITAMIN D3 , MULTIVITAMIN , folic acid (FOLVITE) .  HOLD valsartan (DIOVAN) on the day of surgery  ON THE DAY OF SURGERY ONLY TAKE THESE MEDICATIONS WITH SIPS OF WATER:  minoxidil (LONITEN)   No Alcohol for 24 hours before or after surgery.  No Smoking including e-cigarettes for 24 hours before surgery.  No chewable tobacco products for at least 6 hours before surgery.  No nicotine patches on the day of surgery.  Do not use any "recreational" drugs for at least a week (preferably 2 weeks) before your surgery.  Please be  advised that the combination of cocaine and anesthesia may have negative outcomes, up to and including death. If you test positive for cocaine, your surgery will be cancelled.  On the morning of surgery brush your teeth with toothpaste and water, you may rinse your mouth with mouthwash if you wish. Do not swallow any toothpaste or mouthwash.  Use CHG Soap or wipes as directed on instruction sheet.  Do not wear jewelry, make-up, hairpins, clips or nail polish.  For welded (permanent) jewelry: bracelets, anklets, waist bands, etc.  Please have this removed prior to surgery.  If it is not removed, there is a chance that hospital personnel will need to cut it off on the day of surgery.  Do not wear lotions, powders, or perfumes.   Do not shave body hair from the neck down 48 hours before surgery.  Contact lenses, hearing aids and dentures may not be worn into surgery.  Do not bring valuables to the hospital. [facility] is not responsible for any missing/lost belongings or valuables.   Notify your doctor if there is any change in your medical condition (cold, fever, infection).  Wear comfortable clothing (specific to your surgery type) to the hospital.  After surgery, you can help prevent lung complications by doing breathing exercises.  Take deep breaths and cough every 1-2 hours. Your doctor may order a device called an Incentive Spirometer to help you take deep breaths. When coughing or sneezing, hold a pillow firmly against  your incision with both hands. This is called "splinting." Doing this helps protect your incision. It also decreases belly discomfort.  If you are being admitted to the hospital overnight, leave your suitcase in the car. After surgery it may be brought to your room.  In case of increased patient census, it may be necessary for you, the patient, to continue your postoperative care in the Same Day Surgery department.  If you are being discharged the day of surgery,  you will not be allowed to drive home. You will need a responsible individual to drive you home and stay with you for 24 hours after surgery.   If you are taking public transportation, you will need to have a responsible individual with you.  Please call the Pre-admissions Testing Dept. at [phone] if you have any questions about these instructions.  Surgery Visitation Policy:  Patients having surgery or a procedure may have two visitors.  Children under the age of 61 must have an adult with them who is not the patient.  Inpatient Visitation:    Visiting hours are 7 a.m. to 8 p.m. Up to four visitors are allowed at one time in a patient room. The visitors may rotate out with other people during the day.  One visitor age 34 or older may stay with the patient overnight and must be in the room by 8 p.m.     Preparing for Surgery with CHLORHEXIDINE GLUCONATE (CHG) Soap  Chlorhexidine Gluconate (CHG) Soap  o An antiseptic cleaner that kills germs and bonds with the skin to continue killing germs even after washing  o Used for showering the night before surgery and morning of surgery  Before surgery, you can play an important role by reducing the number of germs on your skin.  CHG (Chlorhexidine gluconate) soap is an antiseptic cleanser which kills germs and bonds with the skin to continue killing germs even after washing.  Please do not use if you have an allergy to CHG or antibacterial soaps. If your skin becomes reddened/irritated stop using the CHG.  1. Shower the NIGHT BEFORE SURGERY and the MORNING OF SURGERY with CHG soap.  2. If you choose to wash your hair, wash your hair first as usual with your normal shampoo.  3. After shampooing, rinse your hair and body thoroughly to remove the shampoo.  4. Use CHG as you would any other liquid soap. You can apply CHG directly to the skin and wash gently with a scrungie or a clean washcloth.  5. Apply the CHG soap to your body  only from the neck down. Do not use on open wounds or open sores. Avoid contact with your eyes, ears, mouth, and genitals (private parts). Wash face and genitals (private parts) with your normal soap.  6. Wash thoroughly, paying special attention to the area where your surgery will be performed.  7. Thoroughly rinse your body with warm water.  8. Do not shower/wash with your normal soap after using and rinsing off the CHG soap.  9. Pat yourself dry with a clean towel.  10. Wear clean pajamas to bed the night before surgery.  12. Place clean sheets on your bed the night of your first shower and do not sleep with pets.  13. Shower again with the CHG soap on the day of surgery prior to arriving at the hospital.  14. Do not apply any deodorants/lotions/powders.  15. Please wear clean clothes to the hospital.

## 2023-06-19 ENCOUNTER — Encounter
Admission: RE | Admit: 2023-06-19 | Discharge: 2023-06-19 | Disposition: A | Discharge status: Home / Self Care | Payer: Medicaid Other | Source: Ambulatory Visit | Attending: Surgery | Admitting: Surgery

## 2023-06-19 DIAGNOSIS — Z01818 Encounter for other preprocedural examination: Secondary | ICD-10-CM | POA: Insufficient documentation

## 2023-06-19 DIAGNOSIS — Z01810 Encounter for preprocedural cardiovascular examination: Secondary | ICD-10-CM | POA: Diagnosis not present

## 2023-06-19 DIAGNOSIS — I10 Essential (primary) hypertension: Secondary | ICD-10-CM | POA: Insufficient documentation

## 2023-06-19 DIAGNOSIS — D508 Other iron deficiency anemias: Secondary | ICD-10-CM | POA: Insufficient documentation

## 2023-06-19 DIAGNOSIS — Z01812 Encounter for preprocedural laboratory examination: Secondary | ICD-10-CM | POA: Diagnosis present

## 2023-06-19 LAB — CBC
HCT: 30.9 % — ABNORMAL LOW (ref 36.0–46.0)
Hemoglobin: 10.6 g/dL — ABNORMAL LOW (ref 12.0–15.0)
MCH: 28.3 pg (ref 26.0–34.0)
MCHC: 34.3 g/dL (ref 30.0–36.0)
MCV: 82.4 fL (ref 80.0–100.0)
Platelets: 250 10*3/uL (ref 150–400)
RBC: 3.75 MIL/uL — ABNORMAL LOW (ref 3.87–5.11)
RDW: 12.9 % (ref 11.5–15.5)
WBC: 6.1 10*3/uL (ref 4.0–10.5)
nRBC: 0 % (ref 0.0–0.2)

## 2023-06-19 MED ORDER — CHLORHEXIDINE GLUCONATE 0.12 % MT SOLN
15.0000 mL | Freq: Once | OROMUCOSAL | Status: AC
  Administered 2023-06-20: 15 mL via OROMUCOSAL

## 2023-06-19 MED ORDER — GABAPENTIN 300 MG PO CAPS
300.0000 mg | ORAL_CAPSULE | ORAL | Status: AC
Start: 2023-06-20 — End: 2023-06-21
  Administered 2023-06-20: 300 mg via ORAL

## 2023-06-19 MED ORDER — CHLORHEXIDINE GLUCONATE CLOTH 2 % EX PADS
6.0000 | MEDICATED_PAD | Freq: Once | CUTANEOUS | Status: AC
  Administered 2023-06-20: 6 via TOPICAL

## 2023-06-19 MED ORDER — LACTATED RINGERS IV SOLN
INTRAVENOUS | Status: DC
Start: 2023-06-19 — End: 2023-06-20

## 2023-06-19 MED ORDER — ACETAMINOPHEN 500 MG PO TABS
1000.0000 mg | ORAL_TABLET | ORAL | Status: AC
Start: 2023-06-20 — End: 2023-06-21
  Administered 2023-06-20: 1000 mg via ORAL

## 2023-06-19 MED ORDER — CEFAZOLIN SODIUM-DEXTROSE 2-4 GM/100ML-% IV SOLN
2.0000 g | INTRAVENOUS | Status: AC
Start: 2023-06-20 — End: 2023-06-21
  Administered 2023-06-20: 2 g via INTRAVENOUS

## 2023-06-19 MED ORDER — ORAL CARE MOUTH RINSE: 15.0000 mL | Freq: Once | OROMUCOSAL | Status: AC

## 2023-06-19 MED ORDER — BUPIVACAINE LIPOSOME 1.3 % IJ SUSP: 20.0000 mL | Freq: Once | INTRAMUSCULAR | Status: DC

## 2023-06-20 ENCOUNTER — Ambulatory Visit
Admission: RE | Admit: 2023-06-20 | Discharge: 2023-06-20 | Disposition: A | Discharge status: Home / Self Care | Payer: Medicaid Other | Attending: Surgery | Admitting: Surgery

## 2023-06-20 ENCOUNTER — Encounter: Payer: Self-pay | Admitting: Surgery

## 2023-06-20 ENCOUNTER — Other Ambulatory Visit: Payer: Self-pay

## 2023-06-20 ENCOUNTER — Ambulatory Visit: Payer: Medicaid Other | Admitting: Urgent Care

## 2023-06-20 ENCOUNTER — Encounter
Admission: RE | Disposition: A | Discharge status: Home / Self Care | Payer: Self-pay | Source: Home / Self Care | Attending: Surgery

## 2023-06-20 ENCOUNTER — Ambulatory Visit: Payer: Medicaid Other | Admitting: Anesthesiology

## 2023-06-20 DIAGNOSIS — Z9221 Personal history of antineoplastic chemotherapy: Secondary | ICD-10-CM | POA: Diagnosis not present

## 2023-06-20 DIAGNOSIS — Z8589 Personal history of malignant neoplasm of other organs and systems: Secondary | ICD-10-CM | POA: Insufficient documentation

## 2023-06-20 DIAGNOSIS — D573 Sickle-cell trait: Secondary | ICD-10-CM | POA: Diagnosis not present

## 2023-06-20 DIAGNOSIS — Z8673 Personal history of transient ischemic attack (TIA), and cerebral infarction without residual deficits: Secondary | ICD-10-CM | POA: Insufficient documentation

## 2023-06-20 DIAGNOSIS — M6208 Separation of muscle (nontraumatic), other site: Secondary | ICD-10-CM | POA: Diagnosis not present

## 2023-06-20 DIAGNOSIS — I10 Essential (primary) hypertension: Secondary | ICD-10-CM | POA: Diagnosis not present

## 2023-06-20 DIAGNOSIS — K4090 Unilateral inguinal hernia, without obstruction or gangrene, not specified as recurrent: Secondary | ICD-10-CM | POA: Diagnosis not present

## 2023-06-20 DIAGNOSIS — Z01812 Encounter for preprocedural laboratory examination: Secondary | ICD-10-CM

## 2023-06-20 HISTORY — PX: ROBOT ASSISTED INGUINAL HERNIA REPAIR: SHX6561

## 2023-06-20 LAB — POCT PREGNANCY, URINE: Preg Test, Ur: NEGATIVE

## 2023-06-20 SURGERY — HERNIORRHAPHY, INGUINAL, ROBOT-ASSISTED, LAPAROSCOPIC
Anesthesia: General | Site: Abdomen | Laterality: Left

## 2023-06-20 MED ORDER — LIDOCAINE HCL (CARDIAC) PF 100 MG/5ML IV SOSY
PREFILLED_SYRINGE | INTRAVENOUS | Status: DC | PRN
  Administered 2023-06-20: 50 mg via INTRAVENOUS

## 2023-06-20 MED ORDER — ONDANSETRON HCL 4 MG/2ML IJ SOLN: 4.0000 mg | Freq: Once | INTRAMUSCULAR | Status: DC | PRN

## 2023-06-20 MED ORDER — KETOROLAC TROMETHAMINE 30 MG/ML IJ SOLN
INTRAMUSCULAR | Status: AC
  Filled 2023-06-20: qty 1

## 2023-06-20 MED ORDER — MIDAZOLAM HCL 2 MG/2ML IJ SOLN
INTRAMUSCULAR | Status: AC
  Filled 2023-06-20: qty 2

## 2023-06-20 MED ORDER — ACETAMINOPHEN 500 MG PO TABS: 1000.0000 mg | ORAL_TABLET | Freq: Four times a day (QID) | ORAL | Status: DC | PRN

## 2023-06-20 MED ORDER — FENTANYL CITRATE (PF) 100 MCG/2ML IJ SOLN
INTRAMUSCULAR | Status: AC
  Filled 2023-06-20: qty 2

## 2023-06-20 MED ORDER — OXYCODONE HCL 5 MG/5ML PO SOLN: 5.0000 mg | Freq: Once | ORAL | Status: AC | PRN

## 2023-06-20 MED ORDER — ACETAMINOPHEN 10 MG/ML IV SOLN: 1000.0000 mg | Freq: Once | INTRAVENOUS | Status: DC | PRN

## 2023-06-20 MED ORDER — BUPIVACAINE LIPOSOME 1.3 % IJ SUSP
INTRAMUSCULAR | Status: AC
Start: 2023-06-20 — End: ?
  Filled 2023-06-20: qty 20

## 2023-06-20 MED ORDER — LACTATED RINGERS IV SOLN: INTRAVENOUS | Status: AC

## 2023-06-20 MED ORDER — 0.9 % SODIUM CHLORIDE (POUR BTL) OPTIME
TOPICAL | Status: DC | PRN
  Administered 2023-06-20: 500 mL

## 2023-06-20 MED ORDER — IBUPROFEN 600 MG PO TABS: 600.0000 mg | ORAL_TABLET | Freq: Three times a day (TID) | ORAL | 1 refills | Status: DC | PRN

## 2023-06-20 MED ORDER — ROCURONIUM BROMIDE 10 MG/ML (PF) SYRINGE
PREFILLED_SYRINGE | INTRAVENOUS | Status: AC
  Filled 2023-06-20: qty 20

## 2023-06-20 MED ORDER — FENTANYL CITRATE (PF) 100 MCG/2ML IJ SOLN
INTRAMUSCULAR | Status: DC | PRN
  Administered 2023-06-20 (×2): 50 ug via INTRAVENOUS

## 2023-06-20 MED ORDER — OXYCODONE HCL 5 MG PO TABS
ORAL_TABLET | ORAL | Status: AC
  Filled 2023-06-20: qty 1

## 2023-06-20 MED ORDER — CEFAZOLIN SODIUM-DEXTROSE 2-4 GM/100ML-% IV SOLN
INTRAVENOUS | Status: AC
  Filled 2023-06-20: qty 100

## 2023-06-20 MED ORDER — CHLORHEXIDINE GLUCONATE 0.12 % MT SOLN
OROMUCOSAL | Status: AC
  Filled 2023-06-20: qty 15

## 2023-06-20 MED ORDER — DEXAMETHASONE SODIUM PHOSPHATE 10 MG/ML IJ SOLN
INTRAMUSCULAR | Status: DC | PRN
  Administered 2023-06-20: 10 mg via INTRAVENOUS

## 2023-06-20 MED ORDER — ONDANSETRON HCL 4 MG/2ML IJ SOLN
INTRAMUSCULAR | Status: AC
  Filled 2023-06-20: qty 2

## 2023-06-20 MED ORDER — OXYCODONE HCL 5 MG PO TABS
5.0000 mg | ORAL_TABLET | Freq: Once | ORAL | Status: AC | PRN
  Administered 2023-06-20: 5 mg via ORAL

## 2023-06-20 MED ORDER — ONDANSETRON HCL 4 MG/2ML IJ SOLN
INTRAMUSCULAR | Status: DC | PRN
  Administered 2023-06-20: 4 mg via INTRAVENOUS

## 2023-06-20 MED ORDER — PROPOFOL 10 MG/ML IV BOLUS
INTRAVENOUS | Status: AC
  Filled 2023-06-20: qty 20

## 2023-06-20 MED ORDER — ACETAMINOPHEN 500 MG PO TABS
ORAL_TABLET | ORAL | Status: AC
  Filled 2023-06-20: qty 2

## 2023-06-20 MED ORDER — MIDAZOLAM HCL 2 MG/2ML IJ SOLN
INTRAMUSCULAR | Status: DC | PRN
  Administered 2023-06-20: 2 mg via INTRAVENOUS

## 2023-06-20 MED ORDER — PROPOFOL 10 MG/ML IV BOLUS
INTRAVENOUS | Status: DC | PRN
  Administered 2023-06-20: 150 mg via INTRAVENOUS

## 2023-06-20 MED ORDER — KETOROLAC TROMETHAMINE 30 MG/ML IJ SOLN
INTRAMUSCULAR | Status: DC | PRN
Start: 2023-06-20 — End: 2023-06-20
  Administered 2023-06-20: 30 mg via INTRAVENOUS

## 2023-06-20 MED ORDER — OXYCODONE HCL 5 MG PO TABS: 5.0000 mg | ORAL_TABLET | ORAL | 0 refills | Status: DC | PRN

## 2023-06-20 MED ORDER — BUPIVACAINE-EPINEPHRINE (PF) 0.25% -1:200000 IJ SOLN
INTRAMUSCULAR | Status: AC
  Filled 2023-06-20: qty 30

## 2023-06-20 MED ORDER — SUGAMMADEX SODIUM 200 MG/2ML IV SOLN
INTRAVENOUS | Status: DC | PRN
  Administered 2023-06-20: 200 mg via INTRAVENOUS

## 2023-06-20 MED ORDER — ROCURONIUM BROMIDE 100 MG/10ML IV SOLN
INTRAVENOUS | Status: DC | PRN
  Administered 2023-06-20: 50 mg via INTRAVENOUS
  Administered 2023-06-20: 20 mg via INTRAVENOUS
  Administered 2023-06-20: 30 mg via INTRAVENOUS

## 2023-06-20 MED ORDER — DEXMEDETOMIDINE HCL IN NACL 80 MCG/20ML IV SOLN
INTRAVENOUS | Status: DC | PRN
Start: 2023-06-20 — End: 2023-06-20
  Administered 2023-06-20 (×2): 8 ug via INTRAVENOUS

## 2023-06-20 MED ORDER — GABAPENTIN 300 MG PO CAPS
ORAL_CAPSULE | ORAL | Status: AC
  Filled 2023-06-20: qty 1

## 2023-06-20 MED ORDER — BUPIVACAINE-EPINEPHRINE (PF) 0.25% -1:200000 IJ SOLN
INTRAMUSCULAR | Status: DC | PRN
  Administered 2023-06-20: 40 mL

## 2023-06-20 MED ORDER — DEXAMETHASONE SODIUM PHOSPHATE 10 MG/ML IJ SOLN
INTRAMUSCULAR | Status: AC
  Filled 2023-06-20: qty 1

## 2023-06-20 MED ORDER — FENTANYL CITRATE (PF) 100 MCG/2ML IJ SOLN
25.0000 ug | INTRAMUSCULAR | Status: DC | PRN
  Administered 2023-06-20: 25 ug via INTRAVENOUS
  Administered 2023-06-20: 50 ug via INTRAVENOUS
  Administered 2023-06-20: 25 ug via INTRAVENOUS
  Administered 2023-06-20: 50 ug via INTRAVENOUS

## 2023-06-20 SURGICAL SUPPLY — 53 items
ADH SKN CLS APL DERMABOND .7 (GAUZE/BANDAGES/DRESSINGS) ×1
COVER TIP SHEARS 8 DVNC (MISCELLANEOUS) ×1 IMPLANT
COVER WAND RF STERILE (DRAPES) ×1 IMPLANT
DERMABOND ADVANCED .7 DNX12 (GAUZE/BANDAGES/DRESSINGS) ×1 IMPLANT
DRAPE ARM DVNC X/XI (DISPOSABLE) ×3 IMPLANT
DRAPE COLUMN DVNC XI (DISPOSABLE) ×1 IMPLANT
ELECT CAUTERY BLADE TIP 2.5 (TIP) ×1
ELECT REM PT RETURN 9FT ADLT (ELECTROSURGICAL) ×1
ELECTRODE CAUTERY BLDE TIP 2.5 (TIP) ×1 IMPLANT
ELECTRODE REM PT RTRN 9FT ADLT (ELECTROSURGICAL) ×1 IMPLANT
FORCEPS BPLR R/ABLATION 8 DVNC (INSTRUMENTS) ×1 IMPLANT
GLOVE SURG SYN 7.0 (GLOVE) ×2 IMPLANT
GLOVE SURG SYN 7.0 PF PI (GLOVE) ×2 IMPLANT
GLOVE SURG SYN 7.5 E (GLOVE) ×2 IMPLANT
GLOVE SURG SYN 7.5 PF PI (GLOVE) ×2 IMPLANT
GOWN STRL REUS W/ TWL LRG LVL3 (GOWN DISPOSABLE) ×4 IMPLANT
GOWN STRL REUS W/TWL LRG LVL3 (GOWN DISPOSABLE) ×4
IRRIGATION STRYKERFLOW (MISCELLANEOUS) ×1 IMPLANT
IRRIGATOR STRYKERFLOW (MISCELLANEOUS)
IV NS 1000ML (IV SOLUTION)
IV NS 1000ML BAXH (IV SOLUTION) IMPLANT
KIT PINK PAD W/HEAD ARE REST (MISCELLANEOUS) ×1
KIT PINK PAD W/HEAD ARM REST (MISCELLANEOUS) ×1 IMPLANT
LABEL OR SOLS (LABEL) ×1 IMPLANT
MANIFOLD NEPTUNE II (INSTRUMENTS) ×1 IMPLANT
MESH 3DMAX MID 4X6 LT LRG (Mesh General) IMPLANT
NDL DRIVE SUT CUT DVNC (INSTRUMENTS) ×1 IMPLANT
NDL HYPO 22X1.5 SAFETY MO (MISCELLANEOUS) ×1 IMPLANT
NDL INSUFFLATION 14GA 120MM (NEEDLE) ×1 IMPLANT
NEEDLE DRIVE SUT CUT DVNC (INSTRUMENTS) ×1 IMPLANT
NEEDLE HYPO 22X1.5 SAFETY MO (MISCELLANEOUS) ×1 IMPLANT
NEEDLE INSUFFLATION 14GA 120MM (NEEDLE) ×1 IMPLANT
OBTURATOR OPTICAL STND 8 DVNC (TROCAR) ×1
OBTURATOR OPTICALSTD 8 DVNC (TROCAR) ×1 IMPLANT
PACK LAP CHOLECYSTECTOMY (MISCELLANEOUS) ×1 IMPLANT
PENCIL SMOKE EVACUATOR (MISCELLANEOUS) ×1 IMPLANT
SCISSORS MNPLR CVD DVNC XI (INSTRUMENTS) ×1 IMPLANT
SEAL UNIV 5-12 XI (MISCELLANEOUS) ×3 IMPLANT
SET TUBE SMOKE EVAC HIGH FLOW (TUBING) ×1 IMPLANT
SOL ELECTROSURG ANTI STICK (MISCELLANEOUS) ×1
SOLUTION ELECTROSURG ANTI STCK (MISCELLANEOUS) ×1 IMPLANT
SPONGE T-LAP 18X18 ~~LOC~~+RFID (SPONGE) ×1 IMPLANT
SUT MNCRL AB 4-0 PS2 18 (SUTURE) ×1 IMPLANT
SUT STRATA 2-0 23CM CT-2 (SUTURE) ×1 IMPLANT
SUT VIC AB 2-0 SH 27 (SUTURE)
SUT VIC AB 2-0 SH 27XBRD (SUTURE) ×2 IMPLANT
SUT VIC AB 3-0 SH 27 (SUTURE)
SUT VIC AB 3-0 SH 27X BRD (SUTURE) ×1 IMPLANT
SUT VICRYL 0 UR6 27IN ABS (SUTURE) ×2 IMPLANT
TAPE TRANSPORE STRL 2 31045 (GAUZE/BANDAGES/DRESSINGS) ×1 IMPLANT
TRAP FLUID SMOKE EVACUATOR (MISCELLANEOUS) ×1 IMPLANT
TRAY FOLEY SLVR 16FR LF STAT (SET/KITS/TRAYS/PACK) ×1 IMPLANT
WATER STERILE IRR 500ML POUR (IV SOLUTION) ×1 IMPLANT

## 2023-06-20 NOTE — Discharge Instructions (Signed)
Discharge Instructions: 1.  Patient may shower, but do not scrub wounds heavily and dab dry only. 2.  Do not submerge wounds in pool/tub until fully healed. 3.  Do not apply ointments or hydrogen peroxide to the wounds. 4.  May apply ice packs to the wounds for comfort. 5.  It is common for there to be swelling or bruising in the groin area after surgery.  This will resolve on its own. 6.  Do not drive while taking narcotics for pain control.  Prior to driving, make sure you are able to rotate right and left to look at blindspots without significant pain or discomfort. 7.  No heavy lifting or pushing of more than 10-15 lbs for 4 weeks.

## 2023-06-20 NOTE — Op Note (Signed)
Procedure Date:  06/20/2023  Pre-operative Diagnosis:  Left inguinal hernia  Post-operative Diagnosis: Left inguinal hernia  Procedure: 1.  Robotic assisted Left Inguinal Hernia Repair 2.  Creation of Left Posterior Rectus-Transversalis Fascia Advancment Flap for Coverage of Pelvic Wound (200 cm)  Surgeon:  Howie Ill, MD  Anesthesia:  General endotracheal  Estimated Blood Loss:  10 ml  Specimens:  None  Complications:  None  Indications for Procedure:  This is a 37 y.o. female who presents with a left inguinal hernia.  The options of surgery versus observation were reviewed with the patient and/or family. The risks of bleeding, abscess or infection, recurrence of symptoms, potential for an open procedure, injury to surrounding structures, and chronic pain were all discussed with the patient and she was willing to proceed.  We have planned this transabdominal procedure with the creation of a left peritoneal flap based on the posterior rectus sheath and transversalis fascia in order to fully cover the mesh, creating a natural tisssue barrier for the bowel and peritoneal cavity.  Description of Procedure: The patient was correctly identified in the preoperative area and brought into the operating room.  The patient was placed supine with VTE prophylaxis in place.  Appropriate time-outs were performed.  Anesthesia was induced and the patient was intubated.  Foley catheter was placed.  Appropriate antibiotics were infused.  The abdomen was prepped and draped in a sterile fashion. A supraumbilical incision was made. A cutdown technique was used to enter the abdominal cavity without injury, and a Hasson trocar was inserted.  Pneumoperitoneum was obtained with appropriate opening pressures.  A Veress needle was used to start dissecting the peritoneal flap.  Two 8-mm robotic ports were placed in the right and left lateral positions under direct visualization.  A large left Bard 3D Max Mid  Mesh, a 2-0 Vicryl, and 2-0 vlock suture were placed through the umbilical port under direct visualization.  The [employer] platform was docked onto the patient, the camera was inserted and targeted, and the instruments were placed under direct visualization.  Both inguinal regions were inspected for hernias and it was confirmed that the patient had a left inguinal hernia.  Using electocautery, the peritoneal and posterior rectus tissue flap was created.  The peritoneum on the left side was scored from the median umbilical ligament laterally towards the ASIS.  The flap was mobilized using robotic scissors and the bipolar instruments, creating a plane along the posterior rectus sheath and transversalis fascia down to the pubic tubercle medially. It was then further mobilized laterally across the inguinal canal and femoral vessels and onto the psoas muscle. The inferior epigastric vessels were identified and preserved. This created a posterior rectus and peritoneal flap measuring roughly 17 cm x 12 cm.  The hernia contained a cord lipoma which was resected.  The round ligament was preserved.  A large left Bard 3D Max Mid mesh was placed with good overlap along all the potential hernia defects and secured in place with 2-0 Vicryl along the medial superomedial and superolateral aspects.  Then, the peritoneal flap was advanced over the mesh and carried over to close the defect. A running 2-0 V lock suture was used to approximate the edge of the flap onto the peritoneum.  All needles were removed under direct visualization.  The 8- mm ports were removed under direct visualization and the Hasson trocar was removed.  The fascial opening was closed using 0 vicryl suture.  Local anesthetic was infused in all  incisions as well as a left ilioinguinal block, and the incisions were closed with 4-0 Monocryl.  The wounds were cleaned and sealed with DermaBond.  Foley catheter was removed and the patient was emerged from  anesthesia and extubated and brought to the recovery room for further management.  The patient tolerated the procedure well and all counts were correct at the end of the case.   Howie Ill, MD

## 2023-06-20 NOTE — Anesthesia Postprocedure Evaluation (Signed)
Anesthesia Post Note  Patient: Janice Stewart  Procedure(s) Performed: XI ROBOTIC ASSISTED INGUINAL HERNIA (Left: Abdomen)  Patient location during evaluation: PACU Anesthesia Type: General Level of consciousness: awake and alert Pain management: pain level controlled Vital Signs Assessment: post-procedure vital signs reviewed and stable Respiratory status: spontaneous breathing, nonlabored ventilation, respiratory function stable and patient connected to nasal cannula oxygen Cardiovascular status: blood pressure returned to baseline and stable Postop Assessment: no apparent nausea or vomiting Anesthetic complications: no   No notable events documented.   Last Vitals:  Vitals:   06/20/23 1935 06/20/23 2000  BP: (!) 139/98 (!) 135/92  Pulse: 88 82  Resp:    Temp:  36.4 C  SpO2: 100% 100%    Last Pain:  Vitals:   06/20/23 2000  TempSrc:   PainSc: 0-No pain                 Lenard Simmer

## 2023-06-20 NOTE — Anesthesia Procedure Notes (Signed)
Procedure Name: Intubation Date/Time: 06/20/2023 4:05 PM  Performed by: Hezzie Bump, CRNAPre-anesthesia Checklist: Patient identified, Patient being monitored, Timeout performed, Emergency Drugs available and Suction available Patient Re-evaluated:Patient Re-evaluated prior to induction Oxygen Delivery Method: Circle system utilized Preoxygenation: Pre-oxygenation with 100% oxygen Induction Type: IV induction Ventilation: Mask ventilation without difficulty Laryngoscope Size: 3 and McGraph Grade View: Grade I Tube type: Oral Tube size: 7.0 mm Number of attempts: 1 Airway Equipment and Method: Stylet and Video-laryngoscopy Placement Confirmation: ETT inserted through vocal cords under direct vision, positive ETCO2 and breath sounds checked- equal and bilateral Secured at: 21 cm Tube secured with: Tape Dental Injury: Teeth and Oropharynx as per pre-operative assessment

## 2023-06-20 NOTE — Anesthesia Preprocedure Evaluation (Addendum)
Anesthesia Evaluation  Patient identified by MRN, date of birth, ID band Patient awake    Reviewed: Allergy & Precautions, NPO status , Patient's Chart, lab work & pertinent test results  History of Anesthesia Complications Negative for: history of anesthetic complications  Airway Mallampati: II   Neck ROM: Full    Dental no notable dental hx.    Pulmonary    Pulmonary exam normal breath sounds clear to auscultation       Cardiovascular hypertension, Normal cardiovascular exam Rhythm:Regular Rate:Normal  ECG 06/19/23: normal   Neuro/Psych  Headaches CVA (2017), No Residual Symptoms    GI/Hepatic negative GI ROS,,,  Endo/Other  negative endocrine ROS    Renal/GU negative Renal ROS     Musculoskeletal   Abdominal   Peds  Hematology  (+) Blood dyscrasia, Sickle cell trait and anemia   Anesthesia Other Findings   Reproductive/Obstetrics Hx gestational choriocarcinoma                             Anesthesia Physical Anesthesia Plan  ASA: 2  Anesthesia Plan: General   Post-op Pain Management:    Induction: Intravenous  PONV Risk Score and Plan: 3 and Ondansetron, Dexamethasone and Treatment may vary due to age or medical condition  Airway Management Planned: Oral ETT  Additional Equipment:   Intra-op Plan:   Post-operative Plan: Extubation in OR  Informed Consent: I have reviewed the patients History and Physical, chart, labs and discussed the procedure including the risks, benefits and alternatives for the proposed anesthesia with the patient or authorized representative who has indicated his/her understanding and acceptance.     Dental advisory given  Plan Discussed with: CRNA  Anesthesia Plan Comments: (Patient consented for risks of anesthesia including but not limited to:  - adverse reactions to medications - damage to eyes, teeth, lips or other oral mucosa - nerve  damage due to positioning  - sore throat or hoarseness - damage to heart, brain, nerves, lungs, other parts of body or loss of life  Informed patient about role of CRNA in peri- and intra-operative care.  Patient voiced understanding.)        Anesthesia Quick Evaluation

## 2023-06-20 NOTE — Transfer of Care (Signed)
Immediate Anesthesia Transfer of Care Note  Patient: Janice Stewart  Procedure(s) Performed: XI ROBOTIC ASSISTED INGUINAL HERNIA (Left: Abdomen)  Patient Location: PACU  Anesthesia Type:General  Level of Consciousness: drowsy  Airway & Oxygen Therapy: Patient Spontanous Breathing and Patient connected to nasal cannula oxygen  Post-op Assessment: Report given to RN and Post -op Vital signs reviewed and stable  Post vital signs: Reviewed and stable  Last Vitals:  Vitals Value Taken Time  BP 123/77 06/20/23 1818  Temp    Pulse 101 06/20/23 1820  Resp    SpO2 100 % 06/20/23 1820  Vitals shown include unfiled device data.  Last Pain:  Vitals:   06/20/23 1225  TempSrc: Oral  PainSc: 0-No pain         Complications: No notable events documented.

## 2023-06-20 NOTE — Interval H&P Note (Signed)
History and Physical Interval Note:  06/20/2023 3:40 PM  Janice Stewart  has presented today for surgery, with the diagnosis of left inguinal hernia.  The various methods of treatment have been discussed with the patient and family. After consideration of risks, benefits and other options for treatment, the patient has consented to  Procedure(s): XI ROBOTIC ASSISTED INGUINAL HERNIA, possible bilateral (Left) as a surgical intervention.  The patient's history has been reviewed, patient examined, no change in status, stable for surgery.  I have reviewed the patient's chart and labs.  Questions were answered to the patient's satisfaction.     [name]

## 2023-07-05 ENCOUNTER — Ambulatory Visit (INDEPENDENT_AMBULATORY_CARE_PROVIDER_SITE_OTHER): Payer: Medicaid Other | Admitting: Surgery

## 2023-07-05 ENCOUNTER — Encounter: Payer: Self-pay | Admitting: Surgery

## 2023-07-05 VITALS — BP 138/80 | HR 76 | Temp 98.5°F | Ht 69.0 in | Wt 192.2 lb

## 2023-07-05 DIAGNOSIS — Z09 Encounter for follow-up examination after completed treatment for conditions other than malignant neoplasm: Secondary | ICD-10-CM

## 2023-07-05 DIAGNOSIS — K429 Umbilical hernia without obstruction or gangrene: Secondary | ICD-10-CM

## 2023-07-05 DIAGNOSIS — K4090 Unilateral inguinal hernia, without obstruction or gangrene, not specified as recurrent: Secondary | ICD-10-CM

## 2023-07-05 DIAGNOSIS — M6208 Separation of muscle (nontraumatic), other site: Secondary | ICD-10-CM

## 2023-07-05 NOTE — H&P (View-Only) (Signed)
 07/05/2023  History of Present Illness: Janice Stewart is a 37 y.o. female status post robotic assisted left inguinal hernia repair on 06/20/2023.  This was done as first part of 2 planned surgeries for a left inguinal hernia, followed by repair of umbilical hernia and plication of diastases recti.  The patient presents today for postoperative follow-up.  She reports that she has been doing well from the left inguinal hernia standpoint and reports some intermittent discomfort in the left groin and some sensitivity changes but otherwise denies any new bulging, or worsening pain.  She is otherwise ready to proceed with the next stage in her surgeries to repair the umbilical hernia and plicate the diastases recti.  Past Medical History: Past Medical History:  Diagnosis Date   Anemia    Cancer (HCC)    Headache    Hypertension    Sickle cell trait (HCC)    Stroke [facility]      Past Surgical History: Past Surgical History:  Procedure Laterality Date   LIVER BIOPSY  2017   LUNG BIOPSY  2017    Home Medications: Prior to Admission medications   Medication Sig Start Date End Date Taking? Authorizing Provider  acetaminophen (TYLENOL) 500 MG tablet Take 2 tablets (1,000 mg total) by mouth every 6 (six) hours as needed for mild pain (pain score 1-3). 06/20/23  Yes Christopherjohn Schiele, Elita Quick, MD  BIOTIN PO Take 1 tablet by mouth daily.   Yes [provider]  Cholecalciferol (VITAMIN D3) 50 MCG (2000 UT) CHEW Chew 2,000 Units by mouth in the morning.   Yes [provider]  folic acid (FOLVITE) 1 MG tablet Take 1 tablet (1 mg total) by mouth daily. 07/10/22 07/10/23 Yes Massie Maroon, FNP  ibuprofen (ADVIL) 600 MG tablet Take 1 tablet (600 mg total) by mouth every 8 (eight) hours as needed for moderate pain (pain score 4-6). 06/20/23  Yes Sowmya Partridge, Elita Quick, MD  minoxidil (LONITEN) 2.5 MG tablet Take 1 tablet (2.5 mg total) by mouth daily. 01/24/23  Yes Deirdre Evener, MD  Multiple  Vitamin (MULTIVITAMIN WITH MINERALS) TABS tablet Take 1 tablet by mouth in the morning.   Yes [provider]  valsartan (DIOVAN) 80 MG tablet Take 1 tablet by mouth once daily 06/17/23  Yes Zonia Kief, Amy J, NP  valsartan (DIOVAN) 80 MG tablet Take 1 tablet (80 mg total) by mouth daily. 06/17/23  Yes Rema Fendt, NP    Allergies: No Known Allergies  Review of Systems: Review of Systems  Constitutional:  Negative for chills and fever.  Respiratory:  Negative for shortness of breath.   Cardiovascular:  Negative for chest pain.  Gastrointestinal:  Positive for abdominal pain (postoperative soreness). Negative for nausea and vomiting.  Genitourinary:  Negative for dysuria.    Physical Exam BP 138/80   Pulse 76   Temp 98.5 F (36.9 C) (Oral)   Ht 5\' 9"  (1.753 m)   Wt 192 lb 3.2 oz (87.2 kg)   LMP 06/14/2023   SpO2 95%   BMI 28.38 kg/m  CONSTITUTIONAL: No acute distress, well-nourished HEENT:  Normocephalic, atraumatic, extraocular motion intact. RESPIRATORY:  Normal respiratory effort without pathologic use of accessory muscles. CARDIOVASCULAR: Regular rhythm and rate. GI: The abdomen is soft, nondistended, with appropriate soreness to palpation.  Robotic incisions are healing well and are clean, dry, intact.  No evidence of hernia recurrence in the left groin.  There are some palpable firmness at the location of her hernia itself likely  corresponding to scar tissue but otherwise no palpable seroma or hematoma.  Her umbilical hernia and diastases recti are stable. NEUROLOGIC:  Motor and sensation is grossly normal.  Cranial nerves are grossly intact. PSYCH:  Alert and oriented to person, place and time. Affect is normal.  Labs/Imaging: None new  Assessment and Plan: This is a 37 y.o. female status post robotic assisted left inguinal hernia repair.  - The patient is recovering well from her inguinal hernia surgery.  Discussed with her that the discomfort sensitivity is  not uncommon due to postoperative changes and inflammation that can be irritating the nerves in the groin.  This should be improving with time.  The firmness that is palpable is also as a result of her scar tissue forming that should also improve with time. - Otherwise the patient is ready to continue planning for the second stage in her surgery to repair her umbilical hernia and plicating the diastases recti.  Discussed with her that the plan will remain the same with a robotic approach.  Reviewed with her again the surgery at length including the planned incisions, risks of bleeding, infection, injury to surrounding structures, postoperative activity restrictions, potential for overnight hospital stay, pain control, and she is willing to proceed. - Patient will be scheduled for surgery on 07/25/2023.  All of her questions have been answered.   Howie Ill, MD [facility]

## 2023-07-05 NOTE — Progress Notes (Signed)
07/05/2023  History of Present Illness: Janice Stewart is a 37 y.o. female status post robotic assisted left inguinal hernia repair on 06/20/2023.  This was done as first part of 2 planned surgeries for a left inguinal hernia, followed by repair of umbilical hernia and plication of diastases recti.  The patient presents today for postoperative follow-up.  She reports that she has been doing well from the left inguinal hernia standpoint and reports some intermittent discomfort in the left groin and some sensitivity changes but otherwise denies any new bulging, or worsening pain.  She is otherwise ready to proceed with the next stage in her surgeries to repair the umbilical hernia and plicate the diastases recti.  Past Medical History: Past Medical History:  Diagnosis Date   Anemia    Cancer (HCC)    Headache    Hypertension    Sickle cell trait (HCC)    Stroke [facility]      Past Surgical History: Past Surgical History:  Procedure Laterality Date   LIVER BIOPSY  2017   LUNG BIOPSY  2017    Home Medications: Prior to Admission medications   Medication Sig Start Date End Date Taking? Authorizing Provider  acetaminophen (TYLENOL) 500 MG tablet Take 2 tablets (1,000 mg total) by mouth every 6 (six) hours as needed for mild pain (pain score 1-3). 06/20/23  Yes Christopherjohn Schiele, Elita Quick, MD  BIOTIN PO Take 1 tablet by mouth daily.   Yes [provider]  Cholecalciferol (VITAMIN D3) 50 MCG (2000 UT) CHEW Chew 2,000 Units by mouth in the morning.   Yes [provider]  folic acid (FOLVITE) 1 MG tablet Take 1 tablet (1 mg total) by mouth daily. 07/10/22 07/10/23 Yes Massie Maroon, FNP  ibuprofen (ADVIL) 600 MG tablet Take 1 tablet (600 mg total) by mouth every 8 (eight) hours as needed for moderate pain (pain score 4-6). 06/20/23  Yes Sowmya Partridge, Elita Quick, MD  minoxidil (LONITEN) 2.5 MG tablet Take 1 tablet (2.5 mg total) by mouth daily. 01/24/23  Yes Deirdre Evener, MD  Multiple  Vitamin (MULTIVITAMIN WITH MINERALS) TABS tablet Take 1 tablet by mouth in the morning.   Yes [provider]  valsartan (DIOVAN) 80 MG tablet Take 1 tablet by mouth once daily 06/17/23  Yes Zonia Kief, Amy J, NP  valsartan (DIOVAN) 80 MG tablet Take 1 tablet (80 mg total) by mouth daily. 06/17/23  Yes Rema Fendt, NP    Allergies: No Known Allergies  Review of Systems: Review of Systems  Constitutional:  Negative for chills and fever.  Respiratory:  Negative for shortness of breath.   Cardiovascular:  Negative for chest pain.  Gastrointestinal:  Positive for abdominal pain (postoperative soreness). Negative for nausea and vomiting.  Genitourinary:  Negative for dysuria.    Physical Exam BP 138/80   Pulse 76   Temp 98.5 F (36.9 C) (Oral)   Ht 5\' 9"  (1.753 m)   Wt 192 lb 3.2 oz (87.2 kg)   LMP 06/14/2023   SpO2 95%   BMI 28.38 kg/m  CONSTITUTIONAL: No acute distress, well-nourished HEENT:  Normocephalic, atraumatic, extraocular motion intact. RESPIRATORY:  Normal respiratory effort without pathologic use of accessory muscles. CARDIOVASCULAR: Regular rhythm and rate. GI: The abdomen is soft, nondistended, with appropriate soreness to palpation.  Robotic incisions are healing well and are clean, dry, intact.  No evidence of hernia recurrence in the left groin.  There are some palpable firmness at the location of her hernia itself likely  corresponding to scar tissue but otherwise no palpable seroma or hematoma.  Her umbilical hernia and diastases recti are stable. NEUROLOGIC:  Motor and sensation is grossly normal.  Cranial nerves are grossly intact. PSYCH:  Alert and oriented to person, place and time. Affect is normal.  Labs/Imaging: None new  Assessment and Plan: This is a 37 y.o. female status post robotic assisted left inguinal hernia repair.  - The patient is recovering well from her inguinal hernia surgery.  Discussed with her that the discomfort sensitivity is  not uncommon due to postoperative changes and inflammation that can be irritating the nerves in the groin.  This should be improving with time.  The firmness that is palpable is also as a result of her scar tissue forming that should also improve with time. - Otherwise the patient is ready to continue planning for the second stage in her surgery to repair her umbilical hernia and plicating the diastases recti.  Discussed with her that the plan will remain the same with a robotic approach.  Reviewed with her again the surgery at length including the planned incisions, risks of bleeding, infection, injury to surrounding structures, postoperative activity restrictions, potential for overnight hospital stay, pain control, and she is willing to proceed. - Patient will be scheduled for surgery on 07/25/2023.  All of her questions have been answered.   Howie Ill, MD [facility]

## 2023-07-05 NOTE — Patient Instructions (Addendum)
You have requested for your Umbilical Hernia be repaired. This will be scheduled with Dr. Aleen Campi at [facility].   Please see your (blue)pre-care sheet for information. Our surgery scheduler will call you to verify surgery date and to go over information.   You will need to arrange to be off work for 1-2 weeks but will have to have a lifting restriction of no more than 15 lbs for 6 weeks following your surgery. If you have FMLA or disability paperwork that needs filled out you may drop this off at our office or this can be faxed to [phone].     Umbilical Hernia, Adult A hernia is a bulge of tissue that pushes through an opening between muscles. An umbilical hernia happens in the abdomen, near the belly button (umbilicus). The hernia may contain tissues from the small intestine, large intestine, or fatty tissue covering the intestines (omentum). Umbilical hernias in adults tend to get worse over time, and they require surgical treatment. There are several types of umbilical hernias. You may have: A hernia located just above or below the umbilicus (indirect hernia). This is the most common type of umbilical hernia in adults. A hernia that forms through an opening formed by the umbilicus (direct hernia). A hernia that comes and goes (reducible hernia). A reducible hernia may be visible only when you strain, lift something heavy, or cough. This type of hernia can be pushed back into the abdomen (reduced). A hernia that traps abdominal tissue inside the hernia (incarcerated hernia). This type of hernia cannot be reduced. A hernia that cuts off blood flow to the tissues inside the hernia (strangulated hernia). The tissues can start to die if this happens. This type of hernia requires emergency treatment.  What are the causes? An umbilical hernia happens when tissue inside the abdomen presses on a weak area of the abdominal muscles. What increases the risk? You may have a  greater risk of this condition if you: Are obese. Have had several pregnancies. Have a buildup of fluid inside your abdomen (ascites). Have had surgery that weakens the abdominal muscles.  What are the signs or symptoms? The main symptom of this condition is a painless bulge at or near the belly button. A reducible hernia may be visible only when you strain, lift something heavy, or cough. Other symptoms may include: Dull pain. A feeling of pressure.  Symptoms of a strangulated hernia may include: Pain that gets increasingly worse. Nausea and vomiting. Pain when pressing on the hernia. Skin over the hernia becoming red or purple. Constipation. Blood in the stool.  How is this diagnosed? This condition may be diagnosed based on: A physical exam. You may be asked to cough or strain while standing. These actions increase the pressure inside your abdomen and force the hernia through the opening in your muscles. Your health care provider may try to reduce the hernia by pressing on it. Your symptoms and medical history.  How is this treated? Surgery is the only treatment for an umbilical hernia. Surgery for a strangulated hernia is done as soon as possible. If you have a small hernia that is not incarcerated, you may need to lose weight before having surgery. Follow these instructions at home: Lose weight, if told by your health care provider. Do not try to push the hernia back in. Watch your hernia for any changes in color or size. Tell your health care provider if any changes occur. You may need to avoid activities  that increase pressure on your hernia. Do not lift anything that is heavier than 10 lb (4.5 kg) until your health care provider says that this is safe. Take over-the-counter and prescription medicines only as told by your health care provider. Keep all follow-up visits as told by your health care provider. This is important. Contact a health care provider if: Your hernia  gets larger. Your hernia becomes painful. Get help right away if: You develop sudden, severe pain near the area of your hernia. You have pain as well as nausea or vomiting. You have pain and the skin over your hernia changes color. You develop a fever. This information is not intended to replace advice given to you by your health care provider. Make sure you discuss any questions you have with your health care provider. Document Released: 12/30/2015 Document Revised: 04/01/2016 Document Reviewed: 12/30/2015 Elsevier Interactive Patient Education  2018 [employer]     GENERAL POST-OPERATIVE PATIENT INSTRUCTIONS   WOUND CARE INSTRUCTIONS:  Keep a dry clean dressing on the wound if there is drainage. The initial bandage may be removed after 24 hours.  Once the wound has quit draining you may leave it open to air.  If clothing rubs against the wound or causes irritation and the wound is not draining you may cover it with a dry dressing during the daytime.  Try to keep the wound dry and avoid ointments on the wound unless directed to do so.  If the wound becomes bright red and painful or starts to drain infected material that is not clear, please contact your physician immediately.  If the wound is mildly pink and has a thick firm ridge underneath it, this is normal, and is referred to as a healing ridge.  This will resolve over the next 4-6 weeks.  BATHING: You may shower if you have been informed of this by your surgeon. However, Please do not submerge in a tub, hot tub, or pool until incisions are completely sealed or have been told by your surgeon that you may do so.  DIET:  You may eat any foods that you can tolerate.  It is a good idea to eat a high fiber diet and take in plenty of fluids to prevent constipation.  If you do become constipated you may want to take a mild laxative or take ducolax tablets on a daily basis until your bowel habits are regular.  Constipation can be very  uncomfortable, along with straining, after recent surgery.  ACTIVITY:  You are encouraged to cough and deep breath or use your incentive spirometer if you were given one, every 15-30 minutes when awake.  This will help prevent respiratory complications and low grade fevers post-operatively if you had a general anesthetic.  You may want to hug a pillow when coughing and sneezing to add additional support to the surgical area, if you had abdominal or chest surgery, which will decrease pain during these times.  You are encouraged to walk and engage in light activity for the next two weeks.  You should not lift more than 20 pounds for 6 weeks total after surgery as it could put you at increased risk for complications.  Twenty pounds is roughly equivalent to a plastic bag of groceries. At that time- Listen to your body when lifting, if you have pain when lifting, stop and then try again in a few days. Soreness after doing exercises or activities of daily living is normal as you get back in to your  normal routine.  MEDICATIONS:  Try to take narcotic medications and anti-inflammatory medications, such as tylenol, ibuprofen, naprosyn, etc., with food.  This will minimize stomach upset from the medication.  Should you develop nausea and vomiting from the pain medication, or develop a rash, please discontinue the medication and contact your physician.  You should not drive, make important decisions, or operate machinery when taking narcotic pain medication.  SUNBLOCK Use sun block to incision area over the next year if this area will be exposed to sun. This helps decrease scarring and will allow you avoid a permanent darkened area over your incision.  QUESTIONS:  Please feel free to call our office if you have any questions, and we will be glad to assist you. [phone]

## 2023-07-08 ENCOUNTER — Telehealth: Payer: Self-pay | Admitting: Surgery

## 2023-07-08 NOTE — Telephone Encounter (Signed)
Patient has been advised of Pre-Admission date/time, and Surgery date at [facility].  Surgery Date: 07/25/23 Preadmission Testing Date: 07/17/23 (phone 1p-4p)  Patient has been made aware to call [phone], between 1-3:00pm the day before surgery, to find out what time to arrive for surgery.

## 2023-07-17 ENCOUNTER — Other Ambulatory Visit: Payer: Self-pay

## 2023-07-17 ENCOUNTER — Encounter
Admission: RE | Admit: 2023-07-17 | Discharge: 2023-07-17 | Disposition: A | Discharge status: Home / Self Care | Payer: Medicaid Other | Source: Ambulatory Visit | Attending: Surgery | Admitting: Surgery

## 2023-07-17 VITALS — Ht 69.0 in | Wt 193.0 lb

## 2023-07-17 DIAGNOSIS — I10 Essential (primary) hypertension: Secondary | ICD-10-CM

## 2023-07-17 DIAGNOSIS — Z01812 Encounter for preprocedural laboratory examination: Secondary | ICD-10-CM

## 2023-07-17 HISTORY — DX: Osteonecrosis, unspecified: M87.9

## 2023-07-17 HISTORY — DX: Essential (primary) hypertension: I10

## 2023-07-17 NOTE — Patient Instructions (Addendum)
Your procedure is scheduled on: Thursday, December 12 Report to the Registration Desk on the 1st floor of the [facility] To find out your arrival time, please call [phone] between 1PM - 3PM on: Wednesday, December 11 If your arrival time is 6:00 am, do not arrive before that time as the [facility] entrance doors do not open until 6:00 am.  REMEMBER: Instructions that are not followed completely may result in serious medical risk, up to and including death; or upon the discretion of your surgeon and anesthesiologist your surgery may need to be rescheduled.  Do not eat food after midnight the night before surgery.  No gum chewing or hard candies.  You may however, drink CLEAR liquids up to 2 hours before you are scheduled to arrive for your surgery. Do not drink anything within 2 hours of your scheduled arrival time.  Clear liquids include: - water  - apple juice without pulp - gatorade (not RED colors) - black coffee or tea (Do NOT add milk or creamers to the coffee or tea) Do NOT drink anything that is not on this list.  One week prior to surgery: starting December 5 Stop Anti-inflammatories (NSAIDS) such as Advil, Aleve, Ibuprofen, Motrin, Naproxen, Naprosyn and Aspirin based products such as Excedrin, Goody's Powder, BC Powder. Stop ANY OVER THE COUNTER supplements until after surgery. Stop biotin, vitamin D, folic acid, multiple vitamins.  You may however, continue to take Tylenol if needed for pain up until the day of surgery.  Continue taking all of your other prescription medications up until the day of surgery.  ON THE DAY OF SURGERY ONLY TAKE THESE MEDICATIONS WITH SIPS OF WATER:  Monoxidil  No Alcohol for 24 hours before or after surgery.  No Smoking including e-cigarettes for 24 hours before surgery.  No chewable tobacco products for at least 6 hours before surgery.  No nicotine patches on the day of surgery.  Do not use any "recreational" drugs for at  least a week (preferably 2 weeks) before your surgery.  Please be advised that the combination of cocaine and anesthesia may have negative outcomes, up to and including death. If you test positive for cocaine, your surgery will be cancelled.  On the morning of surgery brush your teeth with toothpaste and water, you may rinse your mouth with mouthwash if you wish. Do not swallow any toothpaste or mouthwash.  Use CHG Soap as directed on instruction sheet.  Do not wear jewelry, make-up, hairpins, clips or nail polish.  For welded (permanent) jewelry: bracelets, anklets, waist bands, etc.  Please have this removed prior to surgery.  If it is not removed, there is a chance that hospital personnel will need to cut it off on the day of surgery.  Do not wear lotions, powders, or perfumes.   Do not shave body hair from the neck down 48 hours before surgery.  Contact lenses, hearing aids and dentures may not be worn into surgery.  Do not bring valuables to the hospital. [facility] is not responsible for any missing/lost belongings or valuables.   Notify your doctor if there is any change in your medical condition (cold, fever, infection).  Wear comfortable clothing (specific to your surgery type) to the hospital.  After surgery, you can help prevent lung complications by doing breathing exercises.  Take deep breaths and cough every 1-2 hours. Your doctor may order a device called an Incentive Spirometer to help you take deep breaths. When coughing or sneezing, hold a  pillow firmly against your incision with both hands. This is called "splinting." Doing this helps protect your incision. It also decreases belly discomfort.  If you are being discharged the day of surgery, you will not be allowed to drive home. You will need a responsible individual to drive you home and stay with you for 24 hours after surgery.   If you are taking public transportation, you will need to have a responsible  individual with you.  Please call the Pre-admissions Testing Dept. at [phone] if you have any questions about these instructions.  Surgery Visitation Policy:  Patients having surgery or a procedure may have two visitors.  Children under the age of 72 must have an adult with them who is not the patient.     Preparing for Surgery with CHLORHEXIDINE GLUCONATE (CHG) Soap  Chlorhexidine Gluconate (CHG) Soap  o An antiseptic cleaner that kills germs and bonds with the skin to continue killing germs even after washing  o Used for showering the night before surgery and morning of surgery  Before surgery, you can play an important role by reducing the number of germs on your skin.  CHG (Chlorhexidine gluconate) soap is an antiseptic cleanser which kills germs and bonds with the skin to continue killing germs even after washing.  Please do not use if you have an allergy to CHG or antibacterial soaps. If your skin becomes reddened/irritated stop using the CHG.  1. Shower the NIGHT BEFORE SURGERY and the MORNING OF SURGERY with CHG soap.  2. If you choose to wash your hair, wash your hair first as usual with your normal shampoo.  3. After shampooing, rinse your hair and body thoroughly to remove the shampoo.  4. Use CHG as you would any other liquid soap. You can apply CHG directly to the skin and wash gently with a scrungie or a clean washcloth.  5. Apply the CHG soap to your body only from the neck down. Do not use on open wounds or open sores. Avoid contact with your eyes, ears, mouth, and genitals (private parts). Wash face and genitals (private parts) with your normal soap.  6. Wash thoroughly, paying special attention to the area where your surgery will be performed.  7. Thoroughly rinse your body with warm water.  8. Do not shower/wash with your normal soap after using and rinsing off the CHG soap.  9. Pat yourself dry with a clean towel.  10. Wear clean pajamas to bed  the night before surgery.  12. Place clean sheets on your bed the night of your first shower and do not sleep with pets.  13. Shower again with the CHG soap on the day of surgery prior to arriving at the hospital.  14. Do not apply any deodorants/lotions/powders.  15. Please wear clean clothes to the hospital.

## 2023-07-19 ENCOUNTER — Encounter
Admission: RE | Admit: 2023-07-19 | Discharge: 2023-07-19 | Disposition: A | Discharge status: Home / Self Care | Payer: Medicaid Other | Source: Ambulatory Visit | Attending: Surgery | Admitting: Surgery

## 2023-07-19 ENCOUNTER — Encounter: Payer: Self-pay | Admitting: Urgent Care

## 2023-07-19 DIAGNOSIS — I10 Essential (primary) hypertension: Secondary | ICD-10-CM | POA: Diagnosis not present

## 2023-07-19 DIAGNOSIS — Z01812 Encounter for preprocedural laboratory examination: Secondary | ICD-10-CM | POA: Diagnosis not present

## 2023-07-19 LAB — BASIC METABOLIC PANEL
Anion gap: 8 (ref 5–15)
BUN: 15 mg/dL (ref 6–20)
CO2: 25 mmol/L (ref 22–32)
Calcium: 9 mg/dL (ref 8.9–10.3)
Chloride: 105 mmol/L (ref 98–111)
Creatinine, Ser: 0.91 mg/dL (ref 0.44–1.00)
GFR, Estimated: >60 mL/min (ref >60)
Glucose, Bld: 80 mg/dL (ref 70–99)
Potassium: 3.7 mmol/L (ref 3.5–5.1)
Sodium: 138 mmol/L (ref 135–145)

## 2023-07-22 ENCOUNTER — Telehealth: Payer: Self-pay | Admitting: *Deleted

## 2023-07-22 NOTE — Telephone Encounter (Signed)
Faxed husbands FMLA (Edre Morales-Feliz) to [facility] at [phone]

## 2023-07-25 ENCOUNTER — Encounter: Payer: Self-pay | Admitting: Surgery

## 2023-07-25 ENCOUNTER — Other Ambulatory Visit: Payer: Self-pay

## 2023-07-25 ENCOUNTER — Ambulatory Visit: Payer: Medicaid Other | Admitting: Anesthesiology

## 2023-07-25 ENCOUNTER — Encounter
Admission: RE | Disposition: A | Discharge status: Home / Self Care | Payer: Self-pay | Source: Home / Self Care | Attending: Surgery

## 2023-07-25 ENCOUNTER — Ambulatory Visit: Payer: Medicaid Other | Admitting: Dermatology

## 2023-07-25 ENCOUNTER — Ambulatory Visit
Admission: RE | Admit: 2023-07-25 | Discharge: 2023-07-25 | Disposition: A | Discharge status: Home / Self Care | Payer: Medicaid Other | Attending: Surgery | Admitting: Surgery

## 2023-07-25 VITALS — BP 162/104 | HR 69 | Wt 193.0 lb

## 2023-07-25 DIAGNOSIS — I10 Essential (primary) hypertension: Secondary | ICD-10-CM | POA: Insufficient documentation

## 2023-07-25 DIAGNOSIS — Z79899 Other long term (current) drug therapy: Secondary | ICD-10-CM | POA: Diagnosis not present

## 2023-07-25 DIAGNOSIS — K429 Umbilical hernia without obstruction or gangrene: Secondary | ICD-10-CM | POA: Insufficient documentation

## 2023-07-25 DIAGNOSIS — Z7189 Other specified counseling: Secondary | ICD-10-CM | POA: Diagnosis not present

## 2023-07-25 DIAGNOSIS — Z8673 Personal history of transient ischemic attack (TIA), and cerebral infarction without residual deficits: Secondary | ICD-10-CM | POA: Diagnosis not present

## 2023-07-25 DIAGNOSIS — Z01812 Encounter for preprocedural laboratory examination: Secondary | ICD-10-CM

## 2023-07-25 DIAGNOSIS — L650 Telogen effluvium: Secondary | ICD-10-CM

## 2023-07-25 DIAGNOSIS — M6208 Separation of muscle (nontraumatic), other site: Secondary | ICD-10-CM | POA: Insufficient documentation

## 2023-07-25 DIAGNOSIS — Z9889 Other specified postprocedural states: Secondary | ICD-10-CM | POA: Diagnosis not present

## 2023-07-25 HISTORY — PX: INSERTION OF MESH: SHX5868

## 2023-07-25 LAB — POCT PREGNANCY, URINE: Preg Test, Ur: NEGATIVE

## 2023-07-25 SURGERY — REPAIR, HERNIA, UMBILICAL, ROBOT-ASSISTED
Anesthesia: General

## 2023-07-25 MED ORDER — MINOXIDIL 2.5 MG PO TABS: 2.5000 mg | ORAL_TABLET | Freq: Every day | ORAL | 3 refills | Status: DC

## 2023-07-25 MED ORDER — IBUPROFEN 600 MG PO TABS: 600.0000 mg | ORAL_TABLET | Freq: Three times a day (TID) | ORAL | 1 refills | Status: DC | PRN

## 2023-07-25 MED ORDER — MIDAZOLAM HCL 2 MG/2ML IJ SOLN
INTRAMUSCULAR | Status: AC
  Filled 2023-07-25: qty 2

## 2023-07-25 MED ORDER — OXYCODONE HCL 5 MG PO TABS
ORAL_TABLET | ORAL | Status: AC
  Filled 2023-07-25: qty 1

## 2023-07-25 MED ORDER — KETOROLAC TROMETHAMINE 30 MG/ML IJ SOLN
INTRAMUSCULAR | Status: AC
  Filled 2023-07-25: qty 1

## 2023-07-25 MED ORDER — ORAL CARE MOUTH RINSE: 15.0000 mL | Freq: Once | OROMUCOSAL | Status: AC

## 2023-07-25 MED ORDER — SODIUM CHLORIDE (PF) 0.9 % IJ SOLN
INTRAMUSCULAR | Status: DC | PRN
  Administered 2023-07-25: 80 mL

## 2023-07-25 MED ORDER — ONDANSETRON HCL 4 MG/2ML IJ SOLN
INTRAMUSCULAR | Status: DC | PRN
  Administered 2023-07-25: 4 mg via INTRAVENOUS

## 2023-07-25 MED ORDER — OXYCODONE HCL 5 MG PO TABS
5.0000 mg | ORAL_TABLET | Freq: Once | ORAL | Status: AC | PRN
Start: 2023-07-25 — End: 2023-07-25
  Administered 2023-07-25: 5 mg via ORAL

## 2023-07-25 MED ORDER — CEFAZOLIN SODIUM-DEXTROSE 2-4 GM/100ML-% IV SOLN
INTRAVENOUS | Status: AC
  Filled 2023-07-25: qty 100

## 2023-07-25 MED ORDER — PROPOFOL 10 MG/ML IV BOLUS
INTRAVENOUS | Status: DC | PRN
  Administered 2023-07-25: 150 mg via INTRAVENOUS

## 2023-07-25 MED ORDER — ACETAMINOPHEN 10 MG/ML IV SOLN: 1000.0000 mg | Freq: Once | INTRAVENOUS | Status: DC | PRN

## 2023-07-25 MED ORDER — CHLORHEXIDINE GLUCONATE 0.12 % MT SOLN
OROMUCOSAL | Status: AC
  Filled 2023-07-25: qty 15

## 2023-07-25 MED ORDER — DROPERIDOL 2.5 MG/ML IJ SOLN: 0.6250 mg | Freq: Once | INTRAMUSCULAR | Status: DC | PRN

## 2023-07-25 MED ORDER — CHLORHEXIDINE GLUCONATE 0.12 % MT SOLN
15.0000 mL | Freq: Once | OROMUCOSAL | Status: AC
  Administered 2023-07-25: 15 mL via OROMUCOSAL

## 2023-07-25 MED ORDER — GABAPENTIN 300 MG PO CAPS
300.0000 mg | ORAL_CAPSULE | ORAL | Status: AC
  Administered 2023-07-25: 300 mg via ORAL

## 2023-07-25 MED ORDER — SODIUM CHLORIDE (PF) 0.9 % IJ SOLN
INTRAMUSCULAR | Status: AC
  Filled 2023-07-25: qty 30

## 2023-07-25 MED ORDER — CEFAZOLIN SODIUM-DEXTROSE 2-4 GM/100ML-% IV SOLN
2.0000 g | INTRAVENOUS | Status: AC
  Administered 2023-07-25: 2 g via INTRAVENOUS

## 2023-07-25 MED ORDER — LACTATED RINGERS IV SOLN: INTRAVENOUS | Status: DC

## 2023-07-25 MED ORDER — CHLORHEXIDINE GLUCONATE CLOTH 2 % EX PADS: 6.0000 | MEDICATED_PAD | Freq: Once | CUTANEOUS | Status: DC

## 2023-07-25 MED ORDER — GABAPENTIN 300 MG PO CAPS
ORAL_CAPSULE | ORAL | Status: AC
  Filled 2023-07-25: qty 1

## 2023-07-25 MED ORDER — DEXAMETHASONE SODIUM PHOSPHATE 10 MG/ML IJ SOLN
INTRAMUSCULAR | Status: DC | PRN
  Administered 2023-07-25: 4 mg via INTRAVENOUS

## 2023-07-25 MED ORDER — BUPIVACAINE LIPOSOME 1.3 % IJ SUSP
INTRAMUSCULAR | Status: AC
  Filled 2023-07-25: qty 20

## 2023-07-25 MED ORDER — MIDAZOLAM HCL 2 MG/2ML IJ SOLN
INTRAMUSCULAR | Status: DC | PRN
  Administered 2023-07-25: 2 mg via INTRAVENOUS

## 2023-07-25 MED ORDER — FENTANYL CITRATE (PF) 100 MCG/2ML IJ SOLN
INTRAMUSCULAR | Status: AC
  Filled 2023-07-25: qty 2

## 2023-07-25 MED ORDER — FENTANYL CITRATE (PF) 100 MCG/2ML IJ SOLN
25.0000 ug | INTRAMUSCULAR | Status: DC | PRN
  Administered 2023-07-25 (×4): 25 ug via INTRAVENOUS

## 2023-07-25 MED ORDER — DEXMEDETOMIDINE HCL IN NACL 80 MCG/20ML IV SOLN
INTRAVENOUS | Status: DC | PRN
  Administered 2023-07-25: 4 ug via INTRAVENOUS
  Administered 2023-07-25: 8 ug via INTRAVENOUS
  Administered 2023-07-25: 4 ug via INTRAVENOUS

## 2023-07-25 MED ORDER — KETOROLAC TROMETHAMINE 30 MG/ML IJ SOLN
INTRAMUSCULAR | Status: DC | PRN
  Administered 2023-07-25: 30 mg via INTRAVENOUS

## 2023-07-25 MED ORDER — 0.9 % SODIUM CHLORIDE (POUR BTL) OPTIME
TOPICAL | Status: DC | PRN
  Administered 2023-07-25: 150 mL

## 2023-07-25 MED ORDER — BUPIVACAINE-EPINEPHRINE (PF) 0.5% -1:200000 IJ SOLN
INTRAMUSCULAR | Status: AC
  Filled 2023-07-25: qty 10

## 2023-07-25 MED ORDER — ACETAMINOPHEN 500 MG PO TABS: 1000.0000 mg | ORAL_TABLET | Freq: Four times a day (QID) | ORAL | Status: AC | PRN

## 2023-07-25 MED ORDER — LIDOCAINE HCL (CARDIAC) PF 100 MG/5ML IV SOSY
PREFILLED_SYRINGE | INTRAVENOUS | Status: DC | PRN
  Administered 2023-07-25: 50 mg via INTRAVENOUS

## 2023-07-25 MED ORDER — GABAPENTIN 300 MG PO CAPS: 300.0000 mg | ORAL_CAPSULE | ORAL | Status: DC

## 2023-07-25 MED ORDER — BUPIVACAINE LIPOSOME 1.3 % IJ SUSP
20.0000 mL | Freq: Once | INTRAMUSCULAR | Status: DC
Start: 2023-07-25 — End: 2023-07-25

## 2023-07-25 MED ORDER — SUGAMMADEX SODIUM 200 MG/2ML IV SOLN
INTRAVENOUS | Status: DC | PRN
  Administered 2023-07-25 (×2): 100 mg via INTRAVENOUS

## 2023-07-25 MED ORDER — OXYCODONE HCL 5 MG/5ML PO SOLN: 5.0000 mg | Freq: Once | ORAL | Status: AC | PRN

## 2023-07-25 MED ORDER — BUPIVACAINE LIPOSOME 1.3 % IJ SUSP
INTRAMUSCULAR | Status: AC
  Filled 2023-07-25: qty 10

## 2023-07-25 MED ORDER — OXYCODONE HCL 5 MG PO TABS: 5.0000 mg | ORAL_TABLET | ORAL | 0 refills | Status: DC | PRN

## 2023-07-25 MED ORDER — ACETAMINOPHEN 500 MG PO TABS
1000.0000 mg | ORAL_TABLET | ORAL | Status: AC
  Administered 2023-07-25: 1000 mg via ORAL

## 2023-07-25 MED ORDER — FENTANYL CITRATE (PF) 100 MCG/2ML IJ SOLN
INTRAMUSCULAR | Status: DC | PRN
  Administered 2023-07-25: 75 ug via INTRAVENOUS
  Administered 2023-07-25: 25 ug via INTRAVENOUS

## 2023-07-25 MED ORDER — ACETAMINOPHEN 500 MG PO TABS: 1000.0000 mg | ORAL_TABLET | ORAL | Status: DC

## 2023-07-25 MED ORDER — ROCURONIUM BROMIDE 100 MG/10ML IV SOLN
INTRAVENOUS | Status: DC | PRN
  Administered 2023-07-25: 10 mg via INTRAVENOUS
  Administered 2023-07-25: 50 mg via INTRAVENOUS
  Administered 2023-07-25 (×3): 10 mg via INTRAVENOUS

## 2023-07-25 MED ORDER — ACETAMINOPHEN 500 MG PO TABS
ORAL_TABLET | ORAL | Status: AC
  Filled 2023-07-25: qty 2

## 2023-07-25 SURGICAL SUPPLY — 50 items
BINDER ABDOMINAL 12 ML 46-62 (SOFTGOODS) IMPLANT
BLADE SURG SZ11 CARB STEEL (BLADE) ×2 IMPLANT
COVER TIP SHEARS 8 DVNC (MISCELLANEOUS) ×2 IMPLANT
COVER WAND RF STERILE (DRAPES) ×2 IMPLANT
DERMABOND ADVANCED .7 DNX12 (GAUZE/BANDAGES/DRESSINGS) ×2 IMPLANT
DRAPE ARM DVNC X/XI (DISPOSABLE) ×6 IMPLANT
DRAPE COLUMN DVNC XI (DISPOSABLE) ×2 IMPLANT
ELECT CAUTERY BLADE TIP 2.5 (TIP) ×2
ELECT REM PT RETURN 9FT ADLT (ELECTROSURGICAL) ×2
ELECTRODE CAUTERY BLDE TIP 2.5 (TIP) ×2 IMPLANT
ELECTRODE REM PT RTRN 9FT ADLT (ELECTROSURGICAL) ×2 IMPLANT
FORCEPS BPLR R/ABLATION 8 DVNC (INSTRUMENTS) ×2 IMPLANT
GLOVE SURG SYN 7.0 (GLOVE) ×4 IMPLANT
GLOVE SURG SYN 7.0 PF PI (GLOVE) ×4 IMPLANT
GLOVE SURG SYN 7.5 E (GLOVE) ×4 IMPLANT
GLOVE SURG SYN 7.5 PF PI (GLOVE) ×4 IMPLANT
GOWN STRL REUS W/ TWL LRG LVL3 (GOWN DISPOSABLE) ×6 IMPLANT
GRASPER SUT TROCAR 14GX15 (MISCELLANEOUS) IMPLANT
KIT PINK PAD W/HEAD ARE REST (MISCELLANEOUS) ×2
KIT PINK PAD W/HEAD ARM REST (MISCELLANEOUS) ×2 IMPLANT
LABEL OR SOLS (LABEL) ×2 IMPLANT
MANIFOLD NEPTUNE II (INSTRUMENTS) ×2 IMPLANT
MESH VENTRLGHT ELLIPSE 8X6XMFL (Mesh Specialty) IMPLANT
NDL DRIVE SUT CUT DVNC (INSTRUMENTS) ×2 IMPLANT
NDL HYPO 22X1.5 SAFETY MO (MISCELLANEOUS) ×2 IMPLANT
NDL INSUFFLATION 14GA 120MM (NEEDLE) ×2 IMPLANT
NEEDLE DRIVE SUT CUT DVNC (INSTRUMENTS) ×2 IMPLANT
NEEDLE HYPO 22X1.5 SAFETY MO (MISCELLANEOUS) ×2 IMPLANT
NEEDLE INSUFFLATION 14GA 120MM (NEEDLE) ×2 IMPLANT
OBTURATOR OPTICAL STND 8 DVNC (TROCAR) ×2
OBTURATOR OPTICALSTD 8 DVNC (TROCAR) ×2 IMPLANT
PACK LAP CHOLECYSTECTOMY (MISCELLANEOUS) ×2 IMPLANT
SCISSORS MNPLR CVD DVNC XI (INSTRUMENTS) ×2 IMPLANT
SEAL UNIV 5-12 XI (MISCELLANEOUS) ×4 IMPLANT
SET TUBE SMOKE EVAC HIGH FLOW (TUBING) ×2 IMPLANT
SOL ELECTROSURG ANTI STICK (MISCELLANEOUS) ×2
SOLUTION ELECTROSURG ANTI STCK (MISCELLANEOUS) ×2 IMPLANT
SUT MNCRL 4-0 27XMFL (SUTURE) ×2
SUT STRATA 2-0 30 CT-2 (SUTURE) ×4 IMPLANT
SUT STRATAFIX PDS 30 CT-1 (SUTURE) ×2 IMPLANT
SUT VIC AB 2-0 SH 27XBRD (SUTURE) IMPLANT
SUT VIC AB 3-0 SH 27X BRD (SUTURE) IMPLANT
SUT VICRYL 0 UR6 27IN ABS (SUTURE) ×4 IMPLANT
SUTURE MNCRL 4-0 27XMF (SUTURE) ×2 IMPLANT
SYR 20ML LL LF (SYRINGE) IMPLANT
SYS BAG RETRIEVAL 10MM (BASKET) ×2
SYSTEM BAG RETRIEVAL 10MM (BASKET) IMPLANT
TAPE TRANSPORE STRL 2 31045 (GAUZE/BANDAGES/DRESSINGS) ×2 IMPLANT
TRAY FOLEY SLVR 16FR LF STAT (SET/KITS/TRAYS/PACK) ×2 IMPLANT
WATER STERILE IRR 500ML POUR (IV SOLUTION) ×2 IMPLANT

## 2023-07-25 NOTE — Transfer of Care (Signed)
Immediate Anesthesia Transfer of Care Note  Patient: Janice Stewart  Procedure(s) Performed: XI ROBOT ASSISTED UMBILICAL HERNIA REPAIR,m plication of diastasis recti INSERTION OF MESH  Patient Location: PACU  Anesthesia Type:General  Level of Consciousness: sedated  Airway & Oxygen Therapy: Patient Spontanous Breathing  Post-op Assessment: Report given to RN and Post -op Vital signs reviewed and stable  Post vital signs: stable  Last Vitals:  Vitals Value Taken Time  BP 136/94 07/25/23 1645  Temp 97.1   Pulse 84 07/25/23 1646  Resp 16 07/25/23 1647  SpO2 100 % 07/25/23 1646  Vitals shown include unfiled device data.  Last Pain:  Vitals:   07/25/23 1258  TempSrc: Oral  PainSc: 0-No pain         Complications: No notable events documented.

## 2023-07-25 NOTE — Patient Instructions (Signed)
Continue minoxidil 2.5 mg daily.   Doses of minoxidil for hair loss are considered 'low dose'. This is because the doses used for hair loss are much lower than the doses which are used for conditions such as high blood pressure (hypertension). The doses used for hypertension are 10-40mg  per day.  Side effects are uncommon at the low doses (up to 2.5 mg/day) used to treat hair loss. Potential side effects, more commonly seen at higher doses, include: Increase in hair growth (hypertrichosis) elsewhere on face and body Temporary hair shedding upon starting medication which may last up to 4 weeks Ankle swelling, fluid retention, rapid weight gain more than 5 pounds Low blood pressure and feeling lightheaded or dizzy when standing up quickly Fast or irregular heartbeat Headaches  Due to recent changes in healthcare laws, you may see results of your pathology and/or laboratory studies on MyChart before the doctors have had a chance to review them. We understand that in some cases there may be results that are confusing or concerning to you. Please understand that not all results are received at the same time and often the doctors may need to interpret multiple results in order to provide you with the best plan of care or course of treatment. Therefore, we ask that you please give [place] 2 business days to thoroughly review all your results before contacting the office for clarification. Should we see a critical lab result, you will be contacted sooner.   If You Need Anything After Your Visit  If you have any questions or concerns for your doctor, please call our main line at [phone] and press option 4 to reach your doctor's medical assistant. If no one answers, please leave a voicemail as directed and we will return your call as soon as possible. Messages left after 4 pm will be answered the following business day.   You may also send [place] a message via MyChart. We typically respond to MyChart messages  within 1-2 business days.  For prescription refills, please ask your pharmacy to contact our office. Our fax number is [phone].  If you have an urgent issue when the clinic is closed that cannot wait until the next business day, you can page your doctor at the number below.    Please note that while we do our best to be available for urgent issues outside of office hours, we are not available 24/7.   If you have an urgent issue and are unable to reach [place], you may choose to seek medical care at your doctor's office, retail clinic, urgent care center, or emergency room.  If you have a medical emergency, please immediately call 911 or go to the emergency department.  Pager Numbers  - Dr. Gwen Pounds: [phone]  - Dr. Roseanne Reno: [phone]  - Dr. Katrinka Blazing: [phone]   In the event of inclement weather, please call our main line at [phone] for an update on the status of any delays or closures.  Dermatology Medication Tips: Please keep the boxes that topical medications come in in order to help keep track of the instructions about where and how to use these. Pharmacies typically print the medication instructions only on the boxes and not directly on the medication tubes.   If your medication is too expensive, please contact our office at [phone] option 4 or send [place] a message through MyChart.   We are unable to tell what your co-pay for medications will be in advance as this is different depending on your insurance  coverage. However, we may be able to find a substitute medication at lower cost or fill out paperwork to get insurance to cover a needed medication.   If a prior authorization is required to get your medication covered by your insurance company, please allow [place] 1-2 business days to complete this process.  Drug prices often vary depending on where the prescription is filled and some pharmacies may offer cheaper prices.  The website www.goodrx.com contains coupons  for medications through different pharmacies. The prices here do not account for what the cost may be with help from insurance (it may be cheaper with your insurance), but the website can give you the price if you did not use any insurance.  - You can print the associated coupon and take it with your prescription to the pharmacy.  - You may also stop by our office during regular business hours and pick up a GoodRx coupon card.  - If you need your prescription sent electronically to a different pharmacy, notify our office through [facility] or by phone at [phone] option 4.     Si Usted Necesita Algo Despus de Su Visita  Tambin puede enviarnos un mensaje a travs de Clinical cytogeneticist. Por lo general respondemos a los mensajes de MyChart en el transcurso de 1 a 2 das hbiles.  Para renovar recetas, por favor pida a su farmacia que se ponga en contacto con nuestra oficina. Annie Sable de fax es [place] [phone].  Si tiene un asunto urgente cuando la clnica est cerrada y que no puede esperar hasta el siguiente da hbil, puede llamar/localizar a su doctor(a) al nmero que aparece a continuacin.   Por favor, tenga en cuenta que aunque hacemos todo lo posible para estar disponibles para asuntos urgentes fuera del horario de [place], no estamos disponibles las 24 horas del da, los 7 [address]  [address] de la [place].   Si tiene un problema urgente y no puede comunicarse con nosotros, puede optar por buscar atencin mdica  en el consultorio de su doctor(a), en una clnica privada, en un centro de atencin urgente o en una sala de emergencias.  Si tiene Engineer, drilling, por favor llame inmediatamente al 911 o vaya a la sala de emergencias.  Nmeros de bper  - Dr. Gwen Pounds: [phone]  - Dra. Roseanne Reno: [phone]  - Dr. Katrinka Blazing: [phone]   En caso de inclemencias del tiempo, por favor llame a Lacy Duverney principal al [phone] para una actualizacin sobre el [place] de cualquier  retraso o cierre.  Consejos para la medicacin en dermatologa: Por favor, guarde las cajas en las que vienen los medicamentos de uso tpico para ayudarle a seguir las instrucciones sobre dnde y cmo usarlos. Las farmacias generalmente imprimen las instrucciones del medicamento slo en las cajas y no directamente en los tubos del [place].   Si su medicamento es muy caro, por favor, pngase en contacto con Rolm Gala llamando al [phone] y presione la opcin 4 o envenos un mensaje a travs de Clinical cytogeneticist.   No podemos decirle cul ser su copago por los medicamentos por adelantado ya que esto es diferente dependiendo de la cobertura de su seguro. Sin embargo, es posible que podamos encontrar un medicamento sustituto a Audiological scientist un formulario para que el seguro cubra el medicamento que se considera necesario.   Si se requiere una autorizacin previa para que su compaa de seguros [place] su medicamento, por favor permtanos de 1 a 2 das hbiles para completar [address]  Los  precios de los medicamentos varan con frecuencia dependiendo del lugar de dnde se surte la receta y alguna farmacias pueden ofrecer precios ms baratos.  El sitio web www.goodrx.com tiene cupones para medicamentos de Health and safety inspector. Los precios aqu no tienen en cuenta lo que podra costar con la ayuda del seguro (puede ser ms barato con su seguro), pero el sitio web puede darle el precio si no utiliz Tourist information centre manager.  - Puede imprimir el cupn correspondiente y llevarlo con su receta a la farmacia.  - Tambin puede pasar por nuestra oficina durante el horario de atencin regular y Education officer, museum una tarjeta de cupones de GoodRx.  - Si necesita que su receta se enve electrnicamente a una farmacia diferente, informe a nuestra oficina a travs de MyChart de [place] o por telfono llamando al [phone] y presione la opcin 4.

## 2023-07-25 NOTE — Anesthesia Preprocedure Evaluation (Signed)
Anesthesia Evaluation  Patient identified by MRN, date of birth, ID band Patient awake    Reviewed: Allergy & Precautions, H&P , NPO status , Patient's Chart, lab work & pertinent test results, reviewed documented beta blocker date and time   Airway Mallampati: II  TM Distance: >3 FB Neck ROM: full    Dental  (+) Teeth Intact   Pulmonary neg pulmonary ROS   Pulmonary exam normal        Cardiovascular Exercise Tolerance: Good hypertension, On Medications negative cardio ROS Normal cardiovascular exam Rhythm:regular Rate:Normal     Neuro/Psych  Headaches  Neuromuscular disease CVA  negative psych ROS   GI/Hepatic negative GI ROS, Neg liver ROS,,,  Endo/Other  negative endocrine ROS    Renal/GU negative Renal ROS  negative genitourinary   Musculoskeletal   Abdominal   Peds  Hematology  (+) Blood dyscrasia, anemia   Anesthesia Other Findings Past Medical History: No date: Anemia 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 06/2023: Diastasis recti No date: Essential hypertension 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) No date: Headache 05/2023: Left inguinal hernia No date: Sickle cell trait (HCC) 2017: Stroke (HCC)     Comment:  around the time of chemotherapy 05/2023: Umbilical hernia Past Surgical History: 2017: LIVER BIOPSY 2017: LUNG BIOPSY 2018: PORT-A-CATH REMOVAL 2017: PORTA CATH INSERTION 06/20/2023: ROBOT ASSISTED INGUINAL HERNIA REPAIR; Left     Comment:  AND Creation of Left Posterior Rectus-Transversalis               Fascia Advancment Flap for Coverage of Pelvic Wound (200               cm)   Reproductive/Obstetrics negative OB ROS                             Anesthesia Physical Anesthesia Plan  ASA: 3  Anesthesia Plan: General ETT   Post-op Pain Management:    Induction:   PONV Risk Score and Plan:   Airway Management Planned:   Additional  Equipment:   Intra-op Plan:   Post-operative Plan:   Informed Consent: I have reviewed the patients History and Physical, chart, labs and discussed the procedure including the risks, benefits and alternatives for the proposed anesthesia with the patient or authorized representative who has indicated his/her understanding and acceptance.     Dental Advisory Given  Plan Discussed with: CRNA  Anesthesia Plan Comments:        Anesthesia Quick Evaluation

## 2023-07-25 NOTE — Anesthesia Procedure Notes (Signed)
Procedure Name: Intubation Date/Time: 07/25/2023 1:25 PM  Performed by: Darrell Jewel I, CRNAPre-anesthesia Checklist: Patient identified, Emergency Drugs available, Suction available, Patient being monitored and Timeout performed Patient Re-evaluated:Patient Re-evaluated prior to induction Oxygen Delivery Method: Circle system utilized Preoxygenation: Pre-oxygenation with 100% oxygen Induction Type: IV induction Ventilation: Mask ventilation without difficulty Laryngoscope Size: Mac and 3 Grade View: Grade I Tube type: Oral Tube size: 7.0 mm Number of attempts: 1 Airway Equipment and Method: Patient positioned with wedge pillow and Stylet Placement Confirmation: ETT inserted through vocal cords under direct vision, positive ETCO2, breath sounds checked- equal and bilateral and CO2 detector Secured at: 21 cm Tube secured with: Tape Dental Injury: Teeth and Oropharynx as per pre-operative assessment

## 2023-07-25 NOTE — Op Note (Signed)
Procedure Date:  07/25/2023  Pre-operative Diagnosis:  Umbilical hernia and diastasis recti  Post-operative Diagnosis:  Reducible umbilical hernia, 3.2 cm, and diastasis recti with separation 5 cm.  Procedure:  Robotic assisted Umbilical Hernia Repair with mesh and plication of diastasis recti.  Surgeon:  Howie Ill, MD  Anesthesia:  General endotracheal  Estimated Blood Loss:  10 ml  Specimens:  None  Complications:  None  Indications for Procedure:  This is a 37 y.o. female who presents with an umbilical hernia in the setting of diastasis recti.  She wishes to have both corrected.  The options of surgery versus observation were reviewed with the patient and/or family. The risks of bleeding, abscess or infection, recurrence of symptoms, potential for an open procedure, injury to surrounding structures, and chronic pain were all discussed with the patient and was willing to proceed.  Description of Procedure: The patient was correctly identified in the preoperative area and brought into the operating room.  The patient was placed supine with VTE prophylaxis in place.  Appropriate time-outs were performed.  Anesthesia was induced and the patient was intubated.  Appropriate antibiotics were infused.  The abdomen was prepped and draped in a sterile fashion. The patient's hernia defect was marked with a marking pen.  A Veress needle was introduced in the left upper quadrant and pneumoperitoneum was obtained with appropriate pressures.  Using Optiview technique, an 8 mm port was introduced in the left lateral abdominal wall without complications.  Then, a 12 mm port was introduced in the left upper quadrant and an 8 mm port in the left lower quadrant under direct visualization.  The DaVinci platform was docked, camera targeted, and instruments placed under direct visualization.  The patient's umbilical hernia was fully reduced and the peritoneum and preperitoneal fat underlying the  diastasis recti were dissected and resected to allow better exposure of the hernia defect and for better mesh placement.  The hernia defect measured 3.2 cm,  and the diastasis was about 5 cm wide at its widest point just proximal to the umbilicus. Two 0 stratafix sutures were inserted.  The hernia defect was closed using the stratafix suture and in doing so, the diastasis recti was plicated along its entire length.  A 6 x 8 inch Bard Ventralight ST mesh was opened and cut to 10 x 20 cm size.  The mesh was inserted along with four 2-0 Stratafix sutures.  The mesh was then sutured in place circumferentially and through the center of the mesh using the sutures.  All needles were then removed through the 12 mm port without complications.  The preperitoneal fat was placed in an endocatch bag.  The DaVinci platform was then undocked and instruments removed.    80 ml of Exparel solution mixed with 0.5% bupivacaine with epi was infiltrated around the mesh edges, hernia repair site, and port sites.  The 12 mm port was removed and bag retrieved.  The fascia was closed under direct visualization utilizing an Endo Close technique with 0 Vicryl suture.  The 8 mm ports were removed. The 12 mm incision was closed using 3-0 Vicryl and 4-0 Monocryl, and the other port incisions were closed with 4-0 Monocryl.  The wounds were cleaned and sealed with DermaBond.  The patient was emerged from anesthesia and extubated and brought to the recovery room for further management.  The patient tolerated the procedure well and all counts were correct at the end of the case.   Howie Ill, MD

## 2023-07-25 NOTE — Discharge Instructions (Signed)
Discharge Instructions: 1.  Patient may shower, but do not scrub wounds heavily and dab dry only. 2.  Do not submerge wounds in pool/tub until fully healed. 3.  Do not apply ointments or hydrogen peroxide to the wounds. 4.  May apply ice packs to the wounds for comfort. 5.  Please wear the abdominal binder at all times for the next 4 weeks to allow the area to heal better.  May remove for showers. 6.  Do not drive while taking narcotics for pain control.  Prior to driving, make sure you are able to rotate right and left to look at blindspots without significant pain or discomfort. 7.  No heavy lifting or pushing of more than 10-15 lbs for 6 weeks.

## 2023-07-25 NOTE — Interval H&P Note (Signed)
History and Physical Interval Note:  07/25/2023 12:43 PM  Janice Stewart  has presented today for surgery, with the diagnosis of umbilical hernia reducible less 3 cm.  The various methods of treatment have been discussed with the patient and family. After consideration of risks, benefits and other options for treatment, the patient has consented to  Procedure(s): XI ROBOT ASSISTED UMBILICAL HERNIA REPAIR,m plication of diastasis recti (N/A) as a surgical intervention.  The patient's history has been reviewed, patient examined, no change in status, stable for surgery.  I have reviewed the patient's chart and labs.  Questions were answered to the patient's satisfaction.     [name]

## 2023-07-25 NOTE — Progress Notes (Signed)
Follow-Up Visit   Subjective  Janice Stewart is a 37 y.o. female who presents for the following: telogen effluvium. Patient taking 2.5 mg of minoxidil and Biotin daily, using a shampoo with Biotin in it as well as using almond and castor oil. Patient feels like hair loss is much less. No side effects.  The patient has spots, moles and lesions to be evaluated, some may be new or changing and the patient may have concern these could be cancer.   The following portions of the chart were reviewed this encounter and updated as appropriate: medications, allergies, medical history  Review of Systems:  No other skin or systemic complaints except as noted in HPI or Assessment and Plan.  Objective  Well appearing patient in no apparent distress; mood and affect are within normal limits.   A focused examination was performed of the following areas: scalp  Relevant exam findings are noted in the Assessment and Plan.    Assessment & Plan   Alopecia Scalp          Telogen Effluvium  Patient noticed hair loss after birth of 2nd child. It has been 3 years and hair loss has increased and is getting thinner. No new medications. Patient had IUD inserted 4 months ago. She has been seeing a nutritionist over the last month and has lost 4 pounds by changing eating habits. 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 Also with hx of stroke, Vitamin D deficiency, high blood pressure.    Chronic and persistent condition with duration or expected duration over one year. Condition is improving with treatment but not currently at goal.  Counseling Telogen effluvium is a benign, self-limited condition causing increased hair shedding usually for several months. It does not progress to baldness, and the hair eventually grows back on its own. It can be triggered by recent illness, recent surgery, thyroid disease, low iron stores,  vitamin D deficiency, fad diets or rapid weight loss, hormonal changes such as pregnancy or birth control pills, and some medication. Usually the hair loss starts 2-3 months after the illness or health change. Rarely, it can continue for longer than a year. Treatments options may include oral or topical Minoxidil; Red Light scalp treatments; Biotin 2.5 mg daily and other options.    Continue OTC Biotin (at least) 2.5 mg daily.  Continue minoxidil 2.5 mg tablet daily. Patient advised to discontinue if becomes pregnant.    No ankle swelling today.   Doses of minoxidil for hair loss are considered 'low dose'. This is because the doses used for hair loss are much lower than the doses which are used for conditions such as high blood pressure (hypertension). The doses used for hypertension are 10-40mg  per day.  Side effects are uncommon at the low doses (up to 2.5 mg/day) used to treat hair loss. Potential side effects, more commonly seen at higher doses, include: Increase in hair growth (hypertrichosis) elsewhere on face and body Temporary hair shedding upon starting medication which may last up to 4 weeks Ankle swelling, fluid retention, rapid weight gain more than 5 pounds Low blood pressure and feeling lightheaded or dizzy when standing up quickly Fast or irregular heartbeat Headaches  TELOGEN EFFLUVIUM   Related Medications minoxidil (LONITEN) 2.5 MG tablet Take 1 tablet (2.5 mg total) by mouth daily. MEDICATION MANAGEMENT    Return in about 1 year (around 07/24/2024) for Alopecia.  Anise Salvo,  RMA, am acting as scribe for Armida Sans, MD .   Documentation: I have reviewed the above documentation for accuracy and completeness, and I agree with the above.  Armida Sans, MD

## 2023-07-25 NOTE — Anesthesia Postprocedure Evaluation (Signed)
Anesthesia Post Note  Patient: Janice Stewart  Procedure(s) Performed: XI ROBOT ASSISTED UMBILICAL HERNIA REPAIR,m plication of diastasis recti INSERTION OF MESH  Patient location during evaluation: PACU Anesthesia Type: General Level of consciousness: awake and alert, oriented and patient cooperative Pain management: pain level controlled Vital Signs Assessment: post-procedure vital signs reviewed and stable Respiratory status: spontaneous breathing, nonlabored ventilation and respiratory function stable Cardiovascular status: blood pressure returned to baseline and stable Postop Assessment: adequate PO intake Anesthetic complications: no   No notable events documented.   Last Vitals:  Vitals:   07/25/23 1705 07/25/23 1710  BP:    Pulse: 86 94  Resp: 12 17  Temp:    SpO2: 100% 97%    Last Pain:  Vitals:   07/25/23 1710  TempSrc:   PainSc: 6                  Reed Breech

## 2023-07-26 ENCOUNTER — Encounter: Payer: Self-pay | Admitting: Surgery

## 2023-07-28 ENCOUNTER — Encounter: Payer: Self-pay | Admitting: Dermatology

## 2023-07-29 ENCOUNTER — Telehealth: Payer: Self-pay | Admitting: *Deleted

## 2023-07-29 NOTE — Telephone Encounter (Signed)
Spoke with the patient and he will pick this up tomorrow.

## 2023-07-29 NOTE — Telephone Encounter (Signed)
Patients spouse Alain Marion) called and stated that he needs to get a letter stating that his wife had surgery on 06/20/23 and he was out of work with her from 06/20/23 to 06/27/23. Patient would like to pick up a copy and also please let me know so I can fax it to his [employer] company.

## 2023-08-09 ENCOUNTER — Other Ambulatory Visit: Payer: Self-pay | Admitting: Family Medicine

## 2023-08-12 ENCOUNTER — Ambulatory Visit (INDEPENDENT_AMBULATORY_CARE_PROVIDER_SITE_OTHER): Payer: Medicaid Other | Admitting: Surgery

## 2023-08-12 ENCOUNTER — Encounter: Payer: Self-pay | Admitting: Surgery

## 2023-08-12 VITALS — BP 131/78 | HR 75 | Temp 98.0°F | Ht 69.0 in | Wt 198.0 lb

## 2023-08-12 DIAGNOSIS — Z09 Encounter for follow-up examination after completed treatment for conditions other than malignant neoplasm: Secondary | ICD-10-CM | POA: Diagnosis not present

## 2023-08-12 DIAGNOSIS — K429 Umbilical hernia without obstruction or gangrene: Secondary | ICD-10-CM | POA: Diagnosis not present

## 2023-08-12 DIAGNOSIS — M6208 Separation of muscle (nontraumatic), other site: Secondary | ICD-10-CM

## 2023-08-12 NOTE — Patient Instructions (Signed)

## 2023-08-12 NOTE — Progress Notes (Signed)
08/12/2023  History of Present Illness: Janice Stewart is a 37 y.o. female robotic assisted umbilical hernia repair with plication of diastases recti on 07/25/2023.  Patient presents today for follow-up.  She reports that she has been doing well.  Initially was having more discomfort but now is doing better.  The abdominal binder is starting to get more annoying to her but otherwise denies any complications at this point.  Past Medical History: Past Medical History:  Diagnosis Date   Anemia    Bone infarct (HCC)    S1 vertebrae   Diastasis recti 06/2023   Essential hypertension    Gestational choriocarcinoma (HCC) 2017   Headache    Left inguinal hernia 05/2023   Sickle cell trait (HCC)    Stroke (HCC) 2017   around the time of chemotherapy   Umbilical hernia 05/2023     Past Surgical History: Past Surgical History:  Procedure Laterality Date   INSERTION OF MESH  07/25/2023   Procedure: INSERTION OF MESH;  Surgeon: Henrene Dodge, MD;  Location: [facility] ORS;  Service: General;;   LIVER BIOPSY  2017   LUNG BIOPSY  2017   PORT-A-CATH REMOVAL  2018   PORTA CATH INSERTION  2017   ROBOT ASSISTED INGUINAL HERNIA REPAIR Left 06/20/2023   AND Creation of Left Posterior Rectus-Transversalis Fascia Advancment Flap for Coverage of Pelvic Wound (200 cm)    Home Medications: Prior to Admission medications   Medication Sig Start Date End Date Taking? Authorizing Provider  acetaminophen (TYLENOL) 500 MG tablet Take 2 tablets (1,000 mg total) by mouth every 6 (six) hours as needed for mild pain (pain score 1-3). 07/25/23  Yes Jakerria Kingbird, Elita Quick, MD  BIOTIN PO Take 1 tablet by mouth daily.   Yes [provider]  Cholecalciferol (VITAMIN D3) 50 MCG (2000 UT) CHEW Chew 2,000 Units by mouth in the morning.   Yes [provider]  folic acid (FOLVITE) 1 MG tablet Take 1 mg by mouth daily.   Yes [provider]  ibuprofen (ADVIL) 600 MG tablet Take 1 tablet (600 mg  total) by mouth every 8 (eight) hours as needed for moderate pain (pain score 4-6). 07/25/23  Yes Jaydin Jalomo, Elita Quick, MD  minoxidil (LONITEN) 2.5 MG tablet Take 1 tablet (2.5 mg total) by mouth daily. 07/25/23  Yes Deirdre Evener, MD  Multiple Vitamin (MULTIVITAMIN WITH MINERALS) TABS tablet Take 1 tablet by mouth in the morning.   Yes [provider]  paragard intrauterine copper IUD IUD 1 each by Intrauterine route once.   Yes [provider]  valsartan (DIOVAN) 80 MG tablet Take 1 tablet by mouth once daily 06/17/23  Yes Rema Fendt, NP    Allergies: No Known Allergies  Review of Systems: Review of Systems  Constitutional:  Negative for chills and fever.  Respiratory:  Negative for shortness of breath.   Cardiovascular:  Negative for chest pain.  Gastrointestinal:  Negative for abdominal pain, nausea and vomiting.    Physical Exam BP 131/78   Pulse 75   Temp 98 F (36.7 C)   Ht 5\' 9"  (1.753 m)   Wt 198 lb (89.8 kg)   LMP 07/10/2023 (Exact Date)   SpO2 96%   BMI 29.24 kg/m  CONSTITUTIONAL: No acute distress, well-nourished HEENT:  Normocephalic, atraumatic, extraocular motion intact. RESPIRATORY:  Normal respiratory effort without pathologic use of accessory muscles. CARDIOVASCULAR: Regular rhythm and rate. GI: The abdomen is soft, nondistended, appropriately sore to palpation.  Left-sided incisions  are healing well and are clean, dry, intact.  Umbilical area without any recurrence of hernia.  Rectus muscles are well-approximated without any visible diastases on straining.  NEUROLOGIC:  Motor and sensation is grossly normal.  Cranial nerves are grossly intact. PSYCH:  Alert and oriented to person, place and time. Affect is normal.   Assessment and Plan: This is a 37 y.o. female status post robotic assisted umbilical hernia repair with plication of diastases recti.  - Patient is recovering well from her surgery.  No complications from her incisions and no  evidence of hernia recurrence or diastases recurrence. - Discussed with her that she can now start sleeping without the abdominal binder on but would recommend continuing having it on during the daytime when she is more active.  At 4 weeks after surgery, she does not need the binder anymore. - Discussed with her activity restrictions - Follow-up as needed.  I spent 20 minutes dedicated to the care of this patient on the date of this encounter to include pre-visit review of records, face-to-face time with the patient discussing diagnosis and management, and any post-visit coordination of care.   Howie Ill, MD [facility]

## 2023-08-13 ENCOUNTER — Encounter: Payer: Self-pay | Admitting: Surgery

## 2023-08-15 NOTE — Telephone Encounter (Signed)
 Folic Acid appears as historical medication. Schedule appointment.

## 2023-08-16 NOTE — Telephone Encounter (Signed)
 Folic Acid appears as historical medication. Schedule appointment.

## 2023-08-26 ENCOUNTER — Encounter: Payer: Self-pay | Admitting: Surgery

## 2023-08-30 ENCOUNTER — Encounter: Payer: Self-pay | Admitting: Surgery

## 2023-08-30 ENCOUNTER — Ambulatory Visit (INDEPENDENT_AMBULATORY_CARE_PROVIDER_SITE_OTHER): Payer: Medicaid Other | Admitting: Surgery

## 2023-08-30 VITALS — BP 124/82 | HR 93 | Temp 98.4°F | Ht 69.0 in | Wt 198.8 lb

## 2023-08-30 DIAGNOSIS — K429 Umbilical hernia without obstruction or gangrene: Secondary | ICD-10-CM

## 2023-08-30 DIAGNOSIS — Z09 Encounter for follow-up examination after completed treatment for conditions other than malignant neoplasm: Secondary | ICD-10-CM

## 2023-08-30 DIAGNOSIS — M6208 Separation of muscle (nontraumatic), other site: Secondary | ICD-10-CM

## 2023-08-30 NOTE — Progress Notes (Signed)
08/30/2023  History of Present Illness: Janice Stewart is a 38 y.o. female status post robotic assisted umbilical hernia repair with plication of diastases recti on 07/25/2023.  Overall she had a hernia defect measuring 3.2 cm in size.  She presents today for follow-up.  She had sent a MyChart message earlier this week reporting a rash particularly around the left-sided incisions that was itching quite significantly extending down towards her upper thigh.  She was recommended to use Benadryl and hydrocortisone ointment and she has been doing this which has been helping.  However she has noticed that she is formed bruising in the left upper thigh and groin areas.  Appointment was set up as a precaution to further evaluate her.  She reports that the itching and the rash has been improving and the bruising also is improving but there is still some itchiness reported.  She thought perhaps it was related to the abdominal binder but when she remove the binder last week, she felt the itching got worse.  Past Medical History: Past Medical History:  Diagnosis Date   Anemia    Bone infarct (HCC)    S1 vertebrae   Diastasis recti 06/2023   Essential hypertension    Gestational choriocarcinoma (HCC) 2017   Headache    Left inguinal hernia 05/2023   Sickle cell trait (HCC)    Stroke (HCC) 2017   around the time of chemotherapy   Umbilical hernia 05/2023     Past Surgical History: Past Surgical History:  Procedure Laterality Date   INSERTION OF MESH  07/25/2023   Procedure: INSERTION OF MESH;  Surgeon: Henrene Dodge, MD;  Location: [facility] ORS;  Service: General;;   LIVER BIOPSY  2017   LUNG BIOPSY  2017   PORT-A-CATH REMOVAL  2018   PORTA CATH INSERTION  2017   ROBOT ASSISTED INGUINAL HERNIA REPAIR Left 06/20/2023   AND Creation of Left Posterior Rectus-Transversalis Fascia Advancment Flap for Coverage of Pelvic Wound (200 cm)    Home Medications: Prior to Admission medications    Medication Sig Start Date End Date Taking? Authorizing Provider  acetaminophen (TYLENOL) 500 MG tablet Take 2 tablets (1,000 mg total) by mouth every 6 (six) hours as needed for mild pain (pain score 1-3). 07/25/23  Yes Nicklas Mcsweeney, Elita Quick, MD  BIOTIN PO Take 1 tablet by mouth daily.   Yes [provider]  Cholecalciferol (VITAMIN D3) 50 MCG (2000 UT) CHEW Chew 2,000 Units by mouth in the morning.   Yes [provider]  folic acid (FOLVITE) 1 MG tablet Take 1 mg by mouth daily.   Yes [provider]  ibuprofen (ADVIL) 600 MG tablet Take 1 tablet (600 mg total) by mouth every 8 (eight) hours as needed for moderate pain (pain score 4-6). 07/25/23  Yes Romell Wolden, Elita Quick, MD  minoxidil (LONITEN) 2.5 MG tablet Take 1 tablet (2.5 mg total) by mouth daily. 07/25/23  Yes Deirdre Evener, MD  Multiple Vitamin (MULTIVITAMIN WITH MINERALS) TABS tablet Take 1 tablet by mouth in the morning.   Yes [provider]  paragard intrauterine copper IUD IUD 1 each by Intrauterine route once.   Yes [provider]  valsartan (DIOVAN) 80 MG tablet Take 1 tablet by mouth once daily 06/17/23  Yes Rema Fendt, NP    Allergies: No Known Allergies  Review of Systems: Review of Systems  Constitutional:  Negative for chills and fever.  Respiratory:  Negative for shortness of breath.   Cardiovascular:  Negative for chest pain.  Gastrointestinal:  Negative for abdominal pain, nausea and vomiting.  Skin:  Positive for itching and rash.    Physical Exam BP 124/82   Pulse 93   Temp 98.4 F (36.9 C) (Oral)   Ht 5\' 9"  (1.753 m)   Wt 198 lb 12.8 oz (90.2 kg)   SpO2 95%   BMI 29.36 kg/m  CONSTITUTIONAL: No acute distress, well-nourished HEENT:  Normocephalic, atraumatic, extraocular motion intact. RESPIRATORY:  Normal respiratory effort without pathologic use of accessory muscles. CARDIOVASCULAR: Regular rhythm and rate GI: The abdomen is soft, nondistended, nontender to  palpation.  Patient has splotchy darkening of her skin around the left sided incisions which is likely related to prior scratching and itching and now the skin is drier.  She also has some ecchymosis in the left groin and upper left thigh in linear fashion with some petechial changes at the skin level.  No evidence of any hernia recurrence in the rectus muscle appear well plicated.  NEUROLOGIC:  Motor and sensation is grossly normal.  Cranial nerves are grossly intact. PSYCH:  Alert and oriented to person, place and time. Affect is normal.  Assessment and Plan: This is a 38 y.o. female status post robotic assisted umbilical hernia repair with plication of diastases recti.  - Discussed with the patient is unclear why she developed a rash on the left abdomen particular in the left upper thigh area.  This started weeks after her actual surgery so would be unusual diarrhea is related to any of the local anesthesia, sutures, or Dermabond.  I think it would make more sense that the abdominal binder continue rubbing or irritating the skin which led to the itching.  Unfortunately we may not ever have the true etiology for this.  However this has been improving with Benadryl and hydrocortisone ointment.  Discussed with patient that she can continue this as she could use Benadryl ointment instead of pills so that she does not have the drowsiness associated with p.o. Benadryl.  Once the itchiness has resolved, then she can stop using the medications.  Also discussed that eventually would be helpful to apply moisturizing lotion or vitamin E lotion to the skin so that the dryness can improve and the discoloration can also resolve. - Follow-up as needed.  I spent 10 minutes dedicated to the care of this patient on the date of this encounter to include pre-visit review of records, face-to-face time with the patient discussing diagnosis and management, and any post-visit coordination of care.   Howie Ill,  MD [facility]

## 2023-08-30 NOTE — Patient Instructions (Signed)
Erupcin cutnea en los adultos Rash, Adult  [place] erupcin cutnea es una erupcin de manchas o parches en la piel. Es un cambio en el aspecto y la sensibilidad de la piel. Muchas cosas pueden ocasionar una erupcin cutnea. El objetivo del tratamiento es calmar la picazn y evitar que la erupcin se propague. Siga estas instrucciones en su casa: Medicamentos Use o aplique los medicamentos de venta libre y los recetados solamente como se lo haya indicado el mdico. Estos podran incluir medicamentos para tratar lo siguiente: Piel enrojecida e hinchada. Picazn. Vella Raring. Dolor. Una infeccin.  Cuidado de la piel Aplique paos fros y hmedos en la zona de la erupcin. No se rasque ni se frote la piel. Trate de no cubrir la zona de la erupcin. Mantngala expuesta al aire tanto como sea posible. Control de la picazn y las [employer] baos o las duchas calientes. Estos pueden empeorar la picazn. Neomia Dear ducha fra puede Acupuncturist. Trate de tomar un bao con lo siguiente: Sales de [place]. Puede conseguirlas en la tienda de comestibles o en la farmacia. Siga las instrucciones del envase. Bicarbonato de sodio. Vierta un poco en la baera como se lo haya indicado el mdico. Avena coloidal. Puede conseguirla en la tienda de comestibles o en la farmacia. Siga las instrucciones del envase. Intente colocarse una pasta de bicarbonato de [employer]. Agregue agua al bicarbonato de sodio hasta que se forme una pasta. Intente aplicarse una locin para Associate Professor (locin de calamina). Mantngase fresco. Evite exponerse al sol. La transpiracin y el calor pueden empeorar la picazn. Instrucciones generales  Descanse todo lo que sea necesario. Beba suficiente lquido para Radio producer pis (orina) de color amarillo plido. Use ropa suelta. Evite los detergentes y los jabones perfumados, y los perfumes. Utilice jabones, detergentes, perfumes y cosmticos suaves. Evite  usar lo que ha causado la erupcin. Lleve un diario como ayuda para registrar lo que le causa erupcin. Escriba los siguientes datos: Lo que come. Qu productos cosmticos [place]. Lo que bebe. La ropa que [place]. Esto incluye las alhajas. Comunquese con un mdico si: Transpira mucho de noche. Hace pis (orina) ms o menos de lo normal. La orina es de color ms oscuro que lo normal. Los ojos tienen sensibilidad a Statistician. La piel y la parte blanca de los ojos se ponen amarillos. Tiene adormecimiento o siente hormigueo en la piel. Tiene ampollas dolorosas en la nariz o la boca. La erupcin cutnea no desaparece despus de [employer]. Est ms cansado que lo habitual. Est ms sediento que lo habitual. Tiene sntomas nuevos o estos empeoran. Pueden incluir: Tree surgeon. Grant Ruts. Materia fecal lquida (diarrea). Vmitos. Debilidad. Prdida de peso. Solicite ayuda de inmediato si: Empieza a sentirse desorientado (confundido). Siente un dolor de cabeza intenso o tiene rigidez en el cuello. Siente mucho dolor o rigidez en las articulaciones. Se siente muy somnoliento o no reacciona. Tiene una convulsin. Esta informacin no tiene Theme park manager el consejo del mdico. Asegrese de hacerle al mdico cualquier pregunta que tenga. Document Revised: 09/08/2022 Document Reviewed: 09/08/2022 Elsevier Patient Education  2024 [employer].

## 2023-09-02 ENCOUNTER — Other Ambulatory Visit: Payer: Self-pay | Admitting: Family

## 2023-09-02 ENCOUNTER — Other Ambulatory Visit: Payer: Self-pay

## 2023-09-02 DIAGNOSIS — I10 Essential (primary) hypertension: Secondary | ICD-10-CM

## 2023-09-02 MED ORDER — VALSARTAN 80 MG PO TABS: 80.0000 mg | ORAL_TABLET | Freq: Every day | ORAL | 0 refills | Status: DC

## 2023-10-04 ENCOUNTER — Other Ambulatory Visit: Payer: Self-pay | Admitting: Family

## 2023-10-04 DIAGNOSIS — I10 Essential (primary) hypertension: Secondary | ICD-10-CM

## 2023-10-13 ENCOUNTER — Ambulatory Visit
Admission: EM | Admit: 2023-10-13 | Discharge: 2023-10-13 | Disposition: A | Discharge status: Home / Self Care | Attending: Physician Assistant | Admitting: Physician Assistant

## 2023-10-13 DIAGNOSIS — S46811A Strain of other muscles, fascia and tendons at shoulder and upper arm level, right arm, initial encounter: Secondary | ICD-10-CM | POA: Diagnosis not present

## 2023-10-13 MED ORDER — PREDNISONE 20 MG PO TABS: 40.0000 mg | ORAL_TABLET | Freq: Every day | ORAL | 0 refills | Status: AC

## 2023-10-13 MED ORDER — CYCLOBENZAPRINE HCL 10 MG PO TABS: 10.0000 mg | ORAL_TABLET | Freq: Two times a day (BID) | ORAL | 0 refills | Status: DC | PRN

## 2023-10-13 NOTE — ED Triage Notes (Signed)
"  This started 2-3 wks ago, I had the Flu, the pain is similar to a stiff neck but it is bad on the right side of my neck and extending down shoulder/arm and now today with right arm numbness and tingling when I first woke up". "The pain gets better with OTC treatment for a short time but back again". No dizziness. No light headedness. No chest pain. No visual changes or concerns.

## 2023-10-14 NOTE — ED Provider Notes (Signed)
 [facility]    CSN: [id number] Arrival date & time: 10/13/23  4098      History   Chief Complaint Chief Complaint  Patient presents with   Pain    HPI Janice Stewart is a 38 y.o. female.   Patient here today for evaluation of pain to the area of her right trapezius that has been ongoing for the last few weeks. She reports she woke this morning with some numbness and tingling in her right hand but this has resolved. Pain does shoot from her right lateral neck/ shoulder area at times into her right arm. She has not had any known injury. She denies any chest pain, shortness of breath, lightheadedness, dizziness or visual changes.   The history is provided by the patient.    Past Medical History:  Diagnosis Date   Anemia    Bone infarct (HCC)    S1 vertebrae   Diastasis recti 06/2023   Essential hypertension    Gestational choriocarcinoma (HCC) 2017   Headache    Left inguinal hernia 05/2023   Sickle cell trait (HCC)    Stroke (HCC) 2017   around the time of chemotherapy   Umbilical hernia 05/2023    Patient Active Problem List   Diagnosis Date Noted   Diastasis recti 07/25/2023   Umbilical hernia without obstruction and without gangrene 07/25/2023   Left inguinal hernia 06/20/2023   Low back pain 03/26/2022   Bone infarct (HCC) 02/22/2022   Simple obesity 09/22/2021   Sickle cell trait (HCC) 02/22/2020   Gestational choriocarcinoma (HCC) in 2017 01/25/2020   History of CVA (cerebrovascular accident) 01/25/2020    Past Surgical History:  Procedure Laterality Date   INSERTION OF MESH  07/25/2023   Procedure: INSERTION OF MESH;  Surgeon: Henrene Dodge, MD;  Location: [facility] ORS;  Service: General;;   LIVER BIOPSY  2017   LUNG BIOPSY  2017   PORT-A-CATH REMOVAL  2018   PORTA CATH INSERTION  2017   ROBOT ASSISTED INGUINAL HERNIA REPAIR Left 06/20/2023   AND Creation of Left Posterior Rectus-Transversalis Fascia Advancment Flap for Coverage of  Pelvic Wound (200 cm)    OB History     Gravida  4   Para  2   Term  2   Preterm      AB  1   Living  2      SAB  1   IAB      Ectopic      Multiple  0   Live Births  2            Home Medications    Prior to Admission medications   Medication Sig Start Date End Date Taking? Authorizing Provider  acetaminophen (TYLENOL) 500 MG tablet Take 2 tablets (1,000 mg total) by mouth every 6 (six) hours as needed for mild pain (pain score 1-3). 07/25/23  Yes Piscoya, Elita Quick, MD  BIOTIN PO Take 1 tablet by mouth daily.   Yes [provider]  Cholecalciferol (VITAMIN D3) 50 MCG (2000 UT) CHEW Chew 2,000 Units by mouth in the morning.   Yes [provider]  cyclobenzaprine (FLEXERIL) 10 MG tablet Take 1 tablet (10 mg total) by mouth 2 (two) times daily as needed for muscle spasms. 10/13/23  Yes Tomi Bamberger, PA-C  folic acid (FOLVITE) 1 MG tablet Take 1 mg by mouth daily.   Yes [provider]  ibuprofen (ADVIL) 600 MG tablet Take 1 tablet (600  mg total) by mouth every 8 (eight) hours as needed for moderate pain (pain score 4-6). 07/25/23  Yes Piscoya, Elita Quick, MD  minoxidil (LONITEN) 2.5 MG tablet Take 1 tablet (2.5 mg total) by mouth daily. 07/25/23  Yes Deirdre Evener, MD  Multiple Vitamin (MULTIVITAMIN WITH MINERALS) TABS tablet Take 1 tablet by mouth in the morning.   Yes [provider]  paragard intrauterine copper IUD IUD 1 each by Intrauterine route once.   Yes [provider]  predniSONE (DELTASONE) 20 MG tablet Take 2 tablets (40 mg total) by mouth daily with breakfast for 5 days. 10/13/23 10/18/23 Yes Tomi Bamberger, PA-C  valsartan (DIOVAN) 80 MG tablet Take 1 tablet by mouth once daily 09/02/23  Yes Zonia Kief, Amy J, NP  valsartan (DIOVAN) 80 MG tablet Take 1 tablet (80 mg total) by mouth daily. 09/02/23   Rema Fendt, NP    Family History Family History  Problem Relation Age of Onset   Varicose Veins Mother     Hypertension Father    Cancer Father        prostate    Social History Social History   Tobacco Use   Smoking status: Never    Passive exposure: Never   Smokeless tobacco: Never  Vaping Use   Vaping status: Never Used  Substance Use Topics   Alcohol use: Not Currently   Drug use: Never     Allergies   Patient has no known allergies.   Review of Systems Review of Systems  Constitutional:  Negative for chills and fever.  Eyes:  Negative for discharge and redness.  Respiratory:  Negative for shortness of breath.   Cardiovascular:  Negative for chest pain.  Gastrointestinal:  Negative for abdominal pain, nausea and vomiting.  Musculoskeletal:  Positive for myalgias.  Neurological:  Negative for light-headedness and numbness.     Physical Exam Triage Vital Signs ED Triage Vitals  Encounter Vitals Group     BP 10/13/23 0948 (!) 146/92     Systolic BP Percentile --      Diastolic BP Percentile --      Pulse Rate 10/13/23 0948 68     Resp 10/13/23 0948 18     Temp 10/13/23 0948 98.7 F (37.1 C)     Temp Source 10/13/23 0948 Oral     SpO2 10/13/23 0948 97 %     Weight 10/13/23 0945 198 lb 13.7 oz (90.2 kg)     Height 10/13/23 0945 5\' 9"  (1.753 m)     Head Circumference --      Peak Flow --      Pain Score 10/13/23 0943 8     Pain Loc --      Pain Education --      Exclude from Growth Chart --    No data found.  Updated Vital Signs BP (!) 146/92 (BP Location: Left Arm) Comment: "No medicine this morning yet"  Pulse 68   Temp 98.7 F (37.1 C) (Oral)   Resp 18   Ht 5\' 9"  (1.753 m)   Wt 198 lb 13.7 oz (90.2 kg)   SpO2 97%   BMI 29.37 kg/m   Visual Acuity Right Eye Distance:   Left Eye Distance:   Bilateral Distance:    Right Eye Near:   Left Eye Near:    Bilateral Near:     Physical Exam Vitals and nursing note reviewed.  Constitutional:      General: She is not in acute distress.  Appearance: Normal appearance. She is not ill-appearing.   HENT:     Head: Normocephalic and atraumatic.  Eyes:     Conjunctiva/sclera: Conjunctivae normal.  Neck:     Comments: No TTP to midline cervical spine Cardiovascular:     Rate and Rhythm: Normal rate.  Pulmonary:     Effort: Pulmonary effort is normal.  Musculoskeletal:     Cervical back: Normal range of motion. No tenderness.     Comments: Mild TTP across upper right trapezius muslce  Neurological:     Mental Status: She is alert.  Psychiatric:        Mood and Affect: Mood normal.        Behavior: Behavior normal.        Thought Content: Thought content normal.      UC Treatments / Results  Labs (all labs ordered are listed, but only abnormal results are displayed) Labs Reviewed - No data to display  EKG   Radiology No results found.  Procedures Procedures (including critical care time)  Medications Ordered in UC Medications - No data to display  Initial Impression / Assessment and Plan / UC Course  I have reviewed the triage vital signs and the nursing notes.  Pertinent labs & imaging results that were available during my care of the patient were reviewed by me and considered in my medical decision making (see chart for details).    Suspect muscle strain with possible radiculopathy this morning. Will treat with steroid burst and muscle relaxer and encouraged follow up if no gradual improvement or with any further concerns.   Final Clinical Impressions(s) / UC Diagnoses   Final diagnoses:  Strain of right trapezius muscle, initial encounter   Discharge Instructions   None    ED Prescriptions     Medication Sig Dispense Auth. Provider   predniSONE (DELTASONE) 20 MG tablet Take 2 tablets (40 mg total) by mouth daily with breakfast for 5 days. 10 tablet Erma Pinto F, PA-C   cyclobenzaprine (FLEXERIL) 10 MG tablet Take 1 tablet (10 mg total) by mouth 2 (two) times daily as needed for muscle spasms. 20 tablet Tomi Bamberger, PA-C      PDMP not  reviewed this encounter.   Tomi Bamberger, PA-C 10/14/23 [phone]

## 2023-11-06 ENCOUNTER — Other Ambulatory Visit: Payer: Self-pay | Admitting: Family

## 2023-11-06 ENCOUNTER — Other Ambulatory Visit: Payer: Self-pay

## 2023-11-06 DIAGNOSIS — I10 Essential (primary) hypertension: Secondary | ICD-10-CM

## 2023-11-06 MED ORDER — VALSARTAN 80 MG PO TABS: 80.0000 mg | ORAL_TABLET | Freq: Every day | ORAL | 0 refills | Status: DC

## 2024-01-29 ENCOUNTER — Encounter: Payer: Self-pay | Admitting: Neurology

## 2024-01-29 ENCOUNTER — Ambulatory Visit (INDEPENDENT_AMBULATORY_CARE_PROVIDER_SITE_OTHER): Admitting: Family

## 2024-01-29 ENCOUNTER — Encounter: Payer: Self-pay | Admitting: Family

## 2024-01-29 VITALS — BP 139/89 | HR 77 | Temp 98.2°F | Resp 16 | Ht 69.0 in | Wt 209.6 lb

## 2024-01-29 DIAGNOSIS — Z130 Encounter for screening for diseases of the blood and blood-forming organs and certain disorders involving the immune mechanism: Secondary | ICD-10-CM

## 2024-01-29 DIAGNOSIS — M62838 Other muscle spasm: Secondary | ICD-10-CM | POA: Diagnosis not present

## 2024-01-29 DIAGNOSIS — Z13220 Encounter for screening for lipoid disorders: Secondary | ICD-10-CM | POA: Diagnosis not present

## 2024-01-29 DIAGNOSIS — Z13228 Encounter for screening for other metabolic disorders: Secondary | ICD-10-CM

## 2024-01-29 DIAGNOSIS — G43909 Migraine, unspecified, not intractable, without status migrainosus: Secondary | ICD-10-CM | POA: Diagnosis not present

## 2024-01-29 DIAGNOSIS — Z131 Encounter for screening for diabetes mellitus: Secondary | ICD-10-CM | POA: Diagnosis not present

## 2024-01-29 DIAGNOSIS — J329 Chronic sinusitis, unspecified: Secondary | ICD-10-CM

## 2024-01-29 DIAGNOSIS — R5383 Other fatigue: Secondary | ICD-10-CM | POA: Diagnosis not present

## 2024-01-29 DIAGNOSIS — Z0000 Encounter for general adult medical examination without abnormal findings: Secondary | ICD-10-CM

## 2024-01-29 DIAGNOSIS — M542 Cervicalgia: Secondary | ICD-10-CM | POA: Diagnosis not present

## 2024-01-29 DIAGNOSIS — I10 Essential (primary) hypertension: Secondary | ICD-10-CM | POA: Diagnosis not present

## 2024-01-29 DIAGNOSIS — Z1329 Encounter for screening for other suspected endocrine disorder: Secondary | ICD-10-CM | POA: Diagnosis not present

## 2024-01-29 DIAGNOSIS — Z1321 Encounter for screening for nutritional disorder: Secondary | ICD-10-CM

## 2024-01-29 MED ORDER — AMOXICILLIN-POT CLAVULANATE 875-125 MG PO TABS
1.0000 | ORAL_TABLET | Freq: Two times a day (BID) | ORAL | 0 refills | Status: DC
Start: 2024-01-29 — End: 2024-06-08

## 2024-01-29 MED ORDER — CYCLOBENZAPRINE HCL 5 MG PO TABS: 5.0000 mg | ORAL_TABLET | Freq: Three times a day (TID) | ORAL | 0 refills | Status: DC | PRN

## 2024-01-29 MED ORDER — SUMATRIPTAN SUCCINATE 25 MG PO TABS: ORAL_TABLET | ORAL | 0 refills | Status: AC

## 2024-01-29 MED ORDER — VALSARTAN 80 MG PO TABS: 80.0000 mg | ORAL_TABLET | Freq: Every day | ORAL | 0 refills | Status: AC

## 2024-01-29 NOTE — Progress Notes (Signed)
 Having a lot of migraine lately.  Dealing with sinus pressure and a stiff neck. Patient does not know why however she is always in pain all over her body

## 2024-01-29 NOTE — Progress Notes (Signed)
 Patient ID: Janice Stewart, female    DOB: [birth year]  MRN: [id number]  CC: Annual Exam   Subjective: Janice Stewart is a 38 y.o. female who presents for annual exam.   Her concerns today include:  - States she hasn't taken Valsartan  in a while per her preference. She is ready to begin taking Valsartan  again consistently. She does not complain of red flag symptoms such as but not limited to chest pain, shortness of breath, worst headache of life, nausea/vomiting. - Migraines. States causing neck stiffness. Denies red flag symptoms. She would like referral to a specialist. - Sinus congestion. Denies red flag symptoms. - States body pain. Denies red flag symptoms.  Patient Active Problem List   Diagnosis Date Noted   Diastasis recti 07/25/2023   Umbilical hernia without obstruction and without gangrene 07/25/2023   Left inguinal hernia 06/20/2023   Low back pain 03/26/2022   Bone infarct (HCC) 02/22/2022   Simple obesity 09/22/2021   Sickle cell trait (HCC) 02/22/2020   Gestational choriocarcinoma (HCC) in 2017 01/25/2020   History of CVA (cerebrovascular accident) 01/25/2020     Current Outpatient Medications on File Prior to Visit  Medication Sig Dispense Refill   acetaminophen  (TYLENOL ) 500 MG tablet Take 2 tablets (1,000 mg total) by mouth every 6 (six) hours as needed for mild pain (pain score 1-3).     ibuprofen  (ADVIL ) 600 MG tablet Take 1 tablet (600 mg total) by mouth every 8 (eight) hours as needed for moderate pain (pain score 4-6). 60 tablet 1   paragard  intrauterine copper  IUD IUD 1 each by Intrauterine route once.     BIOTIN PO Take 1 tablet by mouth daily. (Patient not taking: Reported on 01/29/2024)     Cholecalciferol (VITAMIN D3) 50 MCG (2000 UT) CHEW Chew 2,000 Units by mouth in the morning. (Patient not taking: Reported on 01/29/2024)     cyclobenzaprine  (FLEXERIL ) 10 MG tablet Take 1 tablet (10 mg total) by mouth 2 (two) times daily as needed  for muscle spasms. (Patient not taking: Reported on 01/29/2024) 20 tablet 0   folic acid  (FOLVITE ) 1 MG tablet Take 1 mg by mouth daily. (Patient not taking: Reported on 01/29/2024)     minoxidil  (LONITEN ) 2.5 MG tablet Take 1 tablet (2.5 mg total) by mouth daily. (Patient not taking: Reported on 01/29/2024) 90 tablet 3   Multiple Vitamin (MULTIVITAMIN WITH MINERALS) TABS tablet Take 1 tablet by mouth in the morning. (Patient not taking: Reported on 01/29/2024)     valsartan  (DIOVAN ) 80 MG tablet Take 1 tablet by mouth once daily (Patient not taking: Reported on 01/29/2024) 30 tablet 0   No current facility-administered medications on file prior to visit.    No Known Allergies  Social History   Socioeconomic History   Marital status: Married    Spouse name: Materials engineer (Spouse)   Number of children: 2   Years of education: Not on file   Highest education level: Not on file  Occupational History   Not on file  Tobacco Use   Smoking status: Never    Passive exposure: Never   Smokeless tobacco: Never  Vaping Use   Vaping status: Never Used  Substance and Sexual Activity   Alcohol use: Not Currently   Drug use: Never   Sexual activity: Yes    Birth control/protection: I.U.D.  Other Topics Concern   Not on file  Social History Narrative   Not on file  Social Drivers of Corporate investment banker Strain: Low Risk  (01/29/2024)   Overall Financial Resource Strain (CARDIA)    Difficulty of Paying Living Expenses: Not hard at all  Food Insecurity: No Food Insecurity (01/29/2024)   Hunger Vital Sign    Worried About Running Out of Food in the Last Year: Never true    Ran Out of Food in the Last Year: Never true  Transportation Needs: No Transportation Needs (01/29/2024)   PRAPARE - Administrator, Civil Service (Medical): No    Lack of Transportation (Non-Medical): No  Physical Activity: Insufficiently Active (01/29/2024)   Exercise Vital Sign    Days of Exercise per  Week: 3 days    Minutes of Exercise per Session: 30 min  Stress: No Stress Concern Present (01/29/2024)   [employer] of Occupational Health - Occupational Stress Questionnaire    Feeling of Stress: Not at all  Social Connections: Moderately Integrated (01/29/2024)   Social Connection and Isolation Panel    Frequency of Communication with Friends and Family: More than three times a week    Frequency of Social Gatherings with Friends and Family: More than three times a week    Attends Religious Services: More than 4 times per year    Active Member of Clubs or Organizations: No    Attends Banker Meetings: Never    Marital Status: Married  Catering manager Violence: Not At Risk (01/29/2024)   Humiliation, Afraid, Rape, and Kick questionnaire    Fear of Current or Ex-Partner: No    Emotionally Abused: No    Physically Abused: No    Sexually Abused: No    Family History  Problem Relation Age of Onset   Varicose Veins Mother    Hypertension Father    Cancer Father        prostate    Past Surgical History:  Procedure Laterality Date   INSERTION OF MESH  07/25/2023   Procedure: INSERTION OF MESH;  Surgeon: Emmalene Hare, MD;  Location: [facility] ORS;  Service: General;;   LIVER BIOPSY  2017   LUNG BIOPSY  2017   PORT-A-CATH REMOVAL  2018   PORTA CATH INSERTION  2017   ROBOT ASSISTED INGUINAL HERNIA REPAIR Left 06/20/2023   AND Creation of Left Posterior Rectus-Transversalis Fascia Advancment Flap for Coverage of Pelvic Wound (200 cm)    ROS: Review of Systems Negative except as stated above  PHYSICAL EXAM: BP 139/89   Pulse 77   Temp 98.2 F (36.8 C) (Oral)   Resp 16   Ht 5' 9 (1.753 m)   Wt 209 lb 9.6 oz (95.1 kg)   LMP 01/12/2024 (Exact Date)   SpO2 96%   BMI 30.95 kg/m   Physical Exam HENT:     Head: Normocephalic and atraumatic.     Right Ear: Tympanic membrane, ear canal and external ear normal.     Left Ear: Tympanic membrane, ear canal and  external ear normal.     Nose: Nose normal.     Mouth/Throat:     Mouth: Mucous membranes are moist.     Pharynx: Oropharynx is clear.   Eyes:     Extraocular Movements: Extraocular movements intact.     Conjunctiva/sclera: Conjunctivae normal.     Pupils: Pupils are equal, round, and reactive to light.   Neck:     Thyroid : No thyroid  mass, thyromegaly or thyroid  tenderness.   Cardiovascular:     Rate and Rhythm: Normal rate  and regular rhythm.     Pulses: Normal pulses.     Heart sounds: Normal heart sounds.  Pulmonary:     Effort: Pulmonary effort is normal.     Breath sounds: Normal breath sounds.  Chest:     Comments: Patient declined. Abdominal:     General: Bowel sounds are normal.     Palpations: Abdomen is soft.  Genitourinary:    Comments: Patient declined.  Musculoskeletal:        General: Normal range of motion.     Right shoulder: Normal.     Left shoulder: Normal.     Right upper arm: Normal.     Left upper arm: Normal.     Right elbow: Normal.     Left elbow: Normal.     Right forearm: Normal.     Left forearm: Normal.     Right wrist: Normal.     Left wrist: Normal.     Right hand: Normal.     Left hand: Normal.     Cervical back: Normal, normal range of motion and neck supple.     Thoracic back: Normal.     Lumbar back: Normal.     Right hip: Normal.     Left hip: Normal.     Right upper leg: Normal.     Left upper leg: Normal.     Right knee: Normal.     Left knee: Normal.     Right lower leg: Normal.     Left lower leg: Normal.     Right ankle: Normal.     Left ankle: Normal.     Right foot: Normal.     Left foot: Normal.   Skin:    General: Skin is warm and dry.     Capillary Refill: Capillary refill takes less than 2 seconds.   Neurological:     General: No focal deficit present.     Mental Status: She is alert and oriented to person, place, and time.   Psychiatric:        Mood and Affect: Mood normal.        Behavior: Behavior  normal.   ASSESSMENT AND PLAN: 1. Annual physical exam (Primary) - Counseled on 150 minutes of exercise per week as tolerated, healthy eating (including decreased daily intake of saturated fats, cholesterol, added sugars, sodium), STI prevention, and routine healthcare maintenance.  2. Screening for metabolic disorder - Routine screening.  - CMP14+EGFR  3. Screening for deficiency anemia - Routine screening.  - CBC  4. Diabetes mellitus screening - Routine screening.  - Hemoglobin A1c  5. Screening cholesterol level - Routine screening.  - Lipid panel  6. Thyroid  disorder screen - Routine screening.  - TSH  7. Encounter for vitamin deficiency screening - Routine screening.  - Vitamin D , 25-hydroxy  8. Primary hypertension - Continue Valsartan  as prescribed.  - Counseled on blood pressure goal of less than 130/80, low-sodium, DASH diet, medication compliance, and 150 minutes of moderate intensity exercise per week as tolerated. Counseled on medication adherence and adverse effects. - Follow-up with primary provider in 4 weeks or sooner if needed.  - valsartan  (DIOVAN ) 80 MG tablet; Take 1 tablet (80 mg total) by mouth daily.  Dispense: 90 tablet; Refill: 0  9. Migraine without status migrainosus, not intractable, unspecified migraine type 10. Neck pain 11. Muscle spasm - Sumatriptan and Cyclobenzaprine  as prescribed. Counseled on medication adherence/adverse effects.  - Referral to Neurology for evaluation/management.  - Follow-up with primary  provider as scheduled. - Ambulatory referral to Neurology - SUMAtriptan (IMITREX) 25 MG tablet; Take 25 mg (1 tablet total) by mouth at the start of the headache. May repeat in 2 hours x 1 if headache persists. Max of 2 tablets/24 hours.  Dispense: 90 tablet; Refill: 0 - cyclobenzaprine  (FLEXERIL ) 5 MG tablet; Take 1 tablet (5 mg total) by mouth 3 (three) times daily as needed for muscle spasms.  Dispense: 90 tablet; Refill: 0  12.  Sinusitis, unspecified chronicity, unspecified location - Amoxicillin-Clavulanate as prescribed. Counseled on medication adherence/adverse effects.  - Follow-up with primary provider as scheduled. - amoxicillin-clavulanate (AUGMENTIN) 875-125 MG tablet; Take 1 tablet by mouth 2 (two) times daily.  Dispense: 20 tablet; Refill: 0  13. Fatigue, unspecified type - Routine screening.  - Rheumatoid factor - Lupus Anticoagulant Panel - Sedimentation Rate   Patient was given the opportunity to ask questions.  Patient verbalized understanding of the plan and was able to repeat key elements of the plan. Patient was given clear instructions to go to Emergency Department or return to medical center if symptoms don't improve, worsen, or new problems develop.The patient verbalized understanding.   Orders Placed This Encounter  Procedures   CBC   Lipid panel   CMP14+EGFR   Hemoglobin A1c   TSH   Vitamin D , 25-hydroxy   Rheumatoid factor   Lupus Anticoagulant Panel   Sedimentation Rate   Ambulatory referral to Neurology     Requested Prescriptions   Signed Prescriptions Disp Refills   SUMAtriptan (IMITREX) 25 MG tablet 90 tablet 0    Sig: Take 25 mg (1 tablet total) by mouth at the start of the headache. May repeat in 2 hours x 1 if headache persists. Max of 2 tablets/24 hours.   cyclobenzaprine  (FLEXERIL ) 5 MG tablet 90 tablet 0    Sig: Take 1 tablet (5 mg total) by mouth 3 (three) times daily as needed for muscle spasms.   amoxicillin-clavulanate (AUGMENTIN) 875-125 MG tablet 20 tablet 0    Sig: Take 1 tablet by mouth 2 (two) times daily.   valsartan  (DIOVAN ) 80 MG tablet 90 tablet 0    Sig: Take 1 tablet (80 mg total) by mouth daily.    Return in about 1 year (around 01/28/2025) for Physical per patient preference.  Senaida Dama, NP

## 2024-01-30 DIAGNOSIS — R5383 Other fatigue: Secondary | ICD-10-CM | POA: Diagnosis not present

## 2024-01-30 DIAGNOSIS — Z1329 Encounter for screening for other suspected endocrine disorder: Secondary | ICD-10-CM | POA: Diagnosis not present

## 2024-01-30 DIAGNOSIS — Z130 Encounter for screening for diseases of the blood and blood-forming organs and certain disorders involving the immune mechanism: Secondary | ICD-10-CM | POA: Diagnosis not present

## 2024-01-30 DIAGNOSIS — Z13228 Encounter for screening for other metabolic disorders: Secondary | ICD-10-CM | POA: Diagnosis not present

## 2024-01-30 DIAGNOSIS — Z13220 Encounter for screening for lipoid disorders: Secondary | ICD-10-CM | POA: Diagnosis not present

## 2024-01-30 DIAGNOSIS — Z131 Encounter for screening for diabetes mellitus: Secondary | ICD-10-CM | POA: Diagnosis not present

## 2024-01-30 DIAGNOSIS — Z1321 Encounter for screening for nutritional disorder: Secondary | ICD-10-CM | POA: Diagnosis not present

## 2024-02-12 LAB — CBC
Hematocrit: 36.5 % (ref 34.0–46.6)
Hemoglobin: 11.9 g/dL (ref 11.1–15.9)
MCH: 28.9 pg (ref 26.6–33.0)
MCHC: 32.6 g/dL (ref 31.5–35.7)
MCV: 89 fL (ref 79–97)
Platelets: 253 10*3/uL (ref 150–450)
RBC: 4.12 x10E6/uL (ref 3.77–5.28)
RDW: 13.1 % (ref 11.7–15.4)
WBC: 6.3 10*3/uL (ref 3.4–10.8)

## 2024-02-12 LAB — VITAMIN D 25 HYDROXY (VIT D DEFICIENCY, FRACTURES): Vit D, 25-Hydroxy: 32 ng/mL (ref 30.0–100.0)

## 2024-02-12 LAB — LUPUS ANTICOAGULANT PANEL
APTT: 26 s
Anticardiolipin Ab, IgG: <10 [GPL'U]
Anticardiolipin Ab, IgM: <10 [MPL'U]
Beta-2 Glycoprotein I, IgA: <10 SAU
Beta-2 Glycoprotein I, IgG: <10 SGU
Beta-2 Glycoprotein I, IgM: <10 SMU
DRVVT Screen Seconds: 30.5 s
Hexagonal Phospholipid Neutral: 4 s
INR: 1 ratio
Platelet Neutralization: 0.7 s
Prothrombin Time: 10.7 s
Thrombin Time: 17.4 s

## 2024-02-12 LAB — LIPID PANEL
Chol/HDL Ratio: 3.2 ratio (ref 0.0–4.4)
Cholesterol, Total: 220 mg/dL — ABNORMAL HIGH (ref 100–199)
HDL: 68 mg/dL (ref >39)
LDL Chol Calc (NIH): 129 mg/dL — ABNORMAL HIGH (ref 0–99)
Triglycerides: 130 mg/dL (ref 0–149)
VLDL Cholesterol Cal: 23 mg/dL (ref 5–40)

## 2024-02-12 LAB — CMP14+EGFR
ALT: 11 IU/L (ref 0–32)
AST: 14 IU/L (ref 0–40)
Albumin: 4.7 g/dL (ref 3.9–4.9)
Alkaline Phosphatase: 66 IU/L (ref 44–121)
BUN/Creatinine Ratio: 13 (ref 9–23)
BUN: 13 mg/dL (ref 6–20)
Bilirubin Total: 0.2 mg/dL (ref 0.0–1.2)
CO2: 20 mmol/L (ref 20–29)
Calcium: 9.9 mg/dL (ref 8.7–10.2)
Chloride: 105 mmol/L (ref 96–106)
Creatinine, Ser: 0.98 mg/dL (ref 0.57–1.00)
Globulin, Total: 2.8 g/dL (ref 1.5–4.5)
Glucose: 89 mg/dL (ref 70–99)
Potassium: 4.5 mmol/L (ref 3.5–5.2)
Sodium: 139 mmol/L (ref 134–144)
Total Protein: 7.5 g/dL (ref 6.0–8.5)
eGFR: 76 mL/min/{1.73_m2} (ref >59)

## 2024-02-12 LAB — HEMOGLOBIN A1C
Est. average glucose Bld gHb Est-mCnc: 97 mg/dL
Hgb A1c MFr Bld: 5 % (ref 4.8–5.6)

## 2024-02-12 LAB — TSH: TSH: 1.61 u[IU]/mL (ref 0.450–4.500)

## 2024-02-12 LAB — SEDIMENTATION RATE: Sed Rate: 23 mm/h (ref 0–32)

## 2024-02-12 LAB — RHEUMATOID FACTOR: Rheumatoid fact SerPl-aCnc: <10 [IU]/mL (ref <14.0)

## 2024-02-13 ENCOUNTER — Ambulatory Visit: Payer: Self-pay | Admitting: Family

## 2024-02-13 DIAGNOSIS — Z13220 Encounter for screening for lipoid disorders: Secondary | ICD-10-CM

## 2024-05-27 ENCOUNTER — Telehealth: Payer: Self-pay | Admitting: *Deleted

## 2024-05-27 DIAGNOSIS — Z7189 Other specified counseling: Secondary | ICD-10-CM

## 2024-05-27 NOTE — Progress Notes (Signed)
 Complex Care Management Note Care Guide Note  05/27/2024 Name: Korine Kissel MRN: [id number] DOB: Jan 10, 1986   Complex Care Management Outreach Attempts: An unsuccessful telephone outreach was attempted today to offer the patient information about available complex care management services.  Follow Up Plan:  Additional outreach attempts will be made to offer the patient complex care management information and services.   Encounter Outcome:  No Answer  Thedford Franks, CMA [place]  [facility], [facility] Guide Direct Dial: [phone]  Fax: [phone] Website: [email or web address]

## 2024-05-29 NOTE — Progress Notes (Signed)
 Complex Care Management Note Care Guide Note  05/29/2024 Name: Janice Stewart MRN: [id number] DOB: [birth year]   Complex Care Management Outreach Attempts: A second unsuccessful outreach was attempted today to offer the patient with information about available complex care management services.  Follow Up Plan:  Additional outreach attempts will be made to offer the patient complex care management information and services.   Encounter Outcome:  No Answer  Thedford Franks, CMA [place]  [facility], [facility] Guide Direct Dial: [phone]  Fax: [phone] Website: [email or web address]

## 2024-06-04 NOTE — Progress Notes (Signed)
 Complex Care Management Note Care Guide Note  06/04/2024 Name: Janice Stewart MRN: [id number] DOB: 1985-12-29   Complex Care Management Outreach Attempts: A third unsuccessful outreach was attempted today to offer the patient with information about available complex care management services.  Follow Up Plan:  No further outreach attempts will be made at this time. We have been unable to contact the patient to offer or enroll patient in complex care management services.  Encounter Outcome:  No Answer  Thedford Franks, CMA [place]  [facility], [facility] Guide Direct Dial: [phone]  Fax: [phone] Website: [email or web address]

## 2024-06-08 ENCOUNTER — Ambulatory Visit: Admitting: Surgery

## 2024-06-08 ENCOUNTER — Encounter: Payer: Self-pay | Admitting: Surgery

## 2024-06-08 VITALS — BP 153/118 | HR 60 | Temp 98.1°F | Ht 69.0 in | Wt 210.2 lb

## 2024-06-08 DIAGNOSIS — Z09 Encounter for follow-up examination after completed treatment for conditions other than malignant neoplasm: Secondary | ICD-10-CM

## 2024-06-08 DIAGNOSIS — Z8719 Personal history of other diseases of the digestive system: Secondary | ICD-10-CM

## 2024-06-08 DIAGNOSIS — K429 Umbilical hernia without obstruction or gangrene: Secondary | ICD-10-CM | POA: Diagnosis not present

## 2024-06-08 NOTE — Patient Instructions (Signed)
 Umbilical Hernia, Adult  A hernia is a lump of tissue that pushes through an opening in the muscles. An umbilical hernia happens in the belly, near the belly button. The hernia may contain tissues from the small or large intestine. It may also have fatty tissue that covers the intestines. Umbilical hernias in adults may get worse over time. They need to be treated with surgery. There are several types of umbilical hernias. They include: Indirect hernia. This occurs just above or below the belly button. It's the most common type of umbilical hernia in adults. Direct hernia. This type occurs in an opening that's formed by the belly button. Reducible hernia. This hernia comes and goes. You may see it only when you strain, cough, or lift something heavy. This type of hernia can be pushed back into the belly (reduced). Incarcerated hernia. This traps the hernia in the wall of the belly. This type of hernia can't be pushed back into the belly. It can cause a strangulated hernia. Strangulated hernia. This hernia cuts off blood flow to the tissues inside the hernia. The tissues can die if this happens. This type of hernia must be treated right away. What are the causes? An umbilical hernia happens when tissue inside the belly pushes through an opening in the muscles of the belly. What increases the risk? You're more likely to get this hernia if: You strain while lifting or pushing heavy objects. You've had several pregnancies. You have a condition that puts pressure on your belly, and you've had it for a long time. These include: Obesity. A buildup of fluid inside your belly. Vomiting or coughing all the time. Trouble pooping (constipation). You've had surgery that weakened the muscles in the belly. What are the signs or symptoms? The main symptom of this condition is a bulge at the belly button or near it. The bulge does not cause pain. Other symptoms depend on the type of hernia you have. A  reducible hernia may be seen only when you strain, cough, or lift something heavy. Other symptoms may include: Dull pain. A feeling of pressure. An incarcerated hernia may cause very bad pain. Also, you may: Vomit or feel like you may vomit. Not be able to pass gas. A strangulated hernia may cause: Pain that gets worse and worse. Vomiting, or feeling like you may vomit. Pain when you press on the hernia. Change of color on the skin over the hernia. The skin may become red or purple. Trouble pooping. Blood in the poop. How is this diagnosed? This condition may be diagnosed based on: Your symptoms and medical history. A physical exam. You may be asked to cough or strain while standing. These actions will put pressure inside your belly. The pressure can force the hernia through the opening in your muscles. Your health care provider may try to push the hernia back into your belly (reduce). How is this treated? Surgery is the only treatment for an umbilical hernia. Surgery for a strangulated hernia must be done right away. If you have a small hernia that's not incarcerated, you may need to lose weight before the surgery is done. Follow these instructions at home: Managing constipation You may need to take these actions to prevent trouble pooping. This will help to prevent straining. Drink enough fluid to keep your pee (urine) pale yellow. Take over-the-counter or prescription medicines. Eat foods that are high in fiber, such as beans, whole grains, and fresh fruits and vegetables. Limit foods that are high in  fat and sugars, such as fried or sweet foods. General instructions Do not try to push the hernia back in. Lose weight, if told by your provider. Watch your hernia for any changes in color or size. Tell your provider if any changes occur. You may need to avoid activities that put pressure on your hernia. You may have to avoid lifting. Ask your provider how much you can safely  lift. Take over-the-counter and prescription medicines only as told by your provider. Contact a health care provider if: Your hernia gets larger or feels hard. Your hernia becomes painful. You get a fever or chills. Get help right away if: You get very bad pain near the area of the hernia, and the pain comes on suddenly. You have pain and you vomit or feel like you may vomit. The skin over your hernia changes color. These symptoms may be an emergency. Get help right away. Call 911. Do not wait to see if the symptoms go away. Do not drive yourself to the hospital. This information is not intended to replace advice given to you by your health care provider. Make sure you discuss any questions you have with your health care provider. Document Revised: 11/20/2022 Document Reviewed: 11/20/2022 Elsevier Patient Education  2024 [employer].

## 2024-06-08 NOTE — Progress Notes (Signed)
 06/08/2024  History of Present Illness: Janice Stewart is a 38 y.o. female status post robotic umbilical hernia repair with plication of diastases recti on 07/25/2023.  The patient's umbilical hernia was 3.2 cm in size but with a plication of diastases, we used the mesh that was 10 x 20 cm in size.  The patient reports that about a week ago he noticed an area of a bump over her mid to right portion of the abdomen.  She denies any pain or any worsening sensation in the area but given her history, she was concerned and wanted to be evaluated.  Has not had any issues with the incisions from her hernia repair and has not felt any specific bulging sensation.  Past Medical History: Past Medical History:  Diagnosis Date   Anemia    Bone infarct (HCC)    S1 vertebrae   Diastasis recti 06/2023   Essential hypertension    Gestational choriocarcinoma (HCC) 2017   Headache    Left inguinal hernia 05/2023   Sickle cell trait    Stroke (HCC) 2017   around the time of chemotherapy   Umbilical hernia 05/2023     Past Surgical History: Past Surgical History:  Procedure Laterality Date   INSERTION OF MESH  07/25/2023   Procedure: INSERTION OF MESH;  Surgeon: Desiderio Schanz, MD;  Location: [facility] ORS;  Service: General;;   LIVER BIOPSY  2017   LUNG BIOPSY  2017   PORT-A-CATH REMOVAL  2018   PORTA CATH INSERTION  2017   ROBOT ASSISTED INGUINAL HERNIA REPAIR Left 06/20/2023   AND Creation of Left Posterior Rectus-Transversalis Fascia Advancment Flap for Coverage of Pelvic Wound (200 cm)    Home Medications: Prior to Admission medications   Medication Sig Start Date End Date Taking? Authorizing Provider  acetaminophen  (TYLENOL ) 500 MG tablet Take 2 tablets (1,000 mg total) by mouth every 6 (six) hours as needed for mild pain (pain score 1-3). 07/25/23  Yes Masie Bermingham, Schanz, MD  Multiple Vitamin (MULTIVITAMIN WITH MINERALS) TABS tablet Take 1 tablet by mouth in the morning.   Yes [provider]  paragard  intrauterine copper  IUD IUD 1 each by Intrauterine route once.   Yes [provider]  SUMAtriptan  (IMITREX ) 25 MG tablet Take 25 mg (1 tablet total) by mouth at the start of the headache. May repeat in 2 hours x 1 if headache persists. Max of 2 tablets/24 hours. 01/29/24  Yes Lorren, Amy J, NP  valsartan  (DIOVAN ) 80 MG tablet Take 1 tablet (80 mg total) by mouth daily. 01/29/24  Yes Lorren Greig PARAS, NP    Allergies: No Known Allergies  Review of Systems: Review of Systems  Constitutional:  Negative for chills and fever.  Respiratory:  Negative for shortness of breath.   Cardiovascular:  Negative for chest pain.  Gastrointestinal:  Negative for abdominal pain, nausea and vomiting.  Skin:        Bump at skin in the mid abdomen.    Physical Exam BP (!) 153/118   Pulse 60   Temp 98.1 F (36.7 C) (Oral)   Ht 5' 9 (1.753 m)   Wt 210 lb 3.2 oz (95.3 kg)   SpO2 100%   BMI 31.04 kg/m  CONSTITUTIONAL: No acute distress HEENT:  Normocephalic, atraumatic, extraocular motion intact. RESPIRATORY:  Normal respiratory effort without pathologic use of accessory muscles. GI: The abdomen is soft, nondistended, nontender to palpation.  The patient's hernia repair and plication are intact and there  is no evidence of hernia recurrence.  I can feel the bunched up tissue that has scarred at the midline where the diastases was located but there is no bulging or tenting of that area when she strains.  When she does try to sit up I can see fatty tissue that bumps but there is no hernia in this area.  NEUROLOGIC:  Motor and sensation is grossly normal.  Cranial nerves are grossly intact. PSYCH:  Alert and oriented to person, place and time. Affect is normal.   Assessment and Plan: This is a 38 y.o. female status post umbilical hernia repair and plication of diastases recti.  - Discussed with patient that on exam there is no evidence of any hernia recurrence or  recurrence of the diastases.  The plication remains intact and I can still feel the scarring that has been made as a result of that.  The umbilical hernia component remains intact and there is no hernia recurrence.  When she does start sitting up, I can see part of the pannus of the abdominal wall that protrudes a little bit more easily.  Likely this is what she is seeing.  There is no hernia in that area either.  Discussed with patient that there is no contraindication to physical activities in order to promote weight loss but otherwise for now no surgical interventions are needed. - Follow-up as needed, return precautions given.  I spent 10 minutes dedicated to the care of this patient on the date of this encounter to include pre-visit review of records, face-to-face time with the patient discussing diagnosis and management, and any post-visit coordination of care.   Aloysius Sheree Plant, MD [facility]

## 2024-06-10 ENCOUNTER — Ambulatory Visit: Admitting: Neurology

## 2024-06-25 ENCOUNTER — Encounter: Payer: Self-pay | Admitting: Family

## 2024-06-26 ENCOUNTER — Other Ambulatory Visit: Payer: Self-pay | Admitting: Family

## 2024-06-26 DIAGNOSIS — M549 Dorsalgia, unspecified: Secondary | ICD-10-CM

## 2024-06-26 DIAGNOSIS — M419 Scoliosis, unspecified: Secondary | ICD-10-CM

## 2024-06-26 NOTE — Telephone Encounter (Signed)
 Complete

## 2024-07-01 ENCOUNTER — Encounter: Payer: Self-pay | Admitting: Neurology

## 2024-07-01 ENCOUNTER — Ambulatory Visit: Admitting: Neurology

## 2024-07-01 VITALS — BP 136/86 | HR 81 | Ht 69.0 in | Wt 216.4 lb

## 2024-07-01 DIAGNOSIS — G43119 Migraine with aura, intractable, without status migrainosus: Secondary | ICD-10-CM

## 2024-07-01 DIAGNOSIS — Z8673 Personal history of transient ischemic attack (TIA), and cerebral infarction without residual deficits: Secondary | ICD-10-CM | POA: Diagnosis not present

## 2024-07-01 DIAGNOSIS — M542 Cervicalgia: Secondary | ICD-10-CM | POA: Diagnosis not present

## 2024-07-01 MED ORDER — TOPIRAMATE 25 MG PO TABS: 25.0000 mg | ORAL_TABLET | Freq: Every day | ORAL | 5 refills | Status: AC

## 2024-07-01 NOTE — Progress Notes (Signed)
 NEUROLOGY CONSULTATION NOTE  Janice Stewart MRN: [id number] DOB: 26-Nov-1985  Referring provider: Greig Chute, NP Primary care provider: Greig Chute, NP  Reason for consult:  headache  Assessment/Plan:   Migraine with aura, without status migrainosus, not intractable Cervicalgia History of stroke, unclear etiology, possibly hypercoagulable state in setting of lung cancer. - it is unclear why she was never directed to take a daily ASA.  Due to the bleeding in the lung, ASA may not have been started at time of hospital discharge, but she should be on daily antiplatelet therapy for secondary stroke prevention.  Due to worsening headaches and history of stroke, will check MRI of brain. Due to chronic neck pain that has contributed to frequent migraines, and having failed therapy (home neck stretches and medication), will also check MRI of cervical spine without contrast Migraine prevention:  start topiramate 25mg  at bedtime.  We can increase to 50mg  at bedtime in 4 weeks if needed. Migraine rescue:  She will try samples of Ubrelvy 100mg .  Due to history of stroke, triptans are contraindicated. Lifestyle modification: Limit use of pain relievers to no more than 9 days out of the month to prevent risk of rebound or medication-overuse headache. Diet modification/hydration/caffeine cessation Routine exercise Sleep hygiene Consider vitamins/supplements:  magnesium citrate 400mg  daily, riboflavin 400mg  daily, CoQ10 100mg  three times daily Keep headache diary Regarding secondary stroke management: Pending MRI results, would start ASA 81mg  daily Management by PCP of stroke risk factors: Normotensive blood pressure LDL goal less than 70 Hgb J8r goal less than 7 Follow up 7 months.  Total time spent on today's visit was 74 minutes dedicated to this patient today, preparing to see patient, examining the patient, ordering tests and/or medications and counseling the patient, documenting  clinical information in the EHR or other health record, independently interpreting results and communicating results to the patient/family, discussing treatment and goals, answering patient's questions and coordinating care.    Subjective:  Janice Stewart is a 38 year old right-handed female with HTN an sickle cell trait who presents for headache.  History supplemented by referring provider's note.   Discussed the use of AI scribe software for clinical note transcription with the patient, who gave verbal consent to proceed.  History of Present Illness Janice Stewart is a 38 year old female with a history of stroke who presents with chronic headaches.  Migraines: She has experienced headaches since she was in her early 7s, but she reports experiencing headaches weekly for the past year, which have increased in frequency from twice a month. The pain can be unilateral or diffuse, affecting the back of the head and face, as described by the patient. It is described as pressure when involving the whole head and throbbing when affecting the eye, sometimes accompanied by 'little stars' in her vision.  The headaches are severe, rated as an 'eleven' on a scale of one to ten, and can last up to a week, though they average two days. She experiences bilateral neck and shoulder pain, some nausea but no sensitivity to light and sound, although she is generally sensitive to these stimuli.  The headaches are triggered by stress, anger, and activities such as watching TV or reading, regardless of glasses use. She uses Tylenol , Motrin , Advil , a frozen mask, menthol  oil, chamomile tea, and coffee for relief.  She has longstanding history of neck pain which has contributed to her headaches.  She has failed therapy including home  neck stretches as well as multiple medications (muscle relaxants, NSAIDs and gabapentin )   She drinks two cups of coffee daily, increasing to four or five during headaches.   She drinks a significant amount of water daily, does not consume alcohol, and does not smoke. Her diet includes toast, avocado, eggs, fish, chicken, shrimp, pasta, and occasionally pork and bacon. She does not eat red meat. She sleeps more than eight hours a night and does not currently exercise.  Her family history includes migraines in her mother. She denies any history of concussions or head trauma.   Past NSAIDS/analgesics:  meloxicam , tramadol  Past abortive triptans:  none Past abortive ergotamine:  none Past muscle relaxants:  cyclobenzaprine , methocarbamol , baclofen  Past anti-emetic:  none Past antihypertensive medications:  amlodipine  Past antidepressant medications:  none Past anticonvulsant medications:  gabapentin  Past anti-CGRP:  none Past vitamins/Herbal/Supplements:  none Past antihistamines/decongestants:  none Other past therapies:  none  Current NSAIDS/analgesics:  acetaminophen , ibuprofen  Current triptans:  sumatriptan  25mg  (elevated blood pressure) Current ergotamine:  none Current anti-emetic:  none Current muscle relaxants:  none Current Antihypertensive medications:  valsartan  80mg  daily Current Antidepressant medications:  none Current Anticonvulsant medications:  none Current anti-CGRP:  none Current Vitamins/Herbal/Supplements:  MVI Current Antihistamines/Decongestants:  none Other therapy:  none Birth control:  none  History of stroke: She has a history of a stroke in 2017,during which she experienced numbness in her right arm, slurred speech, and difficulty staying awake. At the time, she was being treated for lung cancer.  She received TPA treatment, which led to lung bleeding. She reportedly had a limited brain MRI at the time due to artifact from braces.  Post-stroke, she notices a slight facial droop on video recordings when she speaks.  She was never started on ASA.  She has HTN, for which she takes valsartan .  Lipid panel in June 2025 revealed t chol  220, TG 130, HDL 68 and LDL 129. Hgb A1c was 5.      PAST MEDICAL HISTORY: Past Medical History:  Diagnosis Date   Anemia    Bone infarct (HCC)    S1 vertebrae   Diastasis recti 06/2023   Essential hypertension    Gestational choriocarcinoma (HCC) 2017   Headache    Left inguinal hernia 05/2023   Sickle cell trait    Stroke (HCC) 2017   around the time of chemotherapy   Umbilical hernia 05/2023    PAST SURGICAL HISTORY: Past Surgical History:  Procedure Laterality Date   INSERTION OF MESH  07/25/2023   Procedure: INSERTION OF MESH;  Surgeon: Desiderio Schanz, MD;  Location: [facility] ORS;  Service: General;;   LIVER BIOPSY  2017   LUNG BIOPSY  2017   PORT-A-CATH REMOVAL  2018   PORTA CATH INSERTION  2017   ROBOT ASSISTED INGUINAL HERNIA REPAIR Left 06/20/2023   AND Creation of Left Posterior Rectus-Transversalis Fascia Advancment Flap for Coverage of Pelvic Wound (200 cm)    MEDICATIONS: Current Outpatient Medications on File Prior to Visit  Medication Sig Dispense Refill   acetaminophen  (TYLENOL ) 500 MG tablet Take 2 tablets (1,000 mg total) by mouth every 6 (six) hours as needed for mild pain (pain score 1-3).     Multiple Vitamin (MULTIVITAMIN WITH MINERALS) TABS tablet Take 1 tablet by mouth in the morning.     paragard  intrauterine copper  IUD IUD 1 each by Intrauterine route once.     SUMAtriptan  (IMITREX ) 25 MG tablet Take 25 mg (1 tablet total) by mouth at the  start of the headache. May repeat in 2 hours x 1 if headache persists. Max of 2 tablets/24 hours. 90 tablet 0   valsartan  (DIOVAN ) 80 MG tablet Take 1 tablet (80 mg total) by mouth daily. 90 tablet 0   No current facility-administered medications on file prior to visit.    ALLERGIES: No Known Allergies  FAMILY HISTORY: Family History  Problem Relation Age of Onset   Varicose Veins Mother    Hypertension Father    Cancer Father        prostate    Objective:  Blood pressure 136/86, pulse 81, height 5'  9 (1.753 m), weight 216 lb 6.4 oz (98.2 kg), SpO2 98%. General: No acute distress.  Patient appears well-groomed.   Head:  Normocephalic/atraumatic Eyes:  fundi examined but not visualized Neck: supple, bilateral paraspinal tenderness, full range of motion Heart: regular rate and rhythm Neurological Exam: Mental status: alert and oriented to person, place, and time, speech fluent and not dysarthric, language intact. Cranial nerves: CN I: not tested CN II: pupils equal, round and reactive to light, visual fields intact CN III, IV, VI:  full range of motion, no nystagmus, no ptosis CN V: facial sensation intact. CN VII: upper and lower face symmetric CN VIII: hearing intact CN IX, X: gag intact, uvula midline CN XI: sternocleidomastoid and trapezius muscles intact CN XII: tongue midline Bulk & Tone: normal, no fasciculations. Motor:  muscle strength 5/5 throughout Sensation:  Pinprick and vibratory sensation intact. Deep Tendon Reflexes:  2+ throughout,  toes downgoing.   Finger to nose testing:  Without dysmetria.   Gait:  Normal station and stride.  Romberg negative.    Thank you for allowing me to take part in the care of this patient.  Juliene Dunnings, DO  CC: Greig Chute, NP

## 2024-07-01 NOTE — Patient Instructions (Addendum)
  Check MRI of brain and cervical spine without contrast. We have sent a referral to [facility] for your MRI and they will call you directly to schedule your appointment. They are located at [address] [facility]. If you need to contact them directly please call [phone].  Start TOPIRAMATE  25MG  AT BEDTIME.  Contact us  in 4 weeks with update and we can increase dose if needed. Take UBRELVY 100MG  at earliest onset of headache.  May repeat dose once in 2 hours if needed.  Maximum 2 tablets in 24 hours. Limit use of pain relievers to no more than 9 days out of the month.  These medications include acetaminophen , NSAIDs (ibuprofen /Advil /Motrin , naproxen/Aleve, triptans (Imitrex /sumatriptan ), Excedrin, and narcotics.  This will help reduce risk of rebound headaches. Be aware of common food triggers:  - Caffeine:  coffee, black tea, cola, Mt. Dew  - Chocolate  - Dairy:  aged cheeses (brie, blue, cheddar, gouda, Parmasan, provolone, romano, Swiss, etc), chocolate milk, buttermilk, sour cream, limit eggs and yogurt  - Nuts, peanut butter  - Alcohol  - Cereals/grains:  FRESH breads (fresh bagels, sourdough, doughnuts), yeast productions  - Processed/canned/aged/cured meats (pre-packaged deli meats, hotdogs)  - MSG/glutamate:  soy sauce, flavor enhancer, pickled/preserved/marinated foods  - Sweeteners:  aspartame (Equal, Nutrasweet).  Sugar and Splenda are okay  - Vegetables:  legumes (lima beans, lentils, snow peas, fava beans, pinto peans, peas, garbanzo beans), sauerkraut, onions, olives, pickles  - Fruit:  avocados, bananas, citrus fruit (orange, lemon, grapefruit), mango  - Other:  Frozen meals, macaroni and cheese Routine exercise Stay adequately hydrated (aim for 64 oz water daily) Keep headache diary Maintain proper stress management Maintain proper sleep hygiene Do not skip meals Consider supplements:  magnesium citrate 400mg  daily, riboflavin 400mg  daily, coenzyme Q10 100mg  three  times daily.

## 2024-07-01 NOTE — Progress Notes (Signed)
 Medication Samples have been provided to the patient.  Drug name: Holland       Strength: 100 mg        Qty: 4  LOT: [id number]  Exp.Date: 4/28  Dosing instructions: as needed  The patient has been instructed regarding the correct time, dose, and frequency of taking this medication, including desired effects and most common side effects.   Janice Stewart 3:58 PM 07/01/2024

## 2024-07-07 ENCOUNTER — Encounter: Payer: Self-pay | Admitting: Neurology

## 2024-07-14 ENCOUNTER — Ambulatory Visit: Admitting: Physical Medicine and Rehabilitation

## 2024-07-14 ENCOUNTER — Other Ambulatory Visit: Payer: Self-pay

## 2024-07-14 ENCOUNTER — Encounter: Payer: Self-pay | Admitting: Physical Medicine and Rehabilitation

## 2024-07-14 DIAGNOSIS — M7918 Myalgia, other site: Secondary | ICD-10-CM

## 2024-07-14 DIAGNOSIS — M419 Scoliosis, unspecified: Secondary | ICD-10-CM | POA: Diagnosis not present

## 2024-07-14 DIAGNOSIS — M546 Pain in thoracic spine: Secondary | ICD-10-CM | POA: Diagnosis not present

## 2024-07-14 NOTE — Progress Notes (Signed)
 Pain Scale   Average Pain 6 Patient advising her spine pain is radiating to her right shoulder.        +Driver, -BT, -Dye Allergies.

## 2024-07-14 NOTE — Progress Notes (Signed)
 Janice Stewart - 38 y.o. female MRN [id number]  Date of birth: 04/21/1986  Office Visit Note: Visit Date: 07/14/2024 PCP: Jaycee Greig PARAS, NP Referred by: Jaycee Greig PARAS, NP  Subjective: Chief Complaint  Patient presents with   Spine - Pain   HPI: Janice Stewart is a 38 y.o. female who comes in today per the request of Greig Jaycee, NP for evaluation of acute right sided thoracic back pain. Pain started about 1 month ago. Pain is localized to right scapular region. Her pain worsens with standing and activity. Stress also causes her pain to become worse. She reports increased pain when wearing bra, due to the straps. She describes pain as tight and sore sensation, currently rates as 5 out of 10. Some relief of pain with home exercise regimen, rest and use of medications. Some relief of pain with Ibuprofen . No history of formal physical therapy for upper back pain. She is concerned scoliosis is causing her pain. She was told several years ago that she has scoliosis in her lower back. Prior lumbar radiographs from 2023 show curvature to the left. She has history of CVA, sickle cell trait and gestational choriocarcinoma. She is followed by [facility]. Patient denies focal weakness, numbness and tingling. No recent trauma or falls.      Review of Systems  Musculoskeletal:  Positive for back pain and myalgias.  Neurological:  Negative for tingling, sensory change, focal weakness and weakness.  All other systems reviewed and are negative.  Otherwise per HPI.  Assessment & Plan: Visit Diagnoses:    ICD-10-CM   1. Acute right-sided thoracic back pain  M54.6 XR Thoracic Spine 2 View    2. Myofascial pain syndrome  M79.18     3. Scoliosis of thoracolumbar spine, unspecified scoliosis type  M41.9        Plan: Findings:  Acute right sided thoracic back pain. Pain localized to right scapular region. Patient continues to have pain despite good  conservative therapies such as home exercise regimen, rest and use of medications. I obtained thoracic radiographs in the office today that show rightward curvature.  Known leftward curvature to lumbar spine. Patients clinical presentation and exam are consistent with myofacial pain syndrome. Multiple palpable trigger points noted to right rhomboid region today. She is particularly tender upon palpation today. We discussed treatment plan in detail today. Next step is to obtain thoracic MRI imaging. I also placed order for short course of formal physical therapy. I do think she will benefit from manual treatments and dry needling. She would like to see surgeon to discuss treatment for scoliosis. This could be source of pain for her. We will plan to make referral after thoracic MRI imaging is back. Her exam today is non focal, good strength noted to bilateral upper extremities.     Meds & Orders: No orders of the defined types were placed in this encounter.   Orders Placed This Encounter  Procedures   XR Thoracic Spine 2 View    Follow-up: Return for Thoracic MRI review.   Procedures: No procedures performed      Clinical History: No specialty comments available.   She reports that she has never smoked. She has never been exposed to tobacco smoke. She has never used smokeless tobacco.  Recent Labs    01/30/24 1334  HGBA1C 5.0    Objective:  VS:  HT:    WT:   BMI:  BP:   HR: bpm  TEMP: ( )  RESP:  Physical Exam Vitals and nursing note reviewed.  HENT:     Head: Normocephalic and atraumatic.     Right Ear: External ear normal.     Left Ear: External ear normal.     Nose: Nose normal.     Mouth/Throat:     Mouth: Mucous membranes are moist.  Eyes:     Extraocular Movements: Extraocular movements intact.  Cardiovascular:     Rate and Rhythm: Normal rate.     Pulses: Normal pulses.  Pulmonary:     Effort: Pulmonary effort is normal.  Abdominal:     General: Abdomen is  flat. There is no distension.  Musculoskeletal:        General: Tenderness present.     Cervical back: Normal range of motion.     Comments: Normal thoracic kyphosis noted. No tenderness noted upon palpation of spinous processes. No step off deformity. No pain noted with flexion, extension, lateral bending and rotation. Multiple palpable trigger points noted to right rhomboid region. Good strength noted to bilateral upper extremities.    Skin:    General: Skin is warm and dry.     Capillary Refill: Capillary refill takes less than 2 seconds.  Neurological:     General: No focal deficit present.     Mental Status: She is alert and oriented to person, place, and time.  Psychiatric:        Mood and Affect: Mood normal.        Behavior: Behavior normal.     Ortho Exam  Imaging: XR Thoracic Spine 2 View Result Date: 07/14/2024 AP and lateral radiographs of thoracic spine show rightward curvature. Normal thoracic kyphosis. Well preserved disc spacing. No fractures or dislocations.    Past Medical/Family/Surgical/Social History: Medications & Allergies reviewed per EMR, new medications updated. Patient Active Problem List   Diagnosis Date Noted   Diastasis recti 07/25/2023   Umbilical hernia without obstruction and without gangrene 07/25/2023   Left inguinal hernia 06/20/2023   Low back pain 03/26/2022   Bone infarct (HCC) 02/22/2022   Simple obesity 09/22/2021   Sickle cell trait 02/22/2020   Gestational choriocarcinoma (HCC) in 2017 01/25/2020   History of CVA (cerebrovascular accident) 01/25/2020   Past Medical History:  Diagnosis Date   Anemia    Bone infarct (HCC)    S1 vertebrae   Diastasis recti 06/2023   Essential hypertension    Gestational choriocarcinoma (HCC) 2017   Headache    Left inguinal hernia 05/2023   Sickle cell trait    Stroke (HCC) 2017   around the time of chemotherapy   Umbilical hernia 05/2023   Family History  Problem Relation Age of Onset    Migraines Mother    Varicose Veins Mother    Hypertension Father    Cancer Father        prostate   Past Surgical History:  Procedure Laterality Date   INSERTION OF MESH  07/25/2023   Procedure: INSERTION OF MESH;  Surgeon: Desiderio Schanz, MD;  Location: [facility] ORS;  Service: General;;   LIVER BIOPSY  2017   LUNG BIOPSY  2017   PORT-A-CATH REMOVAL  2018   PORTA CATH INSERTION  2017   ROBOT ASSISTED INGUINAL HERNIA REPAIR Left 06/20/2023   AND Creation of Left Posterior Rectus-Transversalis Fascia Advancment Flap for Coverage of Pelvic Wound (200 cm)   Social History   Occupational History   Not on file  Tobacco Use   Smoking status: Never    Passive exposure: Never   Smokeless tobacco: Never  Vaping Use   Vaping status: Never Used  Substance and Sexual Activity   Alcohol use: Not Currently   Drug use: Never   Sexual activity: Yes    Birth control/protection: I.U.D.

## 2024-07-22 ENCOUNTER — Inpatient Hospital Stay: Admission: RE | Admit: 2024-07-22 | Discharge: 2024-07-22 | Attending: Neurology

## 2024-07-22 ENCOUNTER — Encounter: Payer: Self-pay | Admitting: Physical Medicine and Rehabilitation

## 2024-07-22 DIAGNOSIS — M542 Cervicalgia: Secondary | ICD-10-CM | POA: Diagnosis not present

## 2024-07-22 DIAGNOSIS — M5020 Other cervical disc displacement, unspecified cervical region: Secondary | ICD-10-CM | POA: Diagnosis not present

## 2024-07-22 DIAGNOSIS — Z8673 Personal history of transient ischemic attack (TIA), and cerebral infarction without residual deficits: Secondary | ICD-10-CM

## 2024-07-22 DIAGNOSIS — M4802 Spinal stenosis, cervical region: Secondary | ICD-10-CM | POA: Diagnosis not present

## 2024-07-22 DIAGNOSIS — R519 Headache, unspecified: Secondary | ICD-10-CM | POA: Diagnosis not present

## 2024-07-22 DIAGNOSIS — G43119 Migraine with aura, intractable, without status migrainosus: Secondary | ICD-10-CM

## 2024-07-27 ENCOUNTER — Ambulatory Visit: Payer: Self-pay | Admitting: Neurology

## 2024-07-27 DIAGNOSIS — E041 Nontoxic single thyroid nodule: Secondary | ICD-10-CM

## 2024-07-28 ENCOUNTER — Ambulatory Visit: Admitting: Dermatology

## 2024-07-29 ENCOUNTER — Inpatient Hospital Stay
Admission: RE | Admit: 2024-07-29 | Discharge: 2024-07-29 | Attending: Physical Medicine and Rehabilitation | Admitting: Physical Medicine and Rehabilitation

## 2024-07-29 DIAGNOSIS — M7918 Myalgia, other site: Secondary | ICD-10-CM

## 2024-07-29 DIAGNOSIS — M47814 Spondylosis without myelopathy or radiculopathy, thoracic region: Secondary | ICD-10-CM | POA: Diagnosis not present

## 2024-07-29 DIAGNOSIS — M419 Scoliosis, unspecified: Secondary | ICD-10-CM

## 2024-07-29 DIAGNOSIS — M546 Pain in thoracic spine: Secondary | ICD-10-CM

## 2024-07-29 NOTE — Therapy (Signed)
 OUTPATIENT PHYSICAL THERAPY CERVICAL EVALUATION   Patient Name: Janice Stewart MRN: [id number] DOB:August 23, 1985, 38 y.o., female Today's Date: 07/29/2024  END OF SESSION:   Past Medical History:  Diagnosis Date   Anemia    Bone infarct (HCC)    S1 vertebrae   Diastasis recti 06/2023   Essential hypertension    Gestational choriocarcinoma (HCC) 2017   Headache    Left inguinal hernia 05/2023   Sickle cell trait    Stroke (HCC) 2017   around the time of chemotherapy   Umbilical hernia 05/2023   Past Surgical History:  Procedure Laterality Date   INSERTION OF MESH  07/25/2023   Procedure: INSERTION OF MESH;  Surgeon: Desiderio Schanz, MD;  Location: [facility] ORS;  Service: General;;   LIVER BIOPSY  2017   LUNG BIOPSY  2017   PORT-A-CATH REMOVAL  2018   PORTA CATH INSERTION  2017   ROBOT ASSISTED INGUINAL HERNIA REPAIR Left 06/20/2023   AND Creation of Left Posterior Rectus-Transversalis Fascia Advancment Flap for Coverage of Pelvic Wound (200 cm)   Patient Active Problem List   Diagnosis Date Noted   Diastasis recti 07/25/2023   Umbilical hernia without obstruction and without gangrene 07/25/2023   Left inguinal hernia 06/20/2023   Low back pain 03/26/2022   Bone infarct (HCC) 02/22/2022   Simple obesity 09/22/2021   Sickle cell trait 02/22/2020   Gestational choriocarcinoma (HCC) in 2017 01/25/2020   History of CVA (cerebrovascular accident) 01/25/2020    PCP: Jaycee Greig PARAS, NP   REFERRING PROVIDER: Trudy Duwaine BRAVO, NP   REFERRING DIAG:  M54.6 (ICD-10-CM) - Acute right-sided thoracic back pain  M79.18 (ICD-10-CM) - Myofascial pain syndrome  M41.9 (ICD-10-CM) - Scoliosis of thoracolumbar spine, unspecified scoliosis type    THERAPY DIAG:  No diagnosis found.  Rationale for Evaluation and Treatment: Rehabilitation  ONSET DATE: ***  SUBJECTIVE:                                                                                                                                                                                                          SUBJECTIVE STATEMENT: *** Hand dominance: {MISC; OT HAND DOMINANCE:[id number]}  PERTINENT HISTORY:  ***  PAIN:  Are you having pain? Yes: NPRS scale: *** Pain location: *** Pain description: *** Aggravating factors: *** Relieving factors: ***  PRECAUTIONS: {Therapy precautions:24002}  RED FLAGS: {PT Red Flags:29287}     WEIGHT BEARING RESTRICTIONS: {Yes ***/No:24003}  FALLS:  Has patient fallen in last 6 months? {fallsyesno:27318}  LIVING ENVIRONMENT: Lives with: {[facility] lives with:25569::lives with their family}  Lives in: {Lives in:25570} Stairs: {opstairs:27293} Has following equipment at home: {Assistive devices:23999}  OCCUPATION: ***  PLOF: {PLOF:24004}  PATIENT GOALS: ***  NEXT MD VISIT: ***  OBJECTIVE:  Note: Objective measures were completed at Evaluation unless otherwise noted.  DIAGNOSTIC FINDINGS:  Yes: NPRS scale: *** Pain location: *** Pain description: *** Aggravating factors: *** Relieving factors: ***  PATIENT SURVEYS:  {rehab surveys:24030}  COGNITION: Overall cognitive status: {cognition:24006}  SENSATION: {sensation:27233}  POSTURE: {posture:25561}  PALPATION: ***   CERVICAL ROM:   {AROM/PROM:27142} ROM A/PROM (deg) eval  Flexion   Extension   Right lateral flexion   Left lateral flexion   Right rotation   Left rotation    (Blank rows = not tested)  UPPER EXTREMITY ROM:  {AROM/PROM:27142} ROM Right eval Left eval  Shoulder flexion    Shoulder extension    Shoulder abduction    Shoulder adduction    Shoulder extension    Shoulder internal rotation    Shoulder external rotation    Elbow flexion    Elbow extension    Wrist flexion    Wrist extension    Wrist ulnar deviation    Wrist radial deviation    Wrist pronation    Wrist supination     (Blank rows = not tested)  UPPER EXTREMITY MMT:  MMT Right eval Left eval   Shoulder flexion    Shoulder extension    Shoulder abduction    Shoulder adduction    Shoulder extension    Shoulder internal rotation    Shoulder external rotation    Middle trapezius    Lower trapezius    Elbow flexion    Elbow extension    Wrist flexion    Wrist extension    Wrist ulnar deviation    Wrist radial deviation    Wrist pronation    Wrist supination    Grip strength     (Blank rows = not tested)  CERVICAL SPECIAL TESTS:  {Cervical special tests:25246}  FUNCTIONAL TESTS:  {Functional tests:24029}  TREATMENT DATE:  [facility] Adult PT Treatment:                                                DATE: 07/31/24 Therapeutic Exercise: *** Manual Therapy: *** Neuromuscular re-ed: *** Therapeutic Activity: *** Modalities: *** Self Care: ***                                                                                                                                PATIENT EDUCATION:  Education details: *** Person educated: {Person educated:25204} Education method: {Education Method:25205} Education comprehension: {Education Comprehension:25206}  HOME EXERCISE PROGRAM: ***  ASSESSMENT:  CLINICAL IMPRESSION: Patient is a 38 y.o. female who was seen today for physical therapy evaluation and treatment for  M54.6 (ICD-10-CM) - Acute right-sided thoracic  back pain  M79.18 (ICD-10-CM) - Myofascial pain syndrome  M41.9 (ICD-10-CM) - Scoliosis of thoracolumbar spine, unspecified scoliosis type  .   OBJECTIVE IMPAIRMENTS: {opptimpairments:25111}.   ACTIVITY LIMITATIONS: {activitylimitations:27494}  PARTICIPATION LIMITATIONS: {participationrestrictions:25113}  PERSONAL FACTORS: {Personal factors:25162} are also affecting patient's functional outcome.   REHAB POTENTIAL: {rehabpotential:25112}  CLINICAL DECISION MAKING: {clinical decision making:25114}  EVALUATION COMPLEXITY: {Evaluation complexity:25115}   GOALS:   SHORT TERM GOALS: Target date:  08/15/23  Pt will be Ind in an initial HEP  Baseline: started Goal status: INITIAL  2.  *** Baseline:  Goal status: INITIAL  3.  *** Baseline:  Goal status: INITIAL  4.  *** Baseline:  Goal status: INITIAL  5.  *** Baseline:  Goal status: INITIAL  6.  *** Baseline:  Goal status: INITIAL  LONG TERM GOALS: Target date: ***  Pt will be Ind in a final HEP to maintain achieved LOF  Baseline: satrted Goal status: INITIAL  2.  *** Baseline:  Goal status: INITIAL  3.  *** Baseline:  Goal status: INITIAL  4.  *** Baseline:  Goal status: INITIAL  5.  *** Baseline:  Goal status: INITIAL  6.  *** Baseline:  Goal status: INITIAL   PLAN:  PT FREQUENCY: {rehab frequency:25116}  PT DURATION: {rehab duration:25117}  PLANNED INTERVENTIONS: {rehab planned interventions:25118::97110-Therapeutic exercises,97530- Therapeutic [phone]- Neuromuscular re-education,97535- Self Rjmz,02859- Manual therapy,Patient/Family education}  PLAN FOR NEXT SESSION: ***   Suan Pyeatt, PT 07/29/2024, 5:56 PM

## 2024-07-30 ENCOUNTER — Other Ambulatory Visit: Payer: Self-pay | Admitting: Neurology

## 2024-07-30 ENCOUNTER — Ambulatory Visit: Payer: Medicaid Other | Admitting: Dermatology

## 2024-07-30 ENCOUNTER — Encounter: Payer: Self-pay | Admitting: Neurology

## 2024-07-31 ENCOUNTER — Other Ambulatory Visit: Payer: Self-pay

## 2024-07-31 ENCOUNTER — Ambulatory Visit: Attending: Physical Medicine and Rehabilitation

## 2024-07-31 DIAGNOSIS — M546 Pain in thoracic spine: Secondary | ICD-10-CM | POA: Insufficient documentation

## 2024-07-31 DIAGNOSIS — R293 Abnormal posture: Secondary | ICD-10-CM | POA: Diagnosis present

## 2024-07-31 DIAGNOSIS — M6281 Muscle weakness (generalized): Secondary | ICD-10-CM | POA: Diagnosis present

## 2024-07-31 DIAGNOSIS — M7918 Myalgia, other site: Secondary | ICD-10-CM | POA: Insufficient documentation

## 2024-07-31 DIAGNOSIS — M419 Scoliosis, unspecified: Secondary | ICD-10-CM | POA: Diagnosis not present

## 2024-07-31 DIAGNOSIS — M62838 Other muscle spasm: Secondary | ICD-10-CM | POA: Insufficient documentation

## 2024-08-03 ENCOUNTER — Ambulatory Visit: Payer: Self-pay | Admitting: Physical Medicine and Rehabilitation

## 2024-08-04 ENCOUNTER — Other Ambulatory Visit (HOSPITAL_COMMUNITY): Payer: Self-pay

## 2024-08-05 ENCOUNTER — Other Ambulatory Visit (HOSPITAL_COMMUNITY): Payer: Self-pay

## 2024-08-05 ENCOUNTER — Telehealth: Payer: Self-pay | Admitting: Pharmacy Technician

## 2024-08-05 NOTE — Telephone Encounter (Signed)
 Pharmacy Patient Advocate Encounter   Received notification from CoverMyMeds that prior authorization for UBRELVY 100MG  is required/requested.   Insurance verification completed.   The patient is insured through [employer].   Per test claim: PA required; PA submitted to above mentioned insurance via Latent Key/confirmation #/EOC [facility] Status is pending

## 2024-08-05 NOTE — Telephone Encounter (Signed)
 Pharmacy Patient Advocate Encounter  Received notification from [employer] that Prior Authorization for UBRELVY 100MG  has been APPROVED from 12.24.25 to 12.24.26. Ran test claim, Copay is $4. This test claim was processed through [facility]- copay amounts may vary at other pharmacies due to pharmacy/plan contracts, or as the patient moves through the different stages of their insurance plan.   PA #/Case ID/Reference #: [id number]

## 2024-08-18 NOTE — Therapy (Signed)
 " OUTPATIENT PHYSICAL THERAPY CERVICAL TREATMENT   Patient Name: Janice Stewart MRN: [id number] DOB:03-08-1986, 39 y.o., female Today's Date: 07/29/2024  END OF SESSION:      Past Medical History:  Diagnosis Date   Anemia    Bone infarct (HCC)    S1 vertebrae   Diastasis recti 06/2023   Essential hypertension    Gestational choriocarcinoma (HCC) 2017   Headache    Left inguinal hernia 05/2023   Sickle cell trait    Stroke (HCC) 2017   around the time of chemotherapy   Umbilical hernia 05/2023   Past Surgical History:  Procedure Laterality Date   INSERTION OF MESH  07/25/2023   Procedure: INSERTION OF MESH;  Surgeon: Desiderio Schanz, MD;  Location: [facility] ORS;  Service: General;;   LIVER BIOPSY  2017   LUNG BIOPSY  2017   PORT-A-CATH REMOVAL  2018   PORTA CATH INSERTION  2017   ROBOT ASSISTED INGUINAL HERNIA REPAIR Left 06/20/2023   AND Creation of Left Posterior Rectus-Transversalis Fascia Advancment Flap for Coverage of Pelvic Wound (200 cm)   Patient Active Problem List   Diagnosis Date Noted   Diastasis recti 07/25/2023   Umbilical hernia without obstruction and without gangrene 07/25/2023   Left inguinal hernia 06/20/2023   Low back pain 03/26/2022   Bone infarct (HCC) 02/22/2022   Simple obesity 09/22/2021   Sickle cell trait 02/22/2020   Gestational choriocarcinoma (HCC) in 2017 01/25/2020   History of CVA (cerebrovascular accident) 01/25/2020    PCP: Jaycee Greig PARAS, NP   REFERRING PROVIDER: Trudy Duwaine BRAVO, NP   REFERRING DIAG:  M54.6 (ICD-10-CM) - Acute right-sided thoracic back pain  M79.18 (ICD-10-CM) - Myofascial pain syndrome  M41.9 (ICD-10-CM) - Scoliosis of thoracolumbar spine, unspecified scoliosis type    THERAPY DIAG:  No diagnosis found.  Rationale for Evaluation and Treatment: Rehabilitation  ONSET DATE: Chronic since 2017 with increase in pain the last 2-3 months  SUBJECTIVE:                                                                                                                                                                                                          SUBJECTIVE STATEMENT:   EVAL:Pt reports chronic R upper/mid back pain since 2017 with increase in pain the last 2-3 months.  Hand dominance: Right  PERTINENT HISTORY:  [place] 2017; [place] 2017-affecting R side as per pt  PAIN:  Are you having pain? Yes: NPRS scale: Pain range: 2-10/10. Current: 6/10 Pain location: R upper/mid back Pain description: Sharp, N/T in area  Aggravating factors: prolonged standing, physical and mental stress, increased use of R arm Relieving factors: Pain medications, lidocaine  patch  PRECAUTIONS: None  RED FLAGS: None     WEIGHT BEARING RESTRICTIONS: No  FALLS:  Has patient fallen in last 6 months? No  LIVING ENVIRONMENT: Lives with: lives with their family Lives in: House/apartment Able to access home  OCCUPATION: Home maker  PLOF: Independent  PATIENT GOALS: Less pain and to be more active  OBJECTIVE:  Note: Objective measures were completed at Evaluation unless otherwise noted.  DIAGNOSTIC FINDINGS:  IMPRESSION: 1. No acute intracranial abnormality. 2. C5-C6 small central disc protrusion without central spinal canal or neural foraminal stenosis. 3. Incidental 1.6 cm left thyroid  nodule; dedicated outpatient thyroid  ultrasound recommended.  AP and lateral radiographs of thoracic spine show rightward curvature.  Normal thoracic kyphosis. Well preserved disc spacing. No fractures or  dislocations.    PATIENT SURVEYS:  Modified Oswestry: 20/50=40%  Interpretation of scores: Score Category Description  0-20% Minimal Disability The patient can cope with most living activities. Usually no treatment is indicated apart from advice on lifting, sitting and exercise  21-40% Moderate Disability The patient experiences more pain and difficulty with sitting, lifting and standing. Travel and social life  are more difficult and they may be disabled from work. Personal care, sexual activity and sleeping are not grossly affected, and the patient can usually be managed by conservative means  41-60% Severe Disability Pain remains the main problem in this group, but activities of daily living are affected. These patients require a detailed investigation  61-80% Crippled Back pain impinges on all aspects of the patients life. Positive intervention is required  81-100% Bed-bound These patients are either bed-bound or exaggerating their symptoms  Bluford FORBES Zoe DELENA Karon DELENA, et al. Surgery versus conservative management of stable thoracolumbar fracture: the PRESTO feasibility RCT. [place] ([place]): [employer]; 2021 Nov. [facility] Technology Assessment, No. 25.62.) Appendix 3, Oswestry Disability Index category descriptors. Available from: [email or web address].cz  Minimally Clinically Important Difference (MCID) = 12.8%  COGNITION: Overall cognitive status: Within functional limits for tasks assessed  SENSATION: WFL  POSTURE: rounded shoulders, forward head, and reverse S scoliosis c R rib hump  PALPATION: TTP c increased muscle tension of the R interscapular and paraspinal muscles, and r upper trap and levator   CERVICAL ROM:  WNLs Active ROM A/PROM (deg) eval  Flexion   Extension   Right lateral flexion   Left lateral flexion Puling R cervical and mid back  Right rotation   Left rotation Puling R cervical and mid back   (Blank rows = not tested)  UPPER EXTREMITY ROM:  WNLs Active ROM Right eval Left eval  Shoulder flexion    Shoulder extension    Shoulder abduction    Shoulder adduction    Shoulder extension    Shoulder internal rotation    Shoulder external rotation    Elbow flexion    Elbow extension    Wrist flexion    Wrist extension    Wrist ulnar deviation    Wrist radial deviation    Wrist pronation    Wrist supination     (Blank  rows = not tested)  UPPER EXTREMITY MMT:  MMT Right eval Left eval  Shoulder flexion 4+ pain 5  Shoulder extension    Shoulder abduction    Shoulder adduction    Shoulder extension    Shoulder internal rotation    Shoulder external rotation    Middle trapezius 4 pain  5  Lower trapezius    Elbow flexion    Elbow extension    Wrist flexion    Wrist extension    Wrist ulnar deviation    Wrist radial deviation    Wrist pronation    Wrist supination    Grip strength 40 seemingly due to CVA 80   (Blank rows = not tested)  CERVICAL SPECIAL TESTS:  Spurling's test: Negative   TREATMENT DATE:  [facility] Adult PT Treatment:                                                DATE: 08/19/24 Therapeutic Exercise: Standing Quadratus Lumborum Stretch with Doorway  - 1-2 x daily - 7 x weekly - 1 sets - 3 reps - 30 hold Manual Therapy: *** Neuromuscular re-ed: *** Therapeutic Activity: *** Modalities: *** Self Care: ***  [facility] Adult PT Treatment:                                                DATE: 07/31/24 Therapeutic Exercise: QL stretch Self Care: Tennis ball on wall massage f/b heating pad or cold pack to address taut muscles  and pain                                                                                                                                 PATIENT EDUCATION:  Education details: Eval findings, POC, HEP, self care Person educated: Patient Education method: Explanation, Demonstration, Tactile cues, Verbal cues, and Handouts Education comprehension: verbalized understanding, returned demonstration, verbal cues required, and tactile cues required  HOME EXERCISE PROGRAM: Access Code: [email or web address] URL: [email or web address] Date: 07/31/2024 Prepared by: Dasie Daft  Exercises - Standing Quadratus Lumborum Stretch with Doorway  - 1-2 x daily - 7 x weekly - 1 sets - 3 reps - 30 hold  ASSESSMENT:  CLINICAL IMPRESSION: Patient is a 39 y.o. female who  was seen today for physical therapy evaluation and treatment for  M54.6 (ICD-10-CM) - Acute right-sided thoracic back pain  M79.18 (ICD-10-CM) - Myofascial pain syndrome  M41.9 (ICD-10-CM) - Scoliosis of thoracolumbar spine, unspecified scoliosis type  Pt presents to PT with upper/mid back pain which seems related to the reverse S scoliosis c r rib hump. Additionally, the R UE is weaker than the L seemly due to a CVA in 2017 affecting her R side  OBJECTIVE IMPAIRMENTS: decreased strength, increased muscle spasms, impaired UE functional use, postural dysfunction, and pain.   ACTIVITY LIMITATIONS: carrying, lifting, bathing, dressing, reach over head, and caring for others  PARTICIPATION LIMITATIONS: meal prep, cleaning, and laundry  PERSONAL FACTORS: Past/current experiences and Time since onset of injury/illness/exacerbation are also affecting patient's  functional outcome.   REHAB POTENTIAL: Good  CLINICAL DECISION MAKING: Evolving/moderate complexity  EVALUATION COMPLEXITY: Moderate   GOALS:   SHORT TERM GOALS: Target date: 08/21/24  Pt will be Ind in an initial HEP  Baseline: started Goal status: INITIAL  2.  Pt will report 25% or greater improvement in R upper/mid back with daily activities Baseline: 2-10/10 Goal status: INITIAL   LONG TERM GOALS: Target date: 09/18/24  Pt will be Ind in a final HEP to maintain achieved LOF  Baseline: satrted Goal status: INITIAL  2.  Pt will report 75% or greater improvement in R upper/mid back with daily activities for improved function and QOL Baseline:  Goal status: INITIAL  3.  Pt will be able to lift 5# 10x to overhead self c only min increase in pain as demonstration of improved function of the R UE for household activities Baseline: TBA Goal status: INITIAL  4.  Pt's Mod oswestry score will improve by the MCID to 27% of better as indication of improved function  Baseline: 40% Goal status: INITIAL   PLAN:  PT FREQUENCY:  2x/week  PT DURATION: 6 weeks  PLANNED INTERVENTIONS: 97164- PT Re-evaluation, 97110-Therapeutic exercises, 97530- Therapeutic activity, [id number]- Neuromuscular re-education, 97535- Self Care, 02859- Manual therapy, [id number]- Gait training, [phone]- Electrical stimulation (unattended), 20560 (1-2 muscles), 20561 (3+ muscles)- Dry Needling, Patient/Family education, Taping, Joint mobilization, Spinal mobilization, Cryotherapy, and Moist heat  PLAN FOR NEXT SESSION: Assess UE lifting to overhead self; assess response to HEP; progress therex as indicated; use of modalities, manual therapy; and TPDN as indicated.  Delona Clasby MS, PT 08/18/2024 1:04 PM      "

## 2024-08-19 ENCOUNTER — Encounter: Payer: Self-pay | Admitting: Family

## 2024-08-19 ENCOUNTER — Ambulatory Visit: Attending: Physical Medicine and Rehabilitation

## 2024-08-19 DIAGNOSIS — M62838 Other muscle spasm: Secondary | ICD-10-CM | POA: Diagnosis present

## 2024-08-19 DIAGNOSIS — R293 Abnormal posture: Secondary | ICD-10-CM | POA: Diagnosis present

## 2024-08-19 DIAGNOSIS — M6281 Muscle weakness (generalized): Secondary | ICD-10-CM | POA: Diagnosis present

## 2024-08-20 NOTE — Therapy (Signed)
 " OUTPATIENT PHYSICAL THERAPY CERVICAL TREATMENT   Patient Name: Janice Stewart MRN: [id number] DOB:May 29, 1986, 39 y.o., female Today's Date: 07/29/2024  END OF SESSION:   PT End of Session - 08/21/24 1022     Visit Number 3    Number of Visits 12    Date for Recertification  09/18/24    Authorization Type [place] MEDICAID HEALTHY BLUE    Authorization Time Period Approved 6 PT visits from 07/31/24-09/28/24    Authorization - Visit Number 3    PT Start Time 1018    PT Stop Time 1110    PT Time Calculation (min) 52 min    Activity Tolerance Patient tolerated treatment well    Behavior During Therapy [facility] for tasks assessed/performed             Past Medical History:  Diagnosis Date   Anemia    Bone infarct (HCC)    S1 vertebrae   Diastasis recti 06/2023   Essential hypertension    Gestational choriocarcinoma (HCC) 2017   Headache    Left inguinal hernia 05/2023   Sickle cell trait    Stroke (HCC) 2017   around the time of chemotherapy   Umbilical hernia 05/2023   Past Surgical History:  Procedure Laterality Date   INSERTION OF MESH  07/25/2023   Procedure: INSERTION OF MESH;  Surgeon: Desiderio Schanz, MD;  Location: [facility] ORS;  Service: General;;   LIVER BIOPSY  2017   LUNG BIOPSY  2017   PORT-A-CATH REMOVAL  2018   PORTA CATH INSERTION  2017   ROBOT ASSISTED INGUINAL HERNIA REPAIR Left 06/20/2023   AND Creation of Left Posterior Rectus-Transversalis Fascia Advancment Flap for Coverage of Pelvic Wound (200 cm)   Patient Active Problem List   Diagnosis Date Noted   Diastasis recti 07/25/2023   Umbilical hernia without obstruction and without gangrene 07/25/2023   Left inguinal hernia 06/20/2023   Low back pain 03/26/2022   Bone infarct (HCC) 02/22/2022   Simple obesity 09/22/2021   Sickle cell trait 02/22/2020   Gestational choriocarcinoma (HCC) in 2017 01/25/2020   History of CVA (cerebrovascular accident) 01/25/2020    PCP: Jaycee Greig PARAS, NP   REFERRING  PROVIDER: Trudy Duwaine BRAVO, NP   REFERRING DIAG:  M54.6 (ICD-10-CM) - Acute right-sided thoracic back pain  M79.18 (ICD-10-CM) - Myofascial pain syndrome  M41.9 (ICD-10-CM) - Scoliosis of thoracolumbar spine, unspecified scoliosis type    THERAPY DIAG:  No diagnosis found.  Rationale for Evaluation and Treatment: Rehabilitation  ONSET DATE: Chronic since 2017 with increase in pain the last 2-3 months  SUBJECTIVE:  SUBJECTIVE STATEMENT: R upper back pain is the same felt better that day after the ;last PT session.  EVAL:Pt reports chronic R upper/mid back pain since 2017 with increase in pain the last 2-3 months.  Hand dominance: Right  PERTINENT HISTORY:  [place] 2017; [place] 2017-affecting R side as per pt  PAIN:  Are you having pain? Yes: NPRS scale: Pain range: 2-10/10. Current: 5/10 Pain location: R upper/mid back Pain description: Sharp, N/T in area  Aggravating factors: prolonged standing, physical and mental stress, increased use of R arm Relieving factors: Pain medications, lidocaine  patch  PRECAUTIONS: None  RED FLAGS: None     WEIGHT BEARING RESTRICTIONS: No  FALLS:  Has patient fallen in last 6 months? No  LIVING ENVIRONMENT: Lives with: lives with their family Lives in: House/apartment Able to access home  OCCUPATION: Home maker  PLOF: Independent  PATIENT GOALS: Less pain and to be more active  OBJECTIVE:  Note: Objective measures were completed at Evaluation unless otherwise noted.  DIAGNOSTIC FINDINGS:  IMPRESSION: 1. No acute intracranial abnormality. 2. C5-C6 small central disc protrusion without central spinal canal or neural foraminal stenosis. 3. Incidental 1.6 cm left thyroid  nodule; dedicated outpatient thyroid  ultrasound recommended.  AP  and lateral radiographs of thoracic spine show rightward curvature.  Normal thoracic kyphosis. Well preserved disc spacing. No fractures or  dislocations.   MRI 07/29/24 IMPRESSION: 1. No acute findings in the thoracic spine. 2. Rightward curvature of the mid thoracic spine centered at T7. 3. 2.3 cm left thyroid  nodule, for which non-emergent thyroid  ultrasound is recommended.   PATIENT SURVEYS:  Modified Oswestry: 20/50=40%  Interpretation of scores: Score Category Description  0-20% Minimal Disability The patient can cope with most living activities. Usually no treatment is indicated apart from advice on lifting, sitting and exercise  21-40% Moderate Disability The patient experiences more pain and difficulty with sitting, lifting and standing. Travel and social life are more difficult and they may be disabled from work. Personal care, sexual activity and sleeping are not grossly affected, and the patient can usually be managed by conservative means  41-60% Severe Disability Pain remains the main problem in this group, but activities of daily living are affected. These patients require a detailed investigation  61-80% Crippled Back pain impinges on all aspects of the patients life. Positive intervention is required  81-100% Bed-bound These patients are either bed-bound or exaggerating their symptoms  Bluford FORBES Zoe DELENA Karon DELENA, et al. Surgery versus conservative management of stable thoracolumbar fracture: the PRESTO feasibility RCT. [place] ([place]): [employer]; 2021 Nov. [facility] Technology Assessment, No. 25.62.) Appendix 3, Oswestry Disability Index category descriptors. Available from: [email or web address].cz  Minimally Clinically Important Difference (MCID) = 12.8%  COGNITION: Overall cognitive status: Within functional limits for tasks assessed  SENSATION: WFL  POSTURE: rounded shoulders, forward head, and reverse S scoliosis c R rib  hump  PALPATION: TTP c increased muscle tension of the R interscapular and paraspinal muscles, and r upper trap and levator   CERVICAL ROM:  WNLs Active ROM A/PROM (deg) eval  Flexion   Extension   Right lateral flexion   Left lateral flexion Puling R cervical and mid back  Right rotation   Left rotation Puling R cervical and mid back   (Blank rows = not tested)  UPPER EXTREMITY ROM:  WNLs Active ROM Right eval Left eval  Shoulder flexion    Shoulder extension    Shoulder abduction    Shoulder adduction    Shoulder extension  Shoulder internal rotation    Shoulder external rotation    Elbow flexion    Elbow extension    Wrist flexion    Wrist extension    Wrist ulnar deviation    Wrist radial deviation    Wrist pronation    Wrist supination     (Blank rows = not tested)  UPPER EXTREMITY MMT:  MMT Right eval Left eval  Shoulder flexion 4+ pain 5  Shoulder extension    Shoulder abduction    Shoulder adduction    Shoulder extension    Shoulder internal rotation    Shoulder external rotation    Middle trapezius 4 pain 5  Lower trapezius    Elbow flexion    Elbow extension    Wrist flexion    Wrist extension    Wrist ulnar deviation    Wrist radial deviation    Wrist pronation    Wrist supination    Grip strength 40 seemingly due to CVA 80   (Blank rows = not tested)  CERVICAL SPECIAL TESTS:  Spurling's test: Negative   TREATMENT DATE:  [facility] Adult PT Treatment:                                                DATE: 08/21/24 Manual Therapy: STM c TPR to the R interscapular paraspinals, rhomboids, and mid trap   Therapeutic Exercise: UBE L1 1.5 mins in each direction Cat Cow  x8 5 Quadruped Thoracic Rotation - Reach Under x5 20 Child's Pose Stretch  5 reps - 20 hold Standing shoulder hor abd star pattern x5 Standing Shoulder Row GTB x15 Shoulder Extension GTB x15 Open book x5 5 Modalities: MH to the R mid back and upper shoudler  [facility]  Adult PT Treatment:                                                DATE: 08/19/24 Manual Therapy: STM c TPR to the R interscapular paraspinals, rhomboids, and mid trap   Therapeutic Exercise: Standing Quadratus Lumborum Stretch with Doorway  x1 30 each Cat Cow  x8 5 Quadruped Thoracic Rotation - Reach Under x5 20 Child's Pose Stretch  5 reps - 20 hold Standing Shoulder Row GTB 2x12 Shoulder Extension GTB 2x12 Updated HEP  [facility] Adult PT Treatment:                                                DATE: 07/31/24 Therapeutic Exercise: QL stretch Self Care: Tennis ball on wall massage f/b heating pad or cold pack to address taut muscles  and pain  PATIENT EDUCATION:  Education details: Eval findings, POC, HEP, self care Person educated: Patient Education method: Explanation, Demonstration, Tactile cues, Verbal cues, and Handouts Education comprehension: verbalized understanding, returned demonstration, verbal cues required, and tactile cues required  HOME EXERCISE PROGRAM: Access Code: [email or web address] URL: [email or web address] Date: 08/21/2024 Prepared by: Dasie Daft  Exercises - Standing Quadratus Lumborum Stretch with Doorway  - 1-2 x daily - 7 x weekly - 1 sets - 3 reps - 30 hold - Cat Cow  - 1-2 x daily - 7 x weekly - 1 sets - 10 reps - 5 hold - Quadruped Thoracic Rotation - Reach Under  - 1-2 x daily - 7 x weekly - 1 sets - 3-5 reps - 20 hold - Child's Pose Stretch  - 1-2 x daily - 7 x weekly - 1 sets - 3-5 reps - 20 hold - Standing Shoulder Row with Anchored Resistance  - 1 x daily - 7 x weekly - 2-3 sets - 10 reps - 3 hold - Shoulder Extension with Resistance  - 1 x daily - 7 x weekly - 2-3 sets - 10 reps - 3 hold - Standing Shoulder Horizontal Abduction with Resistance  - 1 x daily - 7 x weekly - 2-3 sets - 5 reps - 3 hold - Standing Thoracic  Open Book at Wall  - 1 x daily - 7 x weekly - 1 sets - 10 reps - 5 hold  ASSESSMENT:  CLINICAL IMPRESSION: Continued STM c TPR was provided as documented above. Pt notes R interscapular muscles were less tender. Exs were then completed for thoracic mobility and posterior chain strengthening. MH was applied at the EOS. Pt tolerated prescribed exs s adverse effects. Pt experienced some relief after the last PT visit. Pt will continue to benefit from skilled PT to address impairments for improved back function with minimized pain.   EVAL: Patient is a 39 y.o. female who was seen today for physical therapy evaluation and treatment for  M54.6 (ICD-10-CM) - Acute right-sided thoracic back pain  M79.18 (ICD-10-CM) - Myofascial pain syndrome  M41.9 (ICD-10-CM) - Scoliosis of thoracolumbar spine, unspecified scoliosis type  Pt presents to PT with upper/mid back pain which seems related to the reverse S scoliosis c r rib hump. Additionally, the R UE is weaker than the L seemly due to a CVA in 2017 affecting her R side.  OBJECTIVE IMPAIRMENTS: decreased strength, increased muscle spasms, impaired UE functional use, postural dysfunction, and pain.   ACTIVITY LIMITATIONS: carrying, lifting, bathing, dressing, reach over head, and caring for others  PARTICIPATION LIMITATIONS: meal prep, cleaning, and laundry  PERSONAL FACTORS: Past/current experiences and Time since onset of injury/illness/exacerbation are also affecting patient's functional outcome.   REHAB POTENTIAL: Good  CLINICAL DECISION MAKING: Evolving/moderate complexity  EVALUATION COMPLEXITY: Moderate   GOALS:   SHORT TERM GOALS: Target date: 08/21/24  Pt will be Ind in an initial HEP  Baseline: started Goal status: ONGOING  2.  Pt will report 25% or greater improvement in R upper/mid back with daily activities Baseline: 2-10/10 Goal status: ONGOING   LONG TERM GOALS: Target date: 09/18/24  Pt will be Ind in a final HEP to  maintain achieved LOF  Baseline: satrted Goal status: INITIAL  2.  Pt will report 75% or greater improvement in R upper/mid back with daily activities for improved function and QOL Baseline:  Goal status: INITIAL  3.  Pt will be able to lift 5# 10x to overhead self c only min increase in  pain as demonstration of improved function of the R UE for household activities Baseline: TBA Goal status: INITIAL  4.  Pt's Mod oswestry score will improve by the MCID to 27% of better as indication of improved function  Baseline: 40% Goal status: INITIAL   PLAN:  PT FREQUENCY: 2x/week  PT DURATION: 6 weeks  PLANNED INTERVENTIONS: 97164- PT Re-evaluation, 97110-Therapeutic exercises, 97530- Therapeutic activity, [id number]- Neuromuscular re-education, 97535- Self Care, 02859- Manual therapy, [id number]- Gait training, [phone]- Electrical stimulation (unattended), 20560 (1-2 muscles), 20561 (3+ muscles)- Dry Needling, Patient/Family education, Taping, Joint mobilization, Spinal mobilization, Cryotherapy, and Moist heat  PLAN FOR NEXT SESSION: Assess UE lifting to overhead self; assess response to HEP; progress therex as indicated; use of modalities, manual therapy; and TPDN as indicated.  Alizae Bechtel MS, PT 08/21/2024 11:14 AM      "

## 2024-08-21 ENCOUNTER — Ambulatory Visit

## 2024-08-21 DIAGNOSIS — R293 Abnormal posture: Secondary | ICD-10-CM

## 2024-08-21 DIAGNOSIS — M6281 Muscle weakness (generalized): Secondary | ICD-10-CM

## 2024-08-21 DIAGNOSIS — M62838 Other muscle spasm: Secondary | ICD-10-CM

## 2024-08-24 NOTE — Telephone Encounter (Signed)
 Good morning,   Can you help me out with this?  Her referral for endo has no contact information for the endo clinic

## 2024-08-25 NOTE — Therapy (Signed)
 " OUTPATIENT PHYSICAL THERAPY CERVICAL TREATMENT   Patient Name: Janice Stewart MRN: [id number] DOB:May 06, 1986, 39 y.o., female Today's Date: 07/29/2024  END OF SESSION:        Past Medical History:  Diagnosis Date   Anemia    Bone infarct (HCC)    S1 vertebrae   Diastasis recti 06/2023   Essential hypertension    Gestational choriocarcinoma (HCC) 2017   Headache    Left inguinal hernia 05/2023   Sickle cell trait    Stroke (HCC) 2017   around the time of chemotherapy   Umbilical hernia 05/2023   Past Surgical History:  Procedure Laterality Date   INSERTION OF MESH  07/25/2023   Procedure: INSERTION OF MESH;  Surgeon: Desiderio Schanz, MD;  Location: [facility] ORS;  Service: General;;   LIVER BIOPSY  2017   LUNG BIOPSY  2017   PORT-A-CATH REMOVAL  2018   PORTA CATH INSERTION  2017   ROBOT ASSISTED INGUINAL HERNIA REPAIR Left 06/20/2023   AND Creation of Left Posterior Rectus-Transversalis Fascia Advancment Flap for Coverage of Pelvic Wound (200 cm)   Patient Active Problem List   Diagnosis Date Noted   Diastasis recti 07/25/2023   Umbilical hernia without obstruction and without gangrene 07/25/2023   Left inguinal hernia 06/20/2023   Low back pain 03/26/2022   Bone infarct (HCC) 02/22/2022   Simple obesity 09/22/2021   Sickle cell trait 02/22/2020   Gestational choriocarcinoma (HCC) in 2017 01/25/2020   History of CVA (cerebrovascular accident) 01/25/2020    PCP: Jaycee Greig PARAS, NP   REFERRING PROVIDER: Trudy Duwaine BRAVO, NP   REFERRING DIAG:  M54.6 (ICD-10-CM) - Acute right-sided thoracic back pain  M79.18 (ICD-10-CM) - Myofascial pain syndrome  M41.9 (ICD-10-CM) - Scoliosis of thoracolumbar spine, unspecified scoliosis type    THERAPY DIAG:  No diagnosis found.  Rationale for Evaluation and Treatment: Rehabilitation  ONSET DATE: Chronic since 2017 with increase in pain the last 2-3 months  SUBJECTIVE:                                                                                                                                                                                                          SUBJECTIVE STATEMENT: R upper back pain is the same felt better that day after the ;last PT session.  EVAL:Pt reports chronic R upper/mid back pain since 2017 with increase in pain the last 2-3 months.  Hand dominance: Right  PERTINENT HISTORY:  [place] 2017; [place] 2017-affecting R side as per pt  PAIN:  Are you having pain? Yes: NPRS  scale: Pain range: 2-10/10. Current: 5/10 Pain location: R upper/mid back Pain description: Sharp, N/T in area  Aggravating factors: prolonged standing, physical and mental stress, increased use of R arm Relieving factors: Pain medications, lidocaine  patch  PRECAUTIONS: None  RED FLAGS: None     WEIGHT BEARING RESTRICTIONS: No  FALLS:  Has patient fallen in last 6 months? No  LIVING ENVIRONMENT: Lives with: lives with their family Lives in: House/apartment Able to access home  OCCUPATION: Home maker  PLOF: Independent  PATIENT GOALS: Less pain and to be more active  OBJECTIVE:  Note: Objective measures were completed at Evaluation unless otherwise noted.  DIAGNOSTIC FINDINGS:  IMPRESSION: 1. No acute intracranial abnormality. 2. C5-C6 small central disc protrusion without central spinal canal or neural foraminal stenosis. 3. Incidental 1.6 cm left thyroid  nodule; dedicated outpatient thyroid  ultrasound recommended.  AP and lateral radiographs of thoracic spine show rightward curvature.  Normal thoracic kyphosis. Well preserved disc spacing. No fractures or  dislocations.   MRI 07/29/24 IMPRESSION: 1. No acute findings in the thoracic spine. 2. Rightward curvature of the mid thoracic spine centered at T7. 3. 2.3 cm left thyroid  nodule, for which non-emergent thyroid  ultrasound is recommended.   PATIENT SURVEYS:  Modified Oswestry: 20/50=40%  Interpretation of scores: Score Category  Description  0-20% Minimal Disability The patient can cope with most living activities. Usually no treatment is indicated apart from advice on lifting, sitting and exercise  21-40% Moderate Disability The patient experiences more pain and difficulty with sitting, lifting and standing. Travel and social life are more difficult and they may be disabled from work. Personal care, sexual activity and sleeping are not grossly affected, and the patient can usually be managed by conservative means  41-60% Severe Disability Pain remains the main problem in this group, but activities of daily living are affected. These patients require a detailed investigation  61-80% Crippled Back pain impinges on all aspects of the patients life. Positive intervention is required  81-100% Bed-bound These patients are either bed-bound or exaggerating their symptoms  Bluford FORBES Zoe DELENA Karon DELENA, et al. Surgery versus conservative management of stable thoracolumbar fracture: the PRESTO feasibility RCT. [place] ([place]): [employer]; 2021 Nov. [facility] Technology Assessment, No. 25.62.) Appendix 3, Oswestry Disability Index category descriptors. Available from: Findjewelers.cz  Minimally Clinically Important Difference (MCID) = 12.8%  COGNITION: Overall cognitive status: Within functional limits for tasks assessed  SENSATION: WFL  POSTURE: rounded shoulders, forward head, and reverse S scoliosis c R rib hump  PALPATION: TTP c increased muscle tension of the R interscapular and paraspinal muscles, and r upper trap and levator   CERVICAL ROM:  WNLs Active ROM A/PROM (deg) eval  Flexion   Extension   Right lateral flexion   Left lateral flexion [place] R cervical and mid back  Right rotation   Left rotation [place] R cervical and mid back   (Blank rows = not tested)  UPPER EXTREMITY ROM:  WNLs Active ROM Right eval Left eval  Shoulder flexion    Shoulder extension     Shoulder abduction    Shoulder adduction    Shoulder extension    Shoulder internal rotation    Shoulder external rotation    Elbow flexion    Elbow extension    Wrist flexion    Wrist extension    Wrist ulnar deviation    Wrist radial deviation    Wrist pronation    Wrist supination     (Blank rows = not  tested)  UPPER EXTREMITY MMT:  MMT Right eval Left eval  Shoulder flexion 4+ pain 5  Shoulder extension    Shoulder abduction    Shoulder adduction    Shoulder extension    Shoulder internal rotation    Shoulder external rotation    Middle trapezius 4 pain 5  Lower trapezius    Elbow flexion    Elbow extension    Wrist flexion    Wrist extension    Wrist ulnar deviation    Wrist radial deviation    Wrist pronation    Wrist supination    Grip strength 40 seemingly due to CVA 80   (Blank rows = not tested)  CERVICAL SPECIAL TESTS:  Spurling's test: Negative   TREATMENT DATE:  [facility] Adult PT Treatment:                                                DATE: 08/26/24 Manual Therapy: STM c TPR to the R interscapular paraspinals, rhomboids, and mid trap   Therapeutic Exercise: UBE L1 1.5 mins in each direction Cat Cow  x8 5 Quadruped Thoracic Rotation - Reach Under x5 20 Child's Pose Stretch  5 reps - 20 hold Standing shoulder hor abd star pattern x5 Standing Shoulder Row GTB x15 Shoulder Extension GTB x15 Open book x5 5 Modalities: MH to the R mid back and upper shoudlerTherapeutic Exercise: *** Manual Therapy: *** Neuromuscular re-ed: *** Therapeutic Activity: *** Modalities: *** Self Care: ***  [facility] Adult PT Treatment:                                                DATE: 08/21/24 Manual Therapy: STM c TPR to the R interscapular paraspinals, rhomboids, and mid trap   Therapeutic Exercise: UBE L1 1.5 mins in each direction Cat Cow  x8 5 Quadruped Thoracic Rotation - Reach Under x5 20 Child's Pose Stretch  5 reps - 20 hold Standing shoulder hor  abd star pattern x5 Standing Shoulder Row GTB x15 Shoulder Extension GTB x15 Open book x5 5 Modalities: MH to the R mid back and upper shoudler  [facility] Adult PT Treatment:                                                DATE: 08/19/24 Manual Therapy: STM c TPR to the R interscapular paraspinals, rhomboids, and mid trap   Therapeutic Exercise: Standing Quadratus Lumborum Stretch with Doorway  x1 30 each Cat Cow  x8 5 Quadruped Thoracic Rotation - Reach Under x5 20 Child's Pose Stretch  5 reps - 20 hold Standing Shoulder Row GTB 2x12 Shoulder Extension GTB 2x12 Updated HEP  [facility] Adult PT Treatment:                                                DATE: 07/31/24 Therapeutic Exercise: QL stretch Self Care: Tennis ball on wall massage f/b heating pad or cold pack to address taut muscles  and pain                                                                                                                                 PATIENT EDUCATION:  Education details: Eval findings, POC, HEP, self care Person educated: Patient Education method: Explanation, Demonstration, Tactile cues, Verbal cues, and Handouts Education comprehension: verbalized understanding, returned demonstration, verbal cues required, and tactile cues required  HOME EXERCISE PROGRAM: Access Code: [email or web address] URL: [email or web address] Date: 08/21/2024 Prepared by: Dasie Daft  Exercises - Standing Quadratus Lumborum Stretch with Doorway  - 1-2 x daily - 7 x weekly - 1 sets - 3 reps - 30 hold - Cat Cow  - 1-2 x daily - 7 x weekly - 1 sets - 10 reps - 5 hold - Quadruped Thoracic Rotation - Reach Under  - 1-2 x daily - 7 x weekly - 1 sets - 3-5 reps - 20 hold - Child's Pose Stretch  - 1-2 x daily - 7 x weekly - 1 sets - 3-5 reps - 20 hold - Standing Shoulder Row with Anchored Resistance  - 1 x daily - 7 x weekly - 2-3 sets - 10 reps - 3 hold - Shoulder Extension with Resistance  - 1 x daily - 7 x weekly - 2-3  sets - 10 reps - 3 hold - Standing Shoulder Horizontal Abduction with Resistance  - 1 x daily - 7 x weekly - 2-3 sets - 5 reps - 3 hold - Standing Thoracic Open Book at Wall  - 1 x daily - 7 x weekly - 1 sets - 10 reps - 5 hold  ASSESSMENT:  CLINICAL IMPRESSION: Continued STM c TPR was provided as documented above. Pt notes R interscapular muscles were less tender. Exs were then completed for thoracic mobility and posterior chain strengthening. MH was applied at the EOS. Pt tolerated prescribed exs s adverse effects. Pt experienced some relief after the last PT visit. Pt will continue to benefit from skilled PT to address impairments for improved back function with minimized pain.   EVAL: Patient is a 39 y.o. female who was seen today for physical therapy evaluation and treatment for  M54.6 (ICD-10-CM) - Acute right-sided thoracic back pain  M79.18 (ICD-10-CM) - Myofascial pain syndrome  M41.9 (ICD-10-CM) - Scoliosis of thoracolumbar spine, unspecified scoliosis type  Pt presents to PT with upper/mid back pain which seems related to the reverse S scoliosis c r rib hump. Additionally, the R UE is weaker than the L seemly due to a CVA in 2017 affecting her R side.  OBJECTIVE IMPAIRMENTS: decreased strength, increased muscle spasms, impaired UE functional use, postural dysfunction, and pain.   ACTIVITY LIMITATIONS: carrying, lifting, bathing, dressing, reach over head, and caring for others  PARTICIPATION LIMITATIONS: meal prep, cleaning, and laundry  PERSONAL FACTORS: Past/current experiences and Time since onset of injury/illness/exacerbation are also affecting patient's functional outcome.   REHAB POTENTIAL: Good  CLINICAL  DECISION MAKING: Evolving/moderate complexity  EVALUATION COMPLEXITY: Moderate   GOALS:   SHORT TERM GOALS: Target date: 08/21/24  Pt will be Ind in an initial HEP  Baseline: started Goal status: ONGOING  2.  Pt will report 25% or greater improvement in R  upper/mid back with daily activities Baseline: 2-10/10 Goal status: ONGOING   LONG TERM GOALS: Target date: 09/18/24  Pt will be Ind in a final HEP to maintain achieved LOF  Baseline: satrted Goal status: INITIAL  2.  Pt will report 75% or greater improvement in R upper/mid back with daily activities for improved function and QOL Baseline:  Goal status: INITIAL  3.  Pt will be able to lift 5# 10x to overhead self c only min increase in pain as demonstration of improved function of the R UE for household activities Baseline: TBA Goal status: INITIAL  4.  Pt's Mod oswestry score will improve by the MCID to 27% of better as indication of improved function  Baseline: 40% Goal status: INITIAL   PLAN:  PT FREQUENCY: 2x/week  PT DURATION: 6 weeks  PLANNED INTERVENTIONS: 97164- PT Re-evaluation, 97110-Therapeutic exercises, 97530- Therapeutic activity, [id number]- Neuromuscular re-education, 97535- Self Care, [id number]- Manual therapy, [id number]- Gait training, [phone]- Electrical stimulation (unattended), 20560 (1-2 muscles), 20561 (3+ muscles)- Dry Needling, Patient/Family education, Taping, Joint mobilization, Spinal mobilization, Cryotherapy, and Moist heat  PLAN FOR NEXT SESSION: Assess UE lifting to overhead self; assess response to HEP; progress therex as indicated; use of modalities, manual therapy; and TPDN as indicated.  Breanna Mcdaniel MS, PT 08/25/2024 9:56 AM      "

## 2024-08-26 ENCOUNTER — Ambulatory Visit

## 2024-08-26 DIAGNOSIS — M62838 Other muscle spasm: Secondary | ICD-10-CM

## 2024-08-26 DIAGNOSIS — R293 Abnormal posture: Secondary | ICD-10-CM

## 2024-08-26 DIAGNOSIS — M6281 Muscle weakness (generalized): Secondary | ICD-10-CM

## 2024-08-27 ENCOUNTER — Ambulatory Visit: Admission: EM | Admit: 2024-08-27 | Discharge: 2024-08-27 | Disposition: A | Discharge status: Home / Self Care

## 2024-08-27 ENCOUNTER — Encounter: Payer: Self-pay | Admitting: *Deleted

## 2024-08-27 DIAGNOSIS — M542 Cervicalgia: Secondary | ICD-10-CM

## 2024-08-27 MED ORDER — KETOROLAC TROMETHAMINE 30 MG/ML IJ SOLN: 30.0000 mg | Freq: Once | INTRAMUSCULAR | Status: DC

## 2024-08-27 MED ORDER — CYCLOBENZAPRINE HCL 5 MG PO TABS: 5.0000 mg | ORAL_TABLET | Freq: Three times a day (TID) | ORAL | 0 refills | Status: AC | PRN

## 2024-08-27 MED ORDER — DICLOFENAC SODIUM 75 MG PO TBEC: 75.0000 mg | DELAYED_RELEASE_TABLET | Freq: Two times a day (BID) | ORAL | 0 refills | Status: AC

## 2024-08-27 MED ORDER — KETOROLAC TROMETHAMINE 30 MG/ML IJ SOLN
30.0000 mg | Freq: Once | INTRAMUSCULAR | Status: AC
  Administered 2024-08-27: 30 mg via INTRAMUSCULAR

## 2024-08-27 MED ORDER — DEXAMETHASONE SOD PHOSPHATE PF 10 MG/ML IJ SOLN
10.0000 mg | Freq: Once | INTRAMUSCULAR | Status: AC
  Administered 2024-08-27: 10 mg via INTRAMUSCULAR

## 2024-08-27 NOTE — ED Provider Notes (Signed)
 " [facility]    CSN: [id number] Arrival date & time: 08/27/24  1019      History   Chief Complaint Chief Complaint  Patient presents with   Neck Pain    HPI Janice Stewart is a 39 y.o. female.   Pt presents today due to 2 days worth of neck pain after putting on her pants. Pt states that she has had this pain in her neck many times before. Pt was given an MRI and was found to have a rightward curvature of her thoracic spine and the level of centered at T7, and small disc protrusion at C5-C6. Pt states that she has tried to use ibuprofen  for pain without relief. Pt denies radiation down arms or numbness or tingling in hands. Pt does admit that pain radiates to right shoulder.   The history is provided by the patient.  Neck Pain   Past Medical History:  Diagnosis Date   Anemia    Bone infarct (HCC)    S1 vertebrae   Diastasis recti 06/2023   Essential hypertension    Gestational choriocarcinoma (HCC) 2017   Headache    Left inguinal hernia 05/2023   Sickle cell trait    Stroke (HCC) 2017   around the time of chemotherapy   Umbilical hernia 05/2023    Patient Active Problem List   Diagnosis Date Noted   Diastasis recti 07/25/2023   Umbilical hernia without obstruction and without gangrene 07/25/2023   Left inguinal hernia 06/20/2023   Low back pain 03/26/2022   Bone infarct (HCC) 02/22/2022   Simple obesity 09/22/2021   Sickle cell trait 02/22/2020   Gestational choriocarcinoma (HCC) in 2017 01/25/2020   History of CVA (cerebrovascular accident) 01/25/2020    Past Surgical History:  Procedure Laterality Date   INSERTION OF MESH  07/25/2023   Procedure: INSERTION OF MESH;  Surgeon: Desiderio Schanz, MD;  Location: [facility] ORS;  Service: General;;   LIVER BIOPSY  2017   LUNG BIOPSY  2017   PORT-A-CATH REMOVAL  2018   PORTA CATH INSERTION  2017   ROBOT ASSISTED INGUINAL HERNIA REPAIR Left 06/20/2023   AND Creation of Left Posterior Rectus-Transversalis  Fascia Advancment Flap for Coverage of Pelvic Wound (200 cm)    OB History     Gravida  4   Para  2   Term  2   Preterm      AB  1   Living  2      SAB  1   IAB      Ectopic      Multiple  0   Live Births  2            Home Medications    Prior to Admission medications  Medication Sig Start Date End Date Taking? Authorizing Provider  acetaminophen  (TYLENOL ) 500 MG tablet Take 2 tablets (1,000 mg total) by mouth every 6 (six) hours as needed for mild pain (pain score 1-3). 07/25/23  Yes Piscoya, Schanz, MD  cyclobenzaprine  (FLEXERIL ) 5 MG tablet Take 1 tablet (5 mg total) by mouth 3 (three) times daily as needed. 08/27/24  Yes Andra Corean BROCKS, PA-C  diclofenac  (VOLTAREN ) 75 MG EC tablet Take 1 tablet (75 mg total) by mouth 2 (two) times daily. 08/27/24  Yes Andra Corean BROCKS, PA-C  Multiple Vitamin (MULTIVITAMIN WITH MINERALS) TABS tablet Take 1 tablet by mouth in the morning.   Yes [provider]  paragard  intrauterine copper  IUD IUD 1 each by  Intrauterine route once.   Yes [provider]  topiramate  (TOPAMAX ) 25 MG tablet Take 1 tablet (25 mg total) by mouth at bedtime. 07/01/24  Yes Jaffe, Adam R, DO  Ubrogepant (UBRELVY) 100 MG TABS Take 1 tablet (100 mg total) by mouth as needed. May repeat after 2 hours.  Maximum 2 tablets in 24 hours. 07/30/24  Yes Jaffe, Adam R, DO  valsartan  (DIOVAN ) 80 MG tablet Take 1 tablet (80 mg total) by mouth daily. 01/29/24  Yes Massey, Amy J, NP  SUMAtriptan  (IMITREX ) 25 MG tablet Take 25 mg (1 tablet total) by mouth at the start of the headache. May repeat in 2 hours x 1 if headache persists. Max of 2 tablets/24 hours. Patient not taking: Reported on 07/01/2024 01/29/24   Jaycee Greig PARAS, NP    Family History Family History  Problem Relation Age of Onset   Migraines Mother    Varicose Veins Mother    Hypertension Father    Cancer Father        prostate    Social History Social  History[1]   Allergies   Patient has no known allergies.   Review of Systems Review of Systems  Musculoskeletal:  Positive for neck pain.     Physical Exam Triage Vital Signs ED Triage Vitals  Encounter Vitals Group     BP 08/27/24 1143 (!) 140/88     Girls Systolic BP Percentile --      Girls Diastolic BP Percentile --      Boys Systolic BP Percentile --      Boys Diastolic BP Percentile --      Pulse Rate 08/27/24 1143 64     Resp 08/27/24 1143 16     Temp 08/27/24 1143 97.8 F (36.6 C)     Temp Source 08/27/24 1143 Oral     SpO2 08/27/24 1143 99 %     Weight --      Height --      Head Circumference --      Peak Flow --      Pain Score 08/27/24 1140 10     Pain Loc --      Pain Education --      Exclude from Growth Chart --    No data found.  Updated Vital Signs BP (!) 140/88 (BP Location: Left Arm)   Pulse 64   Temp 97.8 F (36.6 C) (Oral)   Resp 16   LMP 08/06/2024 (Approximate)   SpO2 99%   Visual Acuity Right Eye Distance:   Left Eye Distance:   Bilateral Distance:    Right Eye Near:   Left Eye Near:    Bilateral Near:     Physical Exam Vitals and nursing note reviewed.  Constitutional:      General: She is not in acute distress.    Appearance: Normal appearance. She is not ill-appearing, toxic-appearing or diaphoretic.  Eyes:     General: No scleral icterus. Neck:     Comments: Reduced flexion, extension, right and left lateral rotation Cardiovascular:     Rate and Rhythm: Normal rate and regular rhythm.     Heart sounds: Normal heart sounds.  Pulmonary:     Effort: Pulmonary effort is normal. No respiratory distress.     Breath sounds: Normal breath sounds. No wheezing or rhonchi.  Musculoskeletal:     Cervical back: Pain with movement, spinous process tenderness and muscular tenderness present. Decreased range of motion.  Skin:    General: Skin  is warm.  Neurological:     Mental Status: She is alert and oriented to person, place,  and time.  Psychiatric:        Mood and Affect: Mood normal.        Behavior: Behavior normal.      UC Treatments / Results  Labs (all labs ordered are listed, but only abnormal results are displayed) Labs Reviewed - No data to display  EKG   Radiology No results found.  Procedures Procedures (including critical care time)  Medications Ordered in UC Medications  dexamethasone  (DECADRON ) injection 10 mg (10 mg Intramuscular Given 08/27/24 1231)  ketorolac  (TORADOL ) 30 MG/ML injection 30 mg (30 mg Intramuscular Given 08/27/24 1236)    Initial Impression / Assessment and Plan / UC Course  I have reviewed the triage vital signs and the nursing notes.  Pertinent labs & imaging results that were available during my care of the patient were reviewed by me and considered in my medical decision making (see chart for details).     Pain relief with in office medications Final Clinical Impressions(s) / UC Diagnoses   Final diagnoses:  Neck pain, acute     Discharge Instructions      You have been prescribed diclofenac  and flexeril  for your neck pain. While taking these meds you should not take any other ibuprofen , aspirin , or naproxen. Muscle relaxer may make you sleepy so do not take when you have to work or while operating heavy machinery.     ED Prescriptions     Medication Sig Dispense Auth. Provider   cyclobenzaprine  (FLEXERIL ) 5 MG tablet Take 1 tablet (5 mg total) by mouth 3 (three) times daily as needed. 30 tablet Andra Corean BROCKS, PA-C   diclofenac  (VOLTAREN ) 75 MG EC tablet Take 1 tablet (75 mg total) by mouth 2 (two) times daily. 30 tablet Andra Corean BROCKS, PA-C      PDMP not reviewed this encounter.    [1]  Social History Tobacco Use   Smoking status: Never    Passive exposure: Never   Smokeless tobacco: Never  Vaping Use   Vaping status: Never Used  Substance Use Topics   Alcohol use: Not Currently   Drug use: Never     Andra Corean BROCKS, PA-C 08/27/24 1324  "

## 2024-08-27 NOTE — Discharge Instructions (Signed)
 You have been prescribed diclofenac  and flexeril  for your neck pain. While taking these meds you should not take any other ibuprofen , aspirin , or naproxen. Muscle relaxer may make you sleepy so do not take when you have to work or while operating heavy machinery.

## 2024-08-27 NOTE — ED Triage Notes (Signed)
 Pt reports she is having neck pain x 2 days while putting on her pants. She has a hx of neck problems and has had an MRI. She has had this pain before. She has tried motrin  without relief

## 2024-09-02 ENCOUNTER — Ambulatory Visit

## 2024-09-03 NOTE — Therapy (Signed)
 " OUTPATIENT PHYSICAL THERAPY CERVICAL TREATMENT   Patient Name: Janice Stewart MRN: [id number] DOB:03-07-1986, 39 y.o., female Today's Date: 07/29/2024  END OF SESSION:         Past Medical History:  Diagnosis Date   Anemia    Bone infarct (HCC)    S1 vertebrae   Diastasis recti 06/2023   Essential hypertension    Gestational choriocarcinoma (HCC) 2017   Headache    Left inguinal hernia 05/2023   Sickle cell trait    Stroke (HCC) 2017   around the time of chemotherapy   Umbilical hernia 05/2023   Past Surgical History:  Procedure Laterality Date   INSERTION OF MESH  07/25/2023   Procedure: INSERTION OF MESH;  Surgeon: Desiderio Schanz, MD;  Location: [facility] ORS;  Service: General;;   LIVER BIOPSY  2017   LUNG BIOPSY  2017   PORT-A-CATH REMOVAL  2018   PORTA CATH INSERTION  2017   ROBOT ASSISTED INGUINAL HERNIA REPAIR Left 06/20/2023   AND Creation of Left Posterior Rectus-Transversalis Fascia Advancment Flap for Coverage of Pelvic Wound (200 cm)   Patient Active Problem List   Diagnosis Date Noted   Diastasis recti 07/25/2023   Umbilical hernia without obstruction and without gangrene 07/25/2023   Left inguinal hernia 06/20/2023   Low back pain 03/26/2022   Bone infarct (HCC) 02/22/2022   Simple obesity 09/22/2021   Sickle cell trait 02/22/2020   Gestational choriocarcinoma (HCC) in 2017 01/25/2020   History of CVA (cerebrovascular accident) 01/25/2020    PCP: Jaycee Greig PARAS, NP   REFERRING PROVIDER: Trudy Duwaine BRAVO, NP   REFERRING DIAG:  M54.6 (ICD-10-CM) - Acute right-sided thoracic back pain  M79.18 (ICD-10-CM) - Myofascial pain syndrome  M41.9 (ICD-10-CM) - Scoliosis of thoracolumbar spine, unspecified scoliosis type    THERAPY DIAG:  No diagnosis found.  Rationale for Evaluation and Treatment: Rehabilitation  ONSET DATE: Chronic since 2017 with increase in pain the last 2-3 months  SUBJECTIVE:                                                                                                                                                                                                          SUBJECTIVE STATEMENT: Pt reports she was pulling up her pants yesterday and she experienced R neck pain and stiffness which is very severe. When this occurs she usually goes to an urgent care and she is prescribed a muscle relaxer.  EVAL:Pt reports chronic R upper/mid back pain since 2017 with increase in pain the last 2-3 months.  Hand dominance:  Right  PERTINENT HISTORY:  [place] 2017; [place] 2017-affecting R side as per pt  PAIN:  Are you having pain? Yes: NPRS scale: Pain range: 2-10/10. Current: 10/10 Pain location: R upper/mid back Pain description: Sharp, N/T in area  Aggravating factors: prolonged standing, physical and mental stress, increased use of R arm Relieving factors: Pain medications, lidocaine  patch  PRECAUTIONS: None  RED FLAGS: None     WEIGHT BEARING RESTRICTIONS: No  FALLS:  Has patient fallen in last 6 months? No  LIVING ENVIRONMENT: Lives with: lives with their family Lives in: House/apartment Able to access home  OCCUPATION: Home maker  PLOF: Independent  PATIENT GOALS: Less pain and to be more active  OBJECTIVE:  Note: Objective measures were completed at Evaluation unless otherwise noted.  DIAGNOSTIC FINDINGS:  IMPRESSION: 1. No acute intracranial abnormality. 2. C5-C6 small central disc protrusion without central spinal canal or neural foraminal stenosis. 3. Incidental 1.6 cm left thyroid  nodule; dedicated outpatient thyroid  ultrasound recommended.  AP and lateral radiographs of thoracic spine show rightward curvature.  Normal thoracic kyphosis. Well preserved disc spacing. No fractures or  dislocations.   MRI 07/29/24 IMPRESSION: 1. No acute findings in the thoracic spine. 2. Rightward curvature of the mid thoracic spine centered at T7. 3. 2.3 cm left thyroid  nodule, for which non-emergent  thyroid  ultrasound is recommended.   PATIENT SURVEYS:  Modified Oswestry: 20/50=40%  Interpretation of scores: Score Category Description  0-20% Minimal Disability The patient can cope with most living activities. Usually no treatment is indicated apart from advice on lifting, sitting and exercise  21-40% Moderate Disability The patient experiences more pain and difficulty with sitting, lifting and standing. Travel and social life are more difficult and they may be disabled from work. Personal care, sexual activity and sleeping are not grossly affected, and the patient can usually be managed by conservative means  41-60% Severe Disability Pain remains the main problem in this group, but activities of daily living are affected. These patients require a detailed investigation  61-80% Crippled Back pain impinges on all aspects of the patients life. Positive intervention is required  81-100% Bed-bound These patients are either bed-bound or exaggerating their symptoms  Bluford FORBES Zoe DELENA Karon DELENA, et al. Surgery versus conservative management of stable thoracolumbar fracture: the PRESTO feasibility RCT. [place] ([place]): [employer]; 2021 Nov. [facility] Technology Assessment, No. 25.62.) Appendix 3, Oswestry Disability Index category descriptors. Available from: Findjewelers.cz  Minimally Clinically Important Difference (MCID) = 12.8%  COGNITION: Overall cognitive status: Within functional limits for tasks assessed  SENSATION: WFL  POSTURE: rounded shoulders, forward head, and reverse S scoliosis c R rib hump  PALPATION: TTP c increased muscle tension of the R interscapular and paraspinal muscles, and r upper trap and levator   CERVICAL ROM:  WNLs Active ROM A/PROM (deg) eval  Flexion   Extension   Right lateral flexion   Left lateral flexion [place] R cervical and mid back  Right rotation   Left rotation [place] R cervical and mid back    (Blank rows = not tested)  UPPER EXTREMITY ROM:  WNLs Active ROM Right eval Left eval  Shoulder flexion    Shoulder extension    Shoulder abduction    Shoulder adduction    Shoulder extension    Shoulder internal rotation    Shoulder external rotation    Elbow flexion    Elbow extension    Wrist flexion    Wrist extension    Wrist ulnar deviation  Wrist radial deviation    Wrist pronation    Wrist supination     (Blank rows = not tested)  UPPER EXTREMITY MMT:  MMT Right eval Left eval  Shoulder flexion 4+ pain 5  Shoulder extension    Shoulder abduction    Shoulder adduction    Shoulder extension    Shoulder internal rotation    Shoulder external rotation    Middle trapezius 4 pain 5  Lower trapezius    Elbow flexion    Elbow extension    Wrist flexion    Wrist extension    Wrist ulnar deviation    Wrist radial deviation    Wrist pronation    Wrist supination    Grip strength 40 seemingly due to CVA 80   (Blank rows = not tested)  CERVICAL SPECIAL TESTS:  Spurling's test: Negative   TREATMENT DATE:  [facility] Adult PT Treatment:                                                DATE: 09/04/24 Manual Therapy: STM the R interscapular paraspinals, rhomboids, and mid trap   Light pressure suboccipital release Attempted manual cervical traction but discontinued with pt fearful of increased pain Therapeutic Exercise: Supine small range chin tucks on pillow Seated small range chin tucks  Seated shoulder roll Modalities: MH to the R mid back and upper shoulder, and neck  Therapeutic Exercise: *** Manual Therapy: *** Neuromuscular re-ed: *** Therapeutic Activity: *** Modalities: *** Self Care: ***  [facility] Adult PT Treatment:                                                DATE: 08/26/24 Manual Therapy: STM the R interscapular paraspinals, rhomboids, and mid trap   Light pressure suboccipital release Attempted manual cervical traction but discontinued with  pt fearful of increased pain Therapeutic Exercise: Supine small range chin tucks on pillow Seated small range chin tucks  Seated shoulder roll Modalities: MH to the R mid back and upper shoulder, and neck   [facility] Adult PT Treatment:                                                DATE: 08/21/24 Manual Therapy: STM c TPR to the R interscapular paraspinals, rhomboids, and mid trap   Therapeutic Exercise: UBE L1 1.5 mins in each direction Cat Cow  x8 5 Quadruped Thoracic Rotation - Reach Under x5 20 Child's Pose Stretch  5 reps - 20 hold Standing shoulder hor abd star pattern x5 Standing Shoulder Row GTB x15 Shoulder Extension GTB x15 Open book x5 5 Modalities: MH to the R mid back and upper shoudler  PATIENT EDUCATION:  Education details: Eval findings, POC, HEP, self care Person educated: Patient Education method: Explanation, Demonstration, Tactile cues, Verbal cues, and Handouts Education comprehension: verbalized understanding, returned demonstration, verbal cues required, and tactile cues required  HOME EXERCISE PROGRAM: Access Code: [email or web address] URL: [email or web address] Date: 08/21/2024 Prepared by: Dasie Daft  Exercises - Standing Quadratus Lumborum Stretch with Doorway  - 1-2 x daily - 7 x weekly - 1 sets - 3 reps - 30 hold - Cat Cow  - 1-2 x daily - 7 x weekly - 1 sets - 10 reps - 5 hold - Quadruped Thoracic Rotation - Reach Under  - 1-2 x daily - 7 x weekly - 1 sets - 3-5 reps - 20 hold - Child's Pose Stretch  - 1-2 x daily - 7 x weekly - 1 sets - 3-5 reps - 20 hold - Standing Shoulder Row with Anchored Resistance  - 1 x daily - 7 x weekly - 2-3 sets - 10 reps - 3 hold - Shoulder Extension with Resistance  - 1 x daily - 7 x weekly - 2-3 sets - 10 reps - 3 hold - Standing Shoulder Horizontal Abduction with Resistance  - 1 x daily -  7 x weekly - 2-3 sets - 5 reps - 3 hold - Standing Thoracic Open Book at Wall  - 1 x daily - 7 x weekly - 1 sets - 10 reps - 5 hold  ASSESSMENT:  CLINICAL IMPRESSION: The pt has experienced a significant increase in neck pain with pulling her pants up yesterday. This appears to be a new issue which is acute in nature. Her AROM c rotation and SB to the R is significantly decreased. Pt was provided for STM to the R upper trap, mid trap, and rhomboid muscles. Light pressure suboccipital release was applied. Gentle manual traction was attempted, but pt was fearful it would be painful and the attempt was discontinued. Pt was able to complete small range chin tucks to gentle cervical mobility, and was encouraged to complete as tolerated at home. Pt reported feeling some better at end of session. Pt is to seek additional medical assistance if new issue persists.  EVAL: Patient is a 39 y.o. female who was seen today for physical therapy evaluation and treatment for  M54.6 (ICD-10-CM) - Acute right-sided thoracic back pain  M79.18 (ICD-10-CM) - Myofascial pain syndrome  M41.9 (ICD-10-CM) - Scoliosis of thoracolumbar spine, unspecified scoliosis type  Pt presents to PT with upper/mid back pain which seems related to the reverse S scoliosis c r rib hump. Additionally, the R UE is weaker than the L seemly due to a CVA in 2017 affecting her R side.  OBJECTIVE IMPAIRMENTS: decreased strength, increased muscle spasms, impaired UE functional use, postural dysfunction, and pain.   ACTIVITY LIMITATIONS: carrying, lifting, bathing, dressing, reach over head, and caring for others  PARTICIPATION LIMITATIONS: meal prep, cleaning, and laundry  PERSONAL FACTORS: Past/current experiences and Time since onset of injury/illness/exacerbation are also affecting patient's functional outcome.   REHAB POTENTIAL: Good  CLINICAL DECISION MAKING: Evolving/moderate complexity  EVALUATION COMPLEXITY:  Moderate   GOALS:   SHORT TERM GOALS: Target date: 08/21/24  Pt will be Ind in an initial HEP  Baseline: started Goal status: ONGOING  2.  Pt will report 25% or greater improvement in R upper/mid back with daily activities Baseline: 2-10/10 Goal status: ONGOING   LONG TERM GOALS: Target date: 09/18/24  Pt will be Ind in a final HEP to maintain achieved LOF  Baseline: satrted Goal status: INITIAL  2.  Pt will report 75% or greater improvement in R upper/mid back with daily activities for improved function and QOL Baseline:  Goal status: INITIAL  3.  Pt will be able to lift 5# 10x to overhead self c only min increase in pain as demonstration of improved function of the R UE for household activities Baseline: TBA Goal status: INITIAL  4.  Pt's Mod oswestry score will improve by the MCID to 27% of better as indication of improved function  Baseline: 40% Goal status: INITIAL   PLAN:  PT FREQUENCY: 2x/week  PT DURATION: 6 weeks  PLANNED INTERVENTIONS: 97164- PT Re-evaluation, 97110-Therapeutic exercises, 97530- Therapeutic activity, [id number]- Neuromuscular re-education, 97535- Self Care, [id number]- Manual therapy, [id number]- Gait training, [phone]- Electrical stimulation (unattended), 20560 (1-2 muscles), 20561 (3+ muscles)- Dry Needling, Patient/Family education, Taping, Joint mobilization, Spinal mobilization, Cryotherapy, and Moist heat  PLAN FOR NEXT SESSION: Assess UE lifting to overhead self; assess response to HEP; progress therex as indicated; use of modalities, manual therapy; and TPDN as indicated.  Joletta Manner MS, PT 09/03/24 7:51 AM      "

## 2024-09-04 ENCOUNTER — Ambulatory Visit

## 2024-09-04 DIAGNOSIS — R293 Abnormal posture: Secondary | ICD-10-CM

## 2024-09-04 DIAGNOSIS — M62838 Other muscle spasm: Secondary | ICD-10-CM

## 2024-09-04 DIAGNOSIS — M6281 Muscle weakness (generalized): Secondary | ICD-10-CM

## 2024-09-15 NOTE — Therapy (Incomplete)
 " OUTPATIENT PHYSICAL THERAPY CERVICAL TREATMENT   Patient Name: Janice Stewart MRN: [id number] DOB:1986-06-13, 39 y.o., female Today's Date: 07/29/2024  END OF SESSION:          Past Medical History:  Diagnosis Date   Anemia    Bone infarct (HCC)    S1 vertebrae   Diastasis recti 06/2023   Essential hypertension    Gestational choriocarcinoma (HCC) 2017   Headache    Left inguinal hernia 05/2023   Sickle cell trait    Stroke (HCC) 2017   around the time of chemotherapy   Umbilical hernia 05/2023   Past Surgical History:  Procedure Laterality Date   INSERTION OF MESH  07/25/2023   Procedure: INSERTION OF MESH;  Surgeon: Desiderio Schanz, MD;  Location: [facility] ORS;  Service: General;;   LIVER BIOPSY  2017   LUNG BIOPSY  2017   PORT-A-CATH REMOVAL  2018   PORTA CATH INSERTION  2017   ROBOT ASSISTED INGUINAL HERNIA REPAIR Left 06/20/2023   AND Creation of Left Posterior Rectus-Transversalis Fascia Advancment Flap for Coverage of Pelvic Wound (200 cm)   Patient Active Problem List   Diagnosis Date Noted   Diastasis recti 07/25/2023   Umbilical hernia without obstruction and without gangrene 07/25/2023   Left inguinal hernia 06/20/2023   Low back pain 03/26/2022   Bone infarct (HCC) 02/22/2022   Simple obesity 09/22/2021   Sickle cell trait 02/22/2020   Gestational choriocarcinoma (HCC) in 2017 01/25/2020   History of CVA (cerebrovascular accident) 01/25/2020    PCP: Jaycee Greig PARAS, NP   REFERRING PROVIDER: Trudy Duwaine BRAVO, NP   REFERRING DIAG:  M54.6 (ICD-10-CM) - Acute right-sided thoracic back pain  M79.18 (ICD-10-CM) - Myofascial pain syndrome  M41.9 (ICD-10-CM) - Scoliosis of thoracolumbar spine, unspecified scoliosis type    THERAPY DIAG:  No diagnosis found.  Rationale for Evaluation and Treatment: Rehabilitation  ONSET DATE: Chronic since 2017 with increase in pain the last 2-3 months  SUBJECTIVE:                                                                                                                                                                                                          SUBJECTIVE STATEMENT: Pt reports she went to an urgent care facility and received 2 injections, and anti-inflammatory and muscle relaxer medications and her neck is feeling much better. Pt reports her R interscapular pain can be minimal or severe depending on activity.  EVAL:Pt reports chronic R upper/mid back pain since 2017 with increase in pain the last 2-3 months.  Hand dominance: Right  PERTINENT HISTORY:  [place] 2017; [place] 2017-affecting R side as per pt  PAIN:  Are you having pain? Yes: NPRS scale: Pain range: 2-10/10. Current: 0/10; 10/10 after fixing supper last night Pain location: R upper/mid back Pain description: Sharp, N/T in area  Aggravating factors: prolonged standing, physical and mental stress, increased use of R arm Relieving factors: Pain medications, lidocaine  patch  PRECAUTIONS: None  RED FLAGS: None     WEIGHT BEARING RESTRICTIONS: No  FALLS:  Has patient fallen in last 6 months? No  LIVING ENVIRONMENT: Lives with: lives with their family Lives in: House/apartment Able to access home  OCCUPATION: Home maker  PLOF: Independent  PATIENT GOALS: Less pain and to be more active  OBJECTIVE:  Note: Objective measures were completed at Evaluation unless otherwise noted.  DIAGNOSTIC FINDINGS:  IMPRESSION: 1. No acute intracranial abnormality. 2. C5-C6 small central disc protrusion without central spinal canal or neural foraminal stenosis. 3. Incidental 1.6 cm left thyroid  nodule; dedicated outpatient thyroid  ultrasound recommended.  AP and lateral radiographs of thoracic spine show rightward curvature.  Normal thoracic kyphosis. Well preserved disc spacing. No fractures or  dislocations.   MRI 07/29/24 IMPRESSION: 1. No acute findings in the thoracic spine. 2. Rightward curvature of the mid thoracic  spine centered at T7. 3. 2.3 cm left thyroid  nodule, for which non-emergent thyroid  ultrasound is recommended.   PATIENT SURVEYS:  Modified Oswestry: 20/50=40%  Interpretation of scores: Score Category Description  0-20% Minimal Disability The patient can cope with most living activities. Usually no treatment is indicated apart from advice on lifting, sitting and exercise  21-40% Moderate Disability The patient experiences more pain and difficulty with sitting, lifting and standing. Travel and social life are more difficult and they may be disabled from work. Personal care, sexual activity and sleeping are not grossly affected, and the patient can usually be managed by conservative means  41-60% Severe Disability Pain remains the main problem in this group, but activities of daily living are affected. These patients require a detailed investigation  61-80% Crippled Back pain impinges on all aspects of the patients life. Positive intervention is required  81-100% Bed-bound These patients are either bed-bound or exaggerating their symptoms  Bluford FORBES Zoe DELENA Karon DELENA, et al. Surgery versus conservative management of stable thoracolumbar fracture: the PRESTO feasibility RCT. [place] ([place]): [employer]; 2021 Nov. [facility] Technology Assessment, No. 25.62.) Appendix 3, Oswestry Disability Index category descriptors. Available from: [email or web address].cz  Minimally Clinically Important Difference (MCID) = 12.8%  COGNITION: Overall cognitive status: Within functional limits for tasks assessed  SENSATION: WFL  POSTURE: rounded shoulders, forward head, and reverse S scoliosis c R rib hump  PALPATION: TTP c increased muscle tension of the R interscapular and paraspinal muscles, and r upper trap and levator   CERVICAL ROM:  WNLs Active ROM A/PROM (deg) eval  Flexion   Extension   Right lateral flexion   Left lateral flexion [place] R cervical and  mid back  Right rotation   Left rotation [place] R cervical and mid back   (Blank rows = not tested)  UPPER EXTREMITY ROM:  WNLs Active ROM Right eval Left eval  Shoulder flexion    Shoulder extension    Shoulder abduction    Shoulder adduction    Shoulder extension    Shoulder internal rotation    Shoulder external rotation    Elbow flexion    Elbow extension    Wrist flexion    Wrist  extension    Wrist ulnar deviation    Wrist radial deviation    Wrist pronation    Wrist supination     (Blank rows = not tested)  UPPER EXTREMITY MMT:  MMT Right eval Left eval  Shoulder flexion 4+ pain 5  Shoulder extension    Shoulder abduction    Shoulder adduction    Shoulder extension    Shoulder internal rotation    Shoulder external rotation    Middle trapezius 4 pain 5  Lower trapezius    Elbow flexion    Elbow extension    Wrist flexion    Wrist extension    Wrist ulnar deviation    Wrist radial deviation    Wrist pronation    Wrist supination    Grip strength 40 seemingly due to CVA 80   (Blank rows = not tested)  CERVICAL SPECIAL TESTS:  Spurling's test: Negative   TREATMENT DATE:  [facility] Adult PT Treatment:                                                DATE: 09/16/24 Therapeutic Exercise: Standing Chin tucks x10 3 Standing shoulder ER 2x10 GTB Standing shoulder horz abd 2x10 GTB Standing shoulder post roll x10 5# Cat Cow  x10 5 Child's Pose Stretch fwd/lat  6 reps - 20 hold Open book x5 5 Manual Therapy: *** Neuromuscular re-ed: *** Therapeutic Activity: *** Modalities: *** Self Care: ***  [facility] Adult PT Treatment:                                                DATE: 09/04/24 Therapeutic Exercise: Standing Chin tucks x10 3 Standing shoulder ER 2x10 GTB Standing shoulder horz abd 2x10 GTB Standing shoulder post roll x10 5# Cat Cow  x10 5 Child's Pose Stretch fwd/lat  6 reps - 20 hold Open book x5 5  [facility] Adult PT Treatment:                                                 DATE: 08/26/24 Manual Therapy: STM the R interscapular paraspinals, rhomboids, and mid trap   Light pressure suboccipital release Attempted manual cervical traction but discontinued with pt fearful of increased pain Therapeutic Exercise: Supine small range chin tucks on pillow Seated small range chin tucks  Seated shoulder roll Modalities: MH to the R mid back and upper shoulder, and neck   [facility] Adult PT Treatment:                                                DATE: 08/21/24 Manual Therapy: STM c TPR to the R interscapular paraspinals, rhomboids, and mid trap   Therapeutic Exercise: UBE L1 1.5 mins in each direction Chin tucks x10 3 Cat Cow  x8 5 Quadruped Thoracic Rotation - Reach Under x5 20 Child's Pose Stretch  5 reps - 20 hold Standing shoulder x5 Standing Shoulder Row GTB x15 Shoulder Extension GTB  x15 Open book x5 5 Modalities: MH to the R mid back and upper shoudler                                                                                                                                PATIENT EDUCATION:  Education details: Eval findings, POC, HEP, self care Person educated: Patient Education method: Explanation, Demonstration, Tactile cues, Verbal cues, and Handouts Education comprehension: verbalized understanding, returned demonstration, verbal cues required, and tactile cues required  HOME EXERCISE PROGRAM: Access Code: [email or web address] URL: [email or web address] Date: 09/04/2024 Prepared by: Dasie Daft  Exercises - Standing Quadratus Lumborum Stretch with Doorway  - 1-2 x daily - 7 x weekly - 1 sets - 3 reps - 30 hold - Standing Cervical Retraction  - 3 x daily - 7 x weekly - 1 sets - 10 reps - 3 hold - Cat Cow  - 1-2 x daily - 7 x weekly - 1 sets - 10 reps - 5 hold - Child's Pose Stretch  - 1-2 x daily - 7 x weekly - 1 sets - 3-5 reps - 20 hold - Standing Thoracic Open Book at Wall  - 1 x daily - 7 x weekly - 1  sets - 10 reps - 5 hold - Shoulder Extension with Resistance  - 1 x daily - 7 x weekly - 2-3 sets - 10 reps - 3 hold - Standing Shoulder Horizontal Abduction with Resistance  - 1 x daily - 7 x weekly - 2-3 sets - 5 reps - 3 hold - Shoulder External Rotation and Scapular Retraction with Resistance  - 1 x daily - 7 x weekly - 3 sets - 10 reps - 3 hold - Quadruped Thoracic Rotation - Reach Under  - 1-2 x daily - 7 x weekly - 1 sets - 3-5 reps - 20 hold  ASSESSMENT:  CLINICAL IMPRESSION: Pt's acute development of neck pain last week is much better after injections and receiving anti-inflammatory and muscle relaxer medications. PT today was completed for postural and periscapular strengthening, and for thoracic mobility. Advised pt to return to her HEP and use the tennis ball for massage to address her interscapular pain. Pt tolerated today's PT session with min increase in inter-scapular pain. Pt will continue to benefit from skilled PT to address impairments for improved function les less pain.   EVAL: Patient is a 39 y.o. female who was seen today for physical therapy evaluation and treatment for  M54.6 (ICD-10-CM) - Acute right-sided thoracic back pain  M79.18 (ICD-10-CM) - Myofascial pain syndrome  M41.9 (ICD-10-CM) - Scoliosis of thoracolumbar spine, unspecified scoliosis type  Pt presents to PT with upper/mid back pain which seems related to the reverse S scoliosis c r rib hump. Additionally, the R UE is weaker than the L seemly due to a CVA in 2017 affecting her R side.  OBJECTIVE IMPAIRMENTS: decreased strength, increased muscle spasms, impaired UE functional use, postural dysfunction, and  pain.   ACTIVITY LIMITATIONS: carrying, lifting, bathing, dressing, reach over head, and caring for others  PARTICIPATION LIMITATIONS: meal prep, cleaning, and laundry  PERSONAL FACTORS: Past/current experiences and Time since onset of injury/illness/exacerbation are also affecting patient's functional  outcome.   REHAB POTENTIAL: Good  CLINICAL DECISION MAKING: Evolving/moderate complexity  EVALUATION COMPLEXITY: Moderate   GOALS:   SHORT TERM GOALS: Target date: 08/21/24  Pt will be Ind in an initial HEP  Baseline: started Goal status: ONGOING  2.  Pt will report 25% or greater improvement in R upper/mid back with daily activities Baseline: 2-10/10 Goal status: ONGOING   LONG TERM GOALS: Target date: 09/18/24  Pt will be Ind in a final HEP to maintain achieved LOF  Baseline: satrted Goal status: INITIAL  2.  Pt will report 75% or greater improvement in R upper/mid back with daily activities for improved function and QOL Baseline:  Goal status: INITIAL  3.  Pt will be able to lift 5# 10x to overhead self c only min increase in pain as demonstration of improved function of the R UE for household activities Baseline: TBA Goal status: INITIAL  4.  Pt's Mod oswestry score will improve by the MCID to 27% of better as indication of improved function  Baseline: 40% Goal status: INITIAL   PLAN:  PT FREQUENCY: 2x/week  PT DURATION: 6 weeks  PLANNED INTERVENTIONS: 97164- PT Re-evaluation, 97110-Therapeutic exercises, 97530- Therapeutic activity, [id number]- Neuromuscular re-education, 97535- Self Care, [id number]- Manual therapy, [id number]- Gait training, [phone]- Electrical stimulation (unattended), 20560 (1-2 muscles), 20561 (3+ muscles)- Dry Needling, Patient/Family education, Taping, Joint mobilization, Spinal mobilization, Cryotherapy, and Moist heat  PLAN FOR NEXT SESSION: Assess UE lifting to overhead self; assess response to HEP; progress therex as indicated; use of modalities, manual therapy; and TPDN as indicated.  Masa Lubin MS, PT 09/15/24 10:47 AM      "

## 2024-09-16 ENCOUNTER — Ambulatory Visit

## 2024-12-21 ENCOUNTER — Ambulatory Visit: Admitting: "Endocrinology

## 2025-01-28 ENCOUNTER — Encounter: Admitting: Family

## 2025-02-02 ENCOUNTER — Ambulatory Visit: Admitting: Neurology
